# Patient Record
Sex: Female | Born: 1935 | Race: White | Hispanic: No | Marital: Married | Smoking: Never smoker
Health system: Southern US, Community
[De-identification: ages and names within clinical notes are randomized; demographics above are authoritative.]

## PROBLEM LIST (undated history)

## (undated) DIAGNOSIS — I10 Essential (primary) hypertension: Secondary | ICD-10-CM

## (undated) DIAGNOSIS — M1990 Unspecified osteoarthritis, unspecified site: Secondary | ICD-10-CM

## (undated) DIAGNOSIS — E785 Hyperlipidemia, unspecified: Secondary | ICD-10-CM

## (undated) HISTORY — PX: APPENDECTOMY: SHX54

## (undated) HISTORY — PX: TONSILLECTOMY: SUR1361

## (undated) HISTORY — DX: Hyperlipidemia, unspecified: E78.5

---

## 1999-01-03 ENCOUNTER — Encounter: Payer: Self-pay | Admitting: General Surgery

## 1999-01-03 ENCOUNTER — Ambulatory Visit (HOSPITAL_BASED_OUTPATIENT_CLINIC_OR_DEPARTMENT_OTHER): Admission: RE | Admit: 1999-01-03 | Discharge: 1999-01-03 | Payer: Self-pay | Admitting: General Surgery

## 2010-08-14 ENCOUNTER — Other Ambulatory Visit: Payer: Self-pay | Admitting: Family Medicine

## 2010-08-14 ENCOUNTER — Other Ambulatory Visit
Admission: RE | Admit: 2010-08-14 | Discharge: 2010-08-14 | Payer: Self-pay | Source: Home / Self Care | Admitting: Family Medicine

## 2011-08-08 DIAGNOSIS — G510 Bell's palsy: Secondary | ICD-10-CM | POA: Diagnosis not present

## 2011-08-22 DIAGNOSIS — G510 Bell's palsy: Secondary | ICD-10-CM | POA: Diagnosis not present

## 2011-09-11 DIAGNOSIS — Z1231 Encounter for screening mammogram for malignant neoplasm of breast: Secondary | ICD-10-CM | POA: Diagnosis not present

## 2011-09-14 DIAGNOSIS — M949 Disorder of cartilage, unspecified: Secondary | ICD-10-CM | POA: Diagnosis not present

## 2011-09-14 DIAGNOSIS — R7301 Impaired fasting glucose: Secondary | ICD-10-CM | POA: Diagnosis not present

## 2011-09-14 DIAGNOSIS — I10 Essential (primary) hypertension: Secondary | ICD-10-CM | POA: Diagnosis not present

## 2011-09-14 DIAGNOSIS — M839 Adult osteomalacia, unspecified: Secondary | ICD-10-CM | POA: Diagnosis not present

## 2011-09-14 DIAGNOSIS — M899 Disorder of bone, unspecified: Secondary | ICD-10-CM | POA: Diagnosis not present

## 2011-09-14 DIAGNOSIS — E559 Vitamin D deficiency, unspecified: Secondary | ICD-10-CM | POA: Diagnosis not present

## 2011-09-14 DIAGNOSIS — L2089 Other atopic dermatitis: Secondary | ICD-10-CM | POA: Diagnosis not present

## 2011-09-14 DIAGNOSIS — IMO0001 Reserved for inherently not codable concepts without codable children: Secondary | ICD-10-CM | POA: Diagnosis not present

## 2011-09-14 DIAGNOSIS — E782 Mixed hyperlipidemia: Secondary | ICD-10-CM | POA: Diagnosis not present

## 2011-09-18 DIAGNOSIS — I10 Essential (primary) hypertension: Secondary | ICD-10-CM | POA: Diagnosis not present

## 2011-09-18 DIAGNOSIS — Z1331 Encounter for screening for depression: Secondary | ICD-10-CM | POA: Diagnosis not present

## 2011-09-18 DIAGNOSIS — E782 Mixed hyperlipidemia: Secondary | ICD-10-CM | POA: Diagnosis not present

## 2011-09-18 DIAGNOSIS — E559 Vitamin D deficiency, unspecified: Secondary | ICD-10-CM | POA: Diagnosis not present

## 2011-09-18 DIAGNOSIS — R7301 Impaired fasting glucose: Secondary | ICD-10-CM | POA: Diagnosis not present

## 2011-09-18 DIAGNOSIS — Z0000 Encounter for general adult medical examination without abnormal findings: Secondary | ICD-10-CM | POA: Diagnosis not present

## 2011-09-18 DIAGNOSIS — M899 Disorder of bone, unspecified: Secondary | ICD-10-CM | POA: Diagnosis not present

## 2011-09-18 DIAGNOSIS — M949 Disorder of cartilage, unspecified: Secondary | ICD-10-CM | POA: Diagnosis not present

## 2011-11-13 DIAGNOSIS — Z23 Encounter for immunization: Secondary | ICD-10-CM | POA: Diagnosis not present

## 2012-04-03 DIAGNOSIS — Z23 Encounter for immunization: Secondary | ICD-10-CM | POA: Diagnosis not present

## 2012-04-10 DIAGNOSIS — M899 Disorder of bone, unspecified: Secondary | ICD-10-CM | POA: Diagnosis not present

## 2012-04-10 DIAGNOSIS — Z8262 Family history of osteoporosis: Secondary | ICD-10-CM | POA: Diagnosis not present

## 2012-09-10 DIAGNOSIS — E782 Mixed hyperlipidemia: Secondary | ICD-10-CM | POA: Diagnosis not present

## 2012-09-10 DIAGNOSIS — M899 Disorder of bone, unspecified: Secondary | ICD-10-CM | POA: Diagnosis not present

## 2012-09-10 DIAGNOSIS — I10 Essential (primary) hypertension: Secondary | ICD-10-CM | POA: Diagnosis not present

## 2012-09-10 DIAGNOSIS — Z1331 Encounter for screening for depression: Secondary | ICD-10-CM | POA: Diagnosis not present

## 2012-09-10 DIAGNOSIS — E559 Vitamin D deficiency, unspecified: Secondary | ICD-10-CM | POA: Diagnosis not present

## 2012-09-10 DIAGNOSIS — Z0000 Encounter for general adult medical examination without abnormal findings: Secondary | ICD-10-CM | POA: Diagnosis not present

## 2012-09-10 DIAGNOSIS — R7301 Impaired fasting glucose: Secondary | ICD-10-CM | POA: Diagnosis not present

## 2012-09-10 DIAGNOSIS — M949 Disorder of cartilage, unspecified: Secondary | ICD-10-CM | POA: Diagnosis not present

## 2012-09-18 DIAGNOSIS — E782 Mixed hyperlipidemia: Secondary | ICD-10-CM | POA: Diagnosis not present

## 2012-09-18 DIAGNOSIS — Z0000 Encounter for general adult medical examination without abnormal findings: Secondary | ICD-10-CM | POA: Diagnosis not present

## 2012-09-18 DIAGNOSIS — M899 Disorder of bone, unspecified: Secondary | ICD-10-CM | POA: Diagnosis not present

## 2012-09-18 DIAGNOSIS — I10 Essential (primary) hypertension: Secondary | ICD-10-CM | POA: Diagnosis not present

## 2012-09-18 DIAGNOSIS — R7301 Impaired fasting glucose: Secondary | ICD-10-CM | POA: Diagnosis not present

## 2012-09-18 DIAGNOSIS — M949 Disorder of cartilage, unspecified: Secondary | ICD-10-CM | POA: Diagnosis not present

## 2012-09-18 DIAGNOSIS — Z1211 Encounter for screening for malignant neoplasm of colon: Secondary | ICD-10-CM | POA: Diagnosis not present

## 2012-09-18 DIAGNOSIS — Z1331 Encounter for screening for depression: Secondary | ICD-10-CM | POA: Diagnosis not present

## 2012-09-18 DIAGNOSIS — E559 Vitamin D deficiency, unspecified: Secondary | ICD-10-CM | POA: Diagnosis not present

## 2012-09-18 DIAGNOSIS — Z01419 Encounter for gynecological examination (general) (routine) without abnormal findings: Secondary | ICD-10-CM | POA: Diagnosis not present

## 2012-12-29 DIAGNOSIS — Z0000 Encounter for general adult medical examination without abnormal findings: Secondary | ICD-10-CM | POA: Diagnosis not present

## 2012-12-29 DIAGNOSIS — Z1331 Encounter for screening for depression: Secondary | ICD-10-CM | POA: Diagnosis not present

## 2012-12-29 DIAGNOSIS — M899 Disorder of bone, unspecified: Secondary | ICD-10-CM | POA: Diagnosis not present

## 2012-12-29 DIAGNOSIS — R7301 Impaired fasting glucose: Secondary | ICD-10-CM | POA: Diagnosis not present

## 2012-12-29 DIAGNOSIS — M949 Disorder of cartilage, unspecified: Secondary | ICD-10-CM | POA: Diagnosis not present

## 2012-12-29 DIAGNOSIS — Z1211 Encounter for screening for malignant neoplasm of colon: Secondary | ICD-10-CM | POA: Diagnosis not present

## 2012-12-29 DIAGNOSIS — E782 Mixed hyperlipidemia: Secondary | ICD-10-CM | POA: Diagnosis not present

## 2012-12-29 DIAGNOSIS — E559 Vitamin D deficiency, unspecified: Secondary | ICD-10-CM | POA: Diagnosis not present

## 2012-12-29 DIAGNOSIS — I10 Essential (primary) hypertension: Secondary | ICD-10-CM | POA: Diagnosis not present

## 2012-12-30 DIAGNOSIS — H2510 Age-related nuclear cataract, unspecified eye: Secondary | ICD-10-CM | POA: Diagnosis not present

## 2013-04-07 DIAGNOSIS — Z23 Encounter for immunization: Secondary | ICD-10-CM | POA: Diagnosis not present

## 2013-09-16 ENCOUNTER — Inpatient Hospital Stay (HOSPITAL_COMMUNITY)
Admission: EM | Admit: 2013-09-16 | Discharge: 2013-09-20 | DRG: 482 | Disposition: A | Discharge status: Skilled Nursing Facility | Payer: Medicare Other | Attending: Internal Medicine | Admitting: Internal Medicine

## 2013-09-16 ENCOUNTER — Emergency Department (HOSPITAL_COMMUNITY): Payer: Medicare Other

## 2013-09-16 ENCOUNTER — Encounter (HOSPITAL_COMMUNITY): Payer: Self-pay | Admitting: Emergency Medicine

## 2013-09-16 DIAGNOSIS — M79609 Pain in unspecified limb: Secondary | ICD-10-CM | POA: Diagnosis not present

## 2013-09-16 DIAGNOSIS — E559 Vitamin D deficiency, unspecified: Secondary | ICD-10-CM | POA: Diagnosis not present

## 2013-09-16 DIAGNOSIS — W010XXA Fall on same level from slipping, tripping and stumbling without subsequent striking against object, initial encounter: Secondary | ICD-10-CM | POA: Diagnosis not present

## 2013-09-16 DIAGNOSIS — I10 Essential (primary) hypertension: Secondary | ICD-10-CM | POA: Diagnosis present

## 2013-09-16 DIAGNOSIS — S8990XA Unspecified injury of unspecified lower leg, initial encounter: Secondary | ICD-10-CM | POA: Diagnosis not present

## 2013-09-16 DIAGNOSIS — Z01811 Encounter for preprocedural respiratory examination: Secondary | ICD-10-CM | POA: Diagnosis not present

## 2013-09-16 DIAGNOSIS — E785 Hyperlipidemia, unspecified: Secondary | ICD-10-CM | POA: Diagnosis not present

## 2013-09-16 DIAGNOSIS — I498 Other specified cardiac arrhythmias: Secondary | ICD-10-CM | POA: Diagnosis not present

## 2013-09-16 DIAGNOSIS — W19XXXA Unspecified fall, initial encounter: Secondary | ICD-10-CM | POA: Diagnosis not present

## 2013-09-16 DIAGNOSIS — S7290XD Unspecified fracture of unspecified femur, subsequent encounter for closed fracture with routine healing: Secondary | ICD-10-CM | POA: Diagnosis not present

## 2013-09-16 DIAGNOSIS — I519 Heart disease, unspecified: Secondary | ICD-10-CM | POA: Diagnosis present

## 2013-09-16 DIAGNOSIS — M1990 Unspecified osteoarthritis, unspecified site: Secondary | ICD-10-CM | POA: Diagnosis not present

## 2013-09-16 DIAGNOSIS — Y92009 Unspecified place in unspecified non-institutional (private) residence as the place of occurrence of the external cause: Secondary | ICD-10-CM | POA: Diagnosis not present

## 2013-09-16 DIAGNOSIS — Z9181 History of falling: Secondary | ICD-10-CM | POA: Diagnosis not present

## 2013-09-16 DIAGNOSIS — I517 Cardiomegaly: Secondary | ICD-10-CM | POA: Diagnosis not present

## 2013-09-16 DIAGNOSIS — D72829 Elevated white blood cell count, unspecified: Secondary | ICD-10-CM | POA: Diagnosis not present

## 2013-09-16 DIAGNOSIS — S72309A Unspecified fracture of shaft of unspecified femur, initial encounter for closed fracture: Secondary | ICD-10-CM | POA: Diagnosis not present

## 2013-09-16 DIAGNOSIS — IMO0002 Reserved for concepts with insufficient information to code with codable children: Secondary | ICD-10-CM | POA: Diagnosis not present

## 2013-09-16 DIAGNOSIS — M818 Other osteoporosis without current pathological fracture: Secondary | ICD-10-CM | POA: Diagnosis not present

## 2013-09-16 DIAGNOSIS — S7292XA Unspecified fracture of left femur, initial encounter for closed fracture: Secondary | ICD-10-CM

## 2013-09-16 DIAGNOSIS — Z7982 Long term (current) use of aspirin: Secondary | ICD-10-CM | POA: Diagnosis not present

## 2013-09-16 DIAGNOSIS — S7291XA Unspecified fracture of right femur, initial encounter for closed fracture: Secondary | ICD-10-CM | POA: Diagnosis present

## 2013-09-16 DIAGNOSIS — S72009A Fracture of unspecified part of neck of unspecified femur, initial encounter for closed fracture: Secondary | ICD-10-CM | POA: Diagnosis not present

## 2013-09-16 DIAGNOSIS — I4510 Unspecified right bundle-branch block: Secondary | ICD-10-CM | POA: Diagnosis not present

## 2013-09-16 DIAGNOSIS — S99919A Unspecified injury of unspecified ankle, initial encounter: Secondary | ICD-10-CM | POA: Diagnosis not present

## 2013-09-16 DIAGNOSIS — S7290XA Unspecified fracture of unspecified femur, initial encounter for closed fracture: Secondary | ICD-10-CM | POA: Diagnosis not present

## 2013-09-16 DIAGNOSIS — T148XXA Other injury of unspecified body region, initial encounter: Secondary | ICD-10-CM | POA: Diagnosis not present

## 2013-09-16 DIAGNOSIS — M6281 Muscle weakness (generalized): Secondary | ICD-10-CM | POA: Diagnosis not present

## 2013-09-16 DIAGNOSIS — R269 Unspecified abnormalities of gait and mobility: Secondary | ICD-10-CM | POA: Diagnosis not present

## 2013-09-16 DIAGNOSIS — M25559 Pain in unspecified hip: Secondary | ICD-10-CM | POA: Diagnosis not present

## 2013-09-16 DIAGNOSIS — Z4789 Encounter for other orthopedic aftercare: Secondary | ICD-10-CM | POA: Diagnosis not present

## 2013-09-16 HISTORY — DX: Essential (primary) hypertension: I10

## 2013-09-16 HISTORY — DX: Unspecified osteoarthritis, unspecified site: M19.90

## 2013-09-16 LAB — I-STAT CHEM 8, ED
BUN: 22 mg/dL (ref 6–23)
Calcium, Ion: 1.06 mmol/L — ABNORMAL LOW (ref 1.13–1.30)
Chloride: 103 mEq/L (ref 96–112)
Creatinine, Ser: 1 mg/dL (ref 0.50–1.10)
Glucose, Bld: 153 mg/dL — ABNORMAL HIGH (ref 70–99)
HCT: 45 % (ref 36.0–46.0)
Hemoglobin: 15.3 g/dL — ABNORMAL HIGH (ref 12.0–15.0)
Potassium: 4 mEq/L (ref 3.7–5.3)
SODIUM: 141 meq/L (ref 137–147)
TCO2: 27 mmol/L (ref 0–100)

## 2013-09-16 MED ORDER — FENTANYL CITRATE 0.05 MG/ML IJ SOLN
100.0000 ug | Freq: Once | INTRAMUSCULAR | Status: AC
  Administered 2013-09-16: 100 ug via INTRAVENOUS
  Filled 2013-09-16: qty 2

## 2013-09-16 NOTE — ED Notes (Signed)
Bed: [id number] Expected date:  Expected time:  Means of arrival:  Comments: EMS 46F Fall w/ Fracture

## 2013-09-16 NOTE — ED Provider Notes (Signed)
CSN: [id number]     Arrival date & time 09/16/13  2300 History   First MD Initiated Contact with Patient 09/16/13 2302     Chief Complaint  Patient presents with  . Fall  . Leg Injury    Left femur deformity     (Consider location/radiation/quality/duration/timing/severity/associated sxs/prior Treatment) Patient is a 78 y.o. female presenting with hip pain. The history is provided by the patient. No language interpreter was used.  Hip Pain This is a new problem. The current episode started less than 1 hour ago. The problem occurs constantly. The problem has not changed since onset.Pertinent negatives include no chest pain, no abdominal pain, no headaches and no shortness of breath. Nothing aggravates the symptoms. Nothing relieves the symptoms. She has tried nothing for the symptoms. The treatment provided no relief.    History reviewed. No pertinent past medical history. History reviewed. No pertinent past surgical history. History reviewed. No pertinent family history. History  Substance Use Topics  . Smoking status: Not on file  . Smokeless tobacco: Not on file  . Alcohol Use: No   OB History   Grav Para Term Preterm Abortions TAB SAB Ect Mult Living                 Review of Systems  Respiratory: Negative for shortness of breath.   Cardiovascular: Negative for chest pain.  Gastrointestinal: Negative for abdominal pain.  Neurological: Negative for headaches.  All other systems reviewed and are negative.      Allergies  Review of patient's allergies indicates not on file.  Home Medications  No current outpatient prescriptions on file. BP 162/87  Pulse 115  Temp(Src) 98.6 F (37 C) (Oral)  Resp 22  SpO2 99% Physical Exam  Constitutional: She is oriented to person, place, and time. She appears well-developed and well-nourished.  HENT:  Head: Normocephalic and atraumatic.  Mouth/Throat: Oropharynx is clear and moist.  Eyes: Conjunctivae are normal. Pupils are  equal, round, and reactive to light.  Neck: Normal range of motion. Neck supple.  Cardiovascular: Normal rate, regular rhythm and intact distal pulses.   Pulmonary/Chest: Effort normal and breath sounds normal. No respiratory distress. She has no wheezes. She has no rales.  Abdominal: Soft. Bowel sounds are normal. There is no tenderness. There is no rebound and no guarding.  Musculoskeletal: Normal range of motion.  Neurological: She is alert and oriented to person, place, and time.  Skin: Skin is warm and dry. She is not diaphoretic.  Psychiatric: She has a normal mood and affect.    ED Course  Procedures (including critical care time) Labs Review Labs Reviewed - No data to display Imaging Review No results found.  EKG Interpretation   None       MDM   Final diagnoses:  None     Date: 09/16/2013  Rate: 113  Rhythm: sinus tachycardia  QRS Axis: normal  Intervals: QT prolonged  ST/T Wave abnormalities: nonspecific ST changes  Conduction Disutrbances:right bundle branch block  Narrative Interpretation:   Old EKG Reviewed: none available    1230 am case d/w Dr. Charlann Boxerlin of orthopedics via phone.  Continue [place] traction, admit to medicine, muscle relaxants and pain meds keep NPO for OR in am.    Alejos Reinhardt Smitty CordsK Spring San-Rasch, MD 09/17/13 [id number]

## 2013-09-16 NOTE — ED Notes (Signed)
MD at bedside. 

## 2013-09-16 NOTE — ED Notes (Signed)
Patient arrives via EMS r/t falling in her living room Patient with obvious deformity to left femur (mid-shaft) Traction splint placed by EMS Patient on back board to assist in moving No LOC

## 2013-09-17 ENCOUNTER — Encounter (HOSPITAL_COMMUNITY): Payer: Medicare Other | Admitting: Anesthesiology

## 2013-09-17 ENCOUNTER — Encounter (HOSPITAL_COMMUNITY): Payer: Self-pay | Admitting: *Deleted

## 2013-09-17 ENCOUNTER — Encounter (HOSPITAL_COMMUNITY)
Admission: EM | Disposition: A | Discharge status: Skilled Nursing Facility | Payer: Self-pay | Source: Home / Self Care | Attending: Internal Medicine

## 2013-09-17 ENCOUNTER — Observation Stay (HOSPITAL_COMMUNITY): Payer: Medicare Other

## 2013-09-17 ENCOUNTER — Observation Stay (HOSPITAL_COMMUNITY): Payer: Medicare Other | Admitting: Anesthesiology

## 2013-09-17 DIAGNOSIS — Y92009 Unspecified place in unspecified non-institutional (private) residence as the place of occurrence of the external cause: Secondary | ICD-10-CM

## 2013-09-17 DIAGNOSIS — I517 Cardiomegaly: Secondary | ICD-10-CM

## 2013-09-17 DIAGNOSIS — W19XXXA Unspecified fall, initial encounter: Secondary | ICD-10-CM

## 2013-09-17 DIAGNOSIS — S7291XA Unspecified fracture of right femur, initial encounter for closed fracture: Secondary | ICD-10-CM | POA: Diagnosis present

## 2013-09-17 DIAGNOSIS — S7290XA Unspecified fracture of unspecified femur, initial encounter for closed fracture: Secondary | ICD-10-CM

## 2013-09-17 DIAGNOSIS — I4510 Unspecified right bundle-branch block: Secondary | ICD-10-CM | POA: Diagnosis present

## 2013-09-17 HISTORY — PX: ORIF FEMUR FRACTURE: SHX2119

## 2013-09-17 LAB — TYPE AND SCREEN
ABO/RH(D): A NEG
Antibody Screen: NEGATIVE

## 2013-09-17 LAB — CBC WITH DIFFERENTIAL/PLATELET
BASOS ABS: 0 10*3/uL (ref 0.0–0.1)
Basophils Relative: 0 % (ref 0–1)
Eosinophils Absolute: 0.2 10*3/uL (ref 0.0–0.7)
Eosinophils Relative: 2 % (ref 0–5)
HEMATOCRIT: 42.2 % (ref 36.0–46.0)
Hemoglobin: 14.4 g/dL (ref 12.0–15.0)
LYMPHS ABS: 2 10*3/uL (ref 0.7–4.0)
LYMPHS PCT: 17 % (ref 12–46)
MCH: 30.7 pg (ref 26.0–34.0)
MCHC: 34.1 g/dL (ref 30.0–36.0)
MCV: 90 fL (ref 78.0–100.0)
MONO ABS: 0.6 10*3/uL (ref 0.1–1.0)
Monocytes Relative: 5 % (ref 3–12)
Neutro Abs: 9.2 10*3/uL — ABNORMAL HIGH (ref 1.7–7.7)
Neutrophils Relative %: 76 % (ref 43–77)
PLATELETS: 264 10*3/uL (ref 150–400)
RBC: 4.69 MIL/uL (ref 3.87–5.11)
RDW: 12.8 % (ref 11.5–15.5)
WBC: 12 10*3/uL — AB (ref 4.0–10.5)

## 2013-09-17 LAB — SURGICAL PCR SCREEN
MRSA, PCR: NEGATIVE
Staphylococcus aureus: NEGATIVE

## 2013-09-17 LAB — CREATININE, SERUM
CREATININE: 0.81 mg/dL (ref 0.50–1.10)
GFR calc Af Amer: 80 mL/min — ABNORMAL LOW (ref >90)
GFR, EST NON AFRICAN AMERICAN: 69 mL/min — AB (ref >90)

## 2013-09-17 LAB — ABO/RH: ABO/RH(D): A NEG

## 2013-09-17 LAB — PROTIME-INR
INR: 0.87 (ref 0.00–1.49)
PROTHROMBIN TIME: 11.7 s (ref 11.6–15.2)

## 2013-09-17 LAB — CBC
HEMATOCRIT: 38.2 % (ref 36.0–46.0)
HEMOGLOBIN: 12.4 g/dL (ref 12.0–15.0)
MCH: 30 pg (ref 26.0–34.0)
MCHC: 32.5 g/dL (ref 30.0–36.0)
MCV: 92.3 fL (ref 78.0–100.0)
Platelets: 233 10*3/uL (ref 150–400)
RBC: 4.14 MIL/uL (ref 3.87–5.11)
RDW: 13.1 % (ref 11.5–15.5)
WBC: 8.6 10*3/uL (ref 4.0–10.5)

## 2013-09-17 SURGERY — OPEN REDUCTION INTERNAL FIXATION (ORIF) DISTAL FEMUR FRACTURE
Anesthesia: General | Site: Leg Upper | Laterality: Left

## 2013-09-17 MED ORDER — HYDROCODONE-ACETAMINOPHEN 5-325 MG PO TABS
1.0000 | ORAL_TABLET | Freq: Four times a day (QID) | ORAL | Status: DC | PRN
  Filled 2013-09-17 (×6): qty 2

## 2013-09-17 MED ORDER — POTASSIUM CHLORIDE 2 MEQ/ML IV SOLN
INTRAVENOUS | Status: DC
  Administered 2013-09-17 – 2013-09-18 (×2): via INTRAVENOUS
  Filled 2013-09-17 (×8): qty 1000

## 2013-09-17 MED ORDER — HYDROMORPHONE HCL PF 1 MG/ML IJ SOLN
1.0000 mg | Freq: Once | INTRAMUSCULAR | Status: AC
  Administered 2013-09-17: 1 mg via INTRAVENOUS
  Filled 2013-09-17: qty 1

## 2013-09-17 MED ORDER — METOCLOPRAMIDE HCL 10 MG PO TABS: 5.0000 mg | ORAL_TABLET | Freq: Three times a day (TID) | ORAL | Status: DC | PRN

## 2013-09-17 MED ORDER — METHOCARBAMOL 500 MG PO TABS: 500.0000 mg | ORAL_TABLET | Freq: Four times a day (QID) | ORAL | Status: DC | PRN

## 2013-09-17 MED ORDER — DEXAMETHASONE SODIUM PHOSPHATE 10 MG/ML IJ SOLN
INTRAMUSCULAR | Status: DC | PRN
  Administered 2013-09-17: 10 mg via INTRAVENOUS

## 2013-09-17 MED ORDER — HYDROMORPHONE HCL PF 2 MG/ML IJ SOLN
INTRAMUSCULAR | Status: AC
  Filled 2013-09-17: qty 1

## 2013-09-17 MED ORDER — PHENYLEPHRINE 40 MCG/ML (10ML) SYRINGE FOR IV PUSH (FOR BLOOD PRESSURE SUPPORT)
PREFILLED_SYRINGE | INTRAVENOUS | Status: AC
  Filled 2013-09-17: qty 10

## 2013-09-17 MED ORDER — METHOCARBAMOL 100 MG/ML IJ SOLN
1000.0000 mg | Freq: Once | INTRAMUSCULAR | Status: AC
  Administered 2013-09-17: 1000 mg via INTRAMUSCULAR
  Filled 2013-09-17: qty 10

## 2013-09-17 MED ORDER — DOCUSATE SODIUM 100 MG PO CAPS
100.0000 mg | ORAL_CAPSULE | Freq: Two times a day (BID) | ORAL | Status: DC
  Administered 2013-09-17 – 2013-09-20 (×6): 100 mg via ORAL

## 2013-09-17 MED ORDER — HYDROCODONE-ACETAMINOPHEN 5-325 MG PO TABS: 1.0000 | ORAL_TABLET | Freq: Four times a day (QID) | ORAL | Status: DC | PRN

## 2013-09-17 MED ORDER — MEPERIDINE HCL 50 MG/ML IJ SOLN: 6.2500 mg | INTRAMUSCULAR | Status: DC | PRN

## 2013-09-17 MED ORDER — HYDROMORPHONE HCL PF 1 MG/ML IJ SOLN
INTRAMUSCULAR | Status: DC | PRN
  Administered 2013-09-17: 1 mg via INTRAVENOUS

## 2013-09-17 MED ORDER — ACETAMINOPHEN 325 MG PO TABS: 650.0000 mg | ORAL_TABLET | Freq: Four times a day (QID) | ORAL | Status: DC | PRN

## 2013-09-17 MED ORDER — ALUM & MAG HYDROXIDE-SIMETH 200-200-20 MG/5ML PO SUSP: 30.0000 mL | ORAL | Status: DC | PRN

## 2013-09-17 MED ORDER — CEFAZOLIN SODIUM-DEXTROSE 2-3 GM-% IV SOLR
INTRAVENOUS | Status: AC
  Filled 2013-09-17: qty 50

## 2013-09-17 MED ORDER — LIDOCAINE HCL (PF) 2 % IJ SOLN
INTRAMUSCULAR | Status: DC | PRN
  Administered 2013-09-17: 20 mg via INTRADERMAL

## 2013-09-17 MED ORDER — DEXAMETHASONE SODIUM PHOSPHATE 10 MG/ML IJ SOLN
INTRAMUSCULAR | Status: AC
  Filled 2013-09-17: qty 1

## 2013-09-17 MED ORDER — LACTATED RINGERS IV SOLN: INTRAVENOUS | Status: DC

## 2013-09-17 MED ORDER — LACTATED RINGERS IV SOLN
INTRAVENOUS | Status: DC | PRN
  Administered 2013-09-17: 12:00:00 via INTRAVENOUS

## 2013-09-17 MED ORDER — FENTANYL CITRATE 0.05 MG/ML IJ SOLN
INTRAMUSCULAR | Status: AC
  Filled 2013-09-17: qty 2

## 2013-09-17 MED ORDER — FENTANYL CITRATE 0.05 MG/ML IJ SOLN: 25.0000 ug | INTRAMUSCULAR | Status: DC | PRN

## 2013-09-17 MED ORDER — MAGNESIUM CITRATE PO SOLN: 0.5000 | Freq: Once | ORAL | Status: AC | PRN

## 2013-09-17 MED ORDER — SUCCINYLCHOLINE CHLORIDE 20 MG/ML IJ SOLN
INTRAMUSCULAR | Status: DC | PRN
  Administered 2013-09-17: 100 mg via INTRAVENOUS

## 2013-09-17 MED ORDER — METOCLOPRAMIDE HCL 5 MG/ML IJ SOLN: 5.0000 mg | Freq: Three times a day (TID) | INTRAMUSCULAR | Status: DC | PRN

## 2013-09-17 MED ORDER — ONDANSETRON HCL 4 MG/2ML IJ SOLN: 4.0000 mg | Freq: Four times a day (QID) | INTRAMUSCULAR | Status: DC | PRN

## 2013-09-17 MED ORDER — ENOXAPARIN SODIUM 40 MG/0.4ML ~~LOC~~ SOLN
40.0000 mg | SUBCUTANEOUS | Status: DC
  Administered 2013-09-18 – 2013-09-20 (×3): 40 mg via SUBCUTANEOUS
  Filled 2013-09-17 (×4): qty 0.4

## 2013-09-17 MED ORDER — PROPOFOL 10 MG/ML IV BOLUS
INTRAVENOUS | Status: AC
  Filled 2013-09-17: qty 20

## 2013-09-17 MED ORDER — ONDANSETRON HCL 4 MG/2ML IJ SOLN
INTRAMUSCULAR | Status: DC | PRN
Start: 2013-09-17 — End: 2013-09-17
  Administered 2013-09-17: 4 mg via INTRAVENOUS

## 2013-09-17 MED ORDER — PROMETHAZINE HCL 25 MG/ML IJ SOLN: 6.2500 mg | INTRAMUSCULAR | Status: DC | PRN

## 2013-09-17 MED ORDER — HYDROMORPHONE HCL PF 1 MG/ML IJ SOLN
0.5000 mg | INTRAMUSCULAR | Status: DC | PRN
  Administered 2013-09-18: 1 mg via INTRAVENOUS
  Filled 2013-09-17: qty 1

## 2013-09-17 MED ORDER — METHOCARBAMOL 100 MG/ML IJ SOLN: 500.0000 mg | Freq: Four times a day (QID) | INTRAVENOUS | Status: DC | PRN

## 2013-09-17 MED ORDER — DEXAMETHASONE SODIUM PHOSPHATE 10 MG/ML IJ SOLN
10.0000 mg | Freq: Once | INTRAMUSCULAR | Status: AC
  Administered 2013-09-18: 10 mg via INTRAVENOUS
  Filled 2013-09-17: qty 1

## 2013-09-17 MED ORDER — PHENOL 1.4 % MT LIQD: 1.0000 | OROMUCOSAL | Status: DC | PRN

## 2013-09-17 MED ORDER — PROPOFOL 10 MG/ML IV BOLUS
INTRAVENOUS | Status: DC | PRN
  Administered 2013-09-17: 150 mg via INTRAVENOUS

## 2013-09-17 MED ORDER — CEFAZOLIN SODIUM-DEXTROSE 2-3 GM-% IV SOLR
INTRAVENOUS | Status: DC | PRN
  Administered 2013-09-17: 2 g via INTRAVENOUS

## 2013-09-17 MED ORDER — METHOCARBAMOL 100 MG/ML IJ SOLN
500.0000 mg | Freq: Four times a day (QID) | INTRAVENOUS | Status: DC | PRN
  Filled 2013-09-17: qty 5

## 2013-09-17 MED ORDER — MIDAZOLAM HCL 5 MG/5ML IJ SOLN
INTRAMUSCULAR | Status: DC | PRN
  Administered 2013-09-17: 2 mg via INTRAVENOUS

## 2013-09-17 MED ORDER — ONDANSETRON HCL 4 MG PO TABS: 4.0000 mg | ORAL_TABLET | Freq: Four times a day (QID) | ORAL | Status: DC | PRN

## 2013-09-17 MED ORDER — PHENYLEPHRINE HCL 10 MG/ML IJ SOLN
INTRAMUSCULAR | Status: DC | PRN
  Administered 2013-09-17 (×2): 80 ug via INTRAVENOUS

## 2013-09-17 MED ORDER — HYDROCODONE-ACETAMINOPHEN 5-325 MG PO TABS
1.0000 | ORAL_TABLET | Freq: Four times a day (QID) | ORAL | Status: DC | PRN
  Administered 2013-09-17: 1 via ORAL
  Administered 2013-09-18 – 2013-09-20 (×6): 2 via ORAL
  Filled 2013-09-17: qty 2

## 2013-09-17 MED ORDER — MIDAZOLAM HCL 2 MG/2ML IJ SOLN
INTRAMUSCULAR | Status: AC
  Filled 2013-09-17: qty 2

## 2013-09-17 MED ORDER — ENOXAPARIN SODIUM 40 MG/0.4ML ~~LOC~~ SOLN: 40.0000 mg | SUBCUTANEOUS | Status: DC

## 2013-09-17 MED ORDER — POLYETHYLENE GLYCOL 3350 17 G PO PACK
17.0000 g | PACK | Freq: Every day | ORAL | Status: DC | PRN
  Administered 2013-09-17: 17 g via ORAL

## 2013-09-17 MED ORDER — CEFAZOLIN SODIUM 1-5 GM-% IV SOLN
1.0000 g | Freq: Four times a day (QID) | INTRAVENOUS | Status: AC
  Administered 2013-09-17 – 2013-09-18 (×2): 1 g via INTRAVENOUS
  Filled 2013-09-17 (×2): qty 50

## 2013-09-17 MED ORDER — 0.9 % SODIUM CHLORIDE (POUR BTL) OPTIME
TOPICAL | Status: DC | PRN
  Administered 2013-09-17: 1000 mL

## 2013-09-17 MED ORDER — SODIUM CHLORIDE 0.9 % IV SOLN
INTRAVENOUS | Status: DC
  Administered 2013-09-17: 05:00:00 via INTRAVENOUS

## 2013-09-17 MED ORDER — MENTHOL 3 MG MT LOZG: 1.0000 | LOZENGE | OROMUCOSAL | Status: DC | PRN

## 2013-09-17 MED ORDER — CEFAZOLIN SODIUM-DEXTROSE 2-3 GM-% IV SOLR: 2.0000 g | Freq: Once | INTRAVENOUS | Status: DC

## 2013-09-17 MED ORDER — ACETAMINOPHEN 650 MG RE SUPP: 650.0000 mg | Freq: Four times a day (QID) | RECTAL | Status: DC | PRN

## 2013-09-17 MED ORDER — ONDANSETRON HCL 4 MG/2ML IJ SOLN
INTRAMUSCULAR | Status: AC
  Filled 2013-09-17: qty 2

## 2013-09-17 MED ORDER — FENTANYL CITRATE 0.05 MG/ML IJ SOLN
INTRAMUSCULAR | Status: DC | PRN
  Administered 2013-09-17: 100 ug via INTRAVENOUS

## 2013-09-17 MED ORDER — MORPHINE SULFATE 2 MG/ML IJ SOLN
0.5000 mg | INTRAMUSCULAR | Status: DC | PRN
  Administered 2013-09-17 (×2): 0.5 mg via INTRAVENOUS
  Filled 2013-09-17 (×2): qty 1

## 2013-09-17 SURGICAL SUPPLY — 48 items
ADH SKN CLS APL DERMABOND .7 (GAUZE/BANDAGES/DRESSINGS) ×1
BAG SPEC THK2 15X12 ZIP CLS (MISCELLANEOUS)
BAG ZIPLOCK 12X15 (MISCELLANEOUS) ×1 IMPLANT
BIT DRILL 4.3MMS DISTAL GRDTED (BIT) IMPLANT
BIT DRILL 5.3 (BIT) ×2 IMPLANT
BNDG COHESIVE 4X5 TAN STRL (GAUZE/BANDAGES/DRESSINGS) ×1 IMPLANT
BNDG GAUZE ELAST 4 BULKY (GAUZE/BANDAGES/DRESSINGS) ×1 IMPLANT
CLOSURE WOUND 1/2 X4 (GAUZE/BANDAGES/DRESSINGS) ×1
DERMABOND ADVANCED (GAUZE/BANDAGES/DRESSINGS) ×2
DERMABOND ADVANCED .7 DNX12 (GAUZE/BANDAGES/DRESSINGS) IMPLANT
DRILL 4.3MMS DISTAL GRADUATED (BIT) ×3
DRSG EMULSION OIL 3X16 NADH (GAUZE/BANDAGES/DRESSINGS) ×1 IMPLANT
DRSG MEPILEX BORDER 4X4 (GAUZE/BANDAGES/DRESSINGS) ×2 IMPLANT
DRSG MEPILEX BORDER 4X8 (GAUZE/BANDAGES/DRESSINGS) ×2 IMPLANT
DURAPREP 26ML APPLICATOR (WOUND CARE) ×3 IMPLANT
ELECT REM PT RETURN 9FT ADLT (ELECTROSURGICAL) ×3
ELECTRODE REM PT RTRN 9FT ADLT (ELECTROSURGICAL) ×1 IMPLANT
GLOVE BIOGEL PI IND STRL 7.5 (GLOVE) ×1 IMPLANT
GLOVE BIOGEL PI IND STRL 8 (GLOVE) ×1 IMPLANT
GLOVE BIOGEL PI INDICATOR 7.5 (GLOVE)
GLOVE BIOGEL PI INDICATOR 8 (GLOVE) ×2
GLOVE ECLIPSE 8.0 STRL XLNG CF (GLOVE) IMPLANT
GLOVE ORTHO TXT STRL SZ7.5 (GLOVE) ×6 IMPLANT
GLOVE SURG ORTHO 8.0 STRL STRW (GLOVE) ×1 IMPLANT
GOWN SPEC L3 XXLG W/TWL (GOWN DISPOSABLE) ×4 IMPLANT
GOWN STRL REUS W/TWL LRG LVL3 (GOWN DISPOSABLE) ×3 IMPLANT
GUIDEPIN 3.2X17.5 THRD DISP (PIN) ×2 IMPLANT
GUIDEWIRE BALL NOSE 100CM (WIRE) ×2 IMPLANT
KIT BASIN OR (CUSTOM PROCEDURE TRAY) ×3 IMPLANT
MANIFOLD NEPTUNE II (INSTRUMENTS) ×3 IMPLANT
NAIL TROCH ENTRY 9MMX36CM (Nail) ×2 IMPLANT
NS IRRIG 1000ML POUR BTL (IV SOLUTION) ×3 IMPLANT
PACK TOTAL JOINT (CUSTOM PROCEDURE TRAY) ×3 IMPLANT
POSITIONER SURGICAL ARM (MISCELLANEOUS) ×3 IMPLANT
SCREW ACE CORTICAL (Screw) ×3 IMPLANT
SCREW BN FT 75X6.5XST DRV (Screw) IMPLANT
SCREW BONE CORTICAL 5.0X42 (Screw) ×2 IMPLANT
SCREWDRIVER HEX TIP 3.5MM (MISCELLANEOUS) ×2 IMPLANT
SPONGE GAUZE 4X4 12PLY (GAUZE/BANDAGES/DRESSINGS) ×1 IMPLANT
STAPLER VISISTAT 35W (STAPLE) ×1 IMPLANT
STRIP CLOSURE SKIN 1/2X4 (GAUZE/BANDAGES/DRESSINGS) ×2 IMPLANT
SUT MNCRL AB 4-0 PS2 18 (SUTURE) ×1 IMPLANT
SUT VIC AB 1 CT1 36 (SUTURE) ×4 IMPLANT
SUT VIC AB 2-0 CT1 27 (SUTURE) ×6
SUT VIC AB 2-0 CT1 TAPERPNT 27 (SUTURE) ×2 IMPLANT
TOWEL OR 17X26 10 PK STRL BLUE (TOWEL DISPOSABLE) ×4 IMPLANT
TOWEL OR NON WOVEN STRL DISP B (DISPOSABLE) ×2 IMPLANT
WATER STERILE IRR 1500ML POUR (IV SOLUTION) ×1 IMPLANT

## 2013-09-17 NOTE — Transfer of Care (Signed)
Immediate Anesthesia Transfer of Care Note  Patient: Lisa SpanBarbara White  Procedure(s) Performed: Procedure(s) (LRB): OPEN REDUCTION INTERNAL FIXATION (ORIF) DISTAL FEMUR FRACTURE (Left)  Patient Location: PACU  Anesthesia Type: General  Level of Consciousness: sedated, patient cooperative and responds to stimulation  Airway & Oxygen Therapy: Patient Spontanous Breathing and Patient connected to face mask oxgen  Post-op Assessment: Report given to PACU RN and Post -op Vital signs reviewed and stable  Post vital signs: Reviewed and stable  Complications: No apparent anesthesia complications

## 2013-09-17 NOTE — ED Notes (Signed)
Patient back from radiology Ortho tech present to place patient in ortho bed with traction

## 2013-09-17 NOTE — ED Notes (Signed)
EDP at bedside  Patient and pt's husband made aware that she is to be NPO for surgery later on this AM--agree and v/u

## 2013-09-17 NOTE — Preoperative (Signed)
Beta Blockers   Reason not to administer Beta Blockers:Not Applicable 

## 2013-09-17 NOTE — Brief Op Note (Signed)
09/16/2013 - 09/17/2013  2:20 PM  PATIENT:  Lisa White  78 y.o. female  PRE-OPERATIVE DIAGNOSIS:  Left femur fracture, mid shaft   POST-OPERATIVE DIAGNOSIS:  Left femur fracture, mid shaft   PROCEDURE:  Procedure(s): OPEN REDUCTION INTERNAL FIXATION (ORIF) DISTAL FEMUR FRACTURE (Left) Intramedullary nail left femur, 9X3960mm, one proximal interlock, one distal interlock  SURGEON:  Surgeon(s) and Role:    * Shelda PalMatthew D Regan Mcbryar, MD - Primary  PHYSICIAN ASSISTANT: None  ASSISTANTS: Surgical team   ANESTHESIA:   general  EBL:  Total I/O In: 167.5 [I.V.:167.5] Out: 1100 [Urine:1000; Blood:100]  BLOOD ADMINISTERED:none  DRAINS: none   LOCAL MEDICATIONS USED:  NONE  SPECIMEN:  No Specimen  DISPOSITION OF SPECIMEN:  N/A  COUNTS:  YES  TOURNIQUET:  * No tourniquets in log *  DICTATION: .Other Dictation: Dictation Number [id number]  PLAN OF CARE: Admit to inpatient   PATIENT DISPOSITION:  PACU - hemodynamically stable.   Delay start of Pharmacological VTE agent (>24hrs) due to surgical blood loss or risk of bleeding: no

## 2013-09-17 NOTE — Progress Notes (Signed)
Echo Lab  2D Echocardiogram completed.  Lisa White L Tadd Holtmeyer, RDCS 09/17/2013 9:01 AM

## 2013-09-17 NOTE — Anesthesia Preprocedure Evaluation (Addendum)
Anesthesia Evaluation  Patient identified by MRN, date of birth, ID band Patient awake    Reviewed: Allergy & Precautions, H&P , NPO status , Patient's Chart, lab work & pertinent test results  Airway Mallampati: II TM Distance: >3 FB Neck ROM: Full    Dental no notable dental hx.    Pulmonary neg pulmonary ROS,  breath sounds clear to auscultation  Pulmonary exam normal       Cardiovascular hypertension, Pt. on medications + dysrhythmias Rhythm:Regular Rate:Tachycardia     Neuro/Psych negative neurological ROS  negative psych ROS   GI/Hepatic negative GI ROS, Neg liver ROS,   Endo/Other  negative endocrine ROS  Renal/GU negative Renal ROS     Musculoskeletal negative musculoskeletal ROS (+)   Abdominal   Peds  Hematology negative hematology ROS (+)   Anesthesia Other Findings   Reproductive/Obstetrics negative OB ROS                          Anesthesia Physical Anesthesia Plan  ASA: III  Anesthesia Plan: General   Post-op Pain Management:    Induction: Intravenous  Airway Management Planned: Oral ETT  Additional Equipment:   Intra-op Plan:   Post-operative Plan: Extubation in OR  Informed Consent: I have reviewed the patients History and Physical, chart, labs and discussed the procedure including the risks, benefits and alternatives for the proposed anesthesia with the patient or authorized representative who has indicated his/her understanding and acceptance.   Dental advisory given  Plan Discussed with: CRNA and Surgeon  Anesthesia Plan Comments:        Anesthesia Quick Evaluation

## 2013-09-17 NOTE — H&P (Signed)
PCP:   No primary provider on file.   Chief Complaint:  Left hip pain  HPI: 78 yo female who was trying to tie her shoe, twisted and fell and hurt her left leg.  She has broken her left femur.  Pt denies loc.  She is overall very healthy.  No cardiac issues.  No h/o cva or chf.  No copd.  Very active.  Pain well controlled at this time.  Review of Systems:  Positive and negative as per HPI otherwise all other systems are negative  Past Medical History: History reviewed. No pertinent past medical history. History reviewed. No pertinent past surgical history.  Medications: Prior to Admission medications   Medication Sig Start Date End Date Taking? Authorizing Provider  alendronate (FOSAMAX) 70 MG tablet Take 70 mg by mouth once a week. 07/23/13  Yes Historical Provider, MD  amLODipine (NORVASC) 10 MG tablet Take 10 mg by mouth at bedtime.    Yes Historical Provider, MD  aspirin EC 81 MG tablet Take 81 mg by mouth at bedtime.    Yes Historical Provider, MD  atorvastatin (LIPITOR) 20 MG tablet Take 20 mg by mouth at bedtime.  08/13/13  Yes Historical Provider, MD    Allergies:  No Known Allergies  Social History:  reports that she does not drink alcohol or use illicit drugs. Her tobacco history is not on file.  Family History: History reviewed. No pertinent family history.  Physical Exam: Filed Vitals:   09/17/13 0215 09/17/13 0230 09/17/13 0239 09/17/13 0318  BP:  128/60  173/78  Pulse: 101 95 84 102  Temp:    98 F (36.7 C)  TempSrc:      Resp: 13 26 16 16   Height:    5\' 5"  (1.651 m)  Weight:    74.3 kg (163 lb 12.8 oz)  SpO2: 98% 94% 99% 94%   General appearance: alert, cooperative and no distress Head: Normocephalic, without obvious abnormality, atraumatic Eyes: negative Nose: Nares normal. Septum midline. Mucosa normal. No drainage or sinus tenderness. Neck: no JVD and supple, symmetrical, trachea midline Lungs: clear to auscultation bilaterally Heart: regular  rate and rhythm, S1, S2 normal, no murmur, click, rub or gallop Abdomen: soft, non-tender; bowel sounds normal; no masses,  no organomegaly Extremities: extremities normal, atraumatic, no cyanosis or edema  lle traction Pulses: 2+ and symmetric Skin: Skin color, texture, turgor normal. No rashes or lesions Neurologic: Grossly normal    Labs on Admission:   Recent Labs  09/16/13 2350  NA 141  K 4.0  CL 103  GLUCOSE 153*  BUN 22  CREATININE 1.00    Recent Labs  09/16/13 2325 09/16/13 2350  WBC 12.0*  --   NEUTROABS 9.2*  --   HGB 14.4 15.3*  HCT 42.2 45.0  MCV 90.0  --   PLT 264  --     Radiological Exams on Admission: Dg Chest 1 View  09/17/2013   CLINICAL DATA:  78 year old female with left femur fracture. Preoperative respiratory examination.  EXAM: CHEST - 1 VIEW  COMPARISON:  None.  FINDINGS: The heart size and mediastinal contours are within normal limits. Both lungs are clear. The visualized skeletal structures are unremarkable.  IMPRESSION: No active disease.   Electronically Signed   By: Laveda AbbeJeff  Hu M.D.   On: 09/17/2013 00:53   Dg Hip Complete Left  09/17/2013   CLINICAL DATA:  Left leg injury and pain.  EXAM: LEFT HIP - COMPLETE 2+ VIEW  COMPARISON:  None  FINDINGS: An oblique fracture of the proximal -mid left femoral diaphysis is noted with displacement of 3 cm medially and 4.5 cm posteriorly. There is also 5 cm shortening.  There is no evidence of subluxation or dislocation.  No definite focal bony lesions are present.  IMPRESSION: Displaced oblique fracture of the proximal -mid left femur.   Electronically Signed   By: Laveda Abbe M.D.   On: 09/17/2013 00:37   Dg Femur Left  09/17/2013   CLINICAL DATA:  Fall with left leg pain.  EXAM: LEFT FEMUR - 2 VIEW  COMPARISON:  None.  FINDINGS: An oblique fracture of the proximal -mid left femoral diaphysis is noted with displacement of 3 cm medially and 4.5 cm posteriorly. There is also 5 cm shortening.  There is no evidence  of subluxation or dislocation.  IMPRESSION: Displaced fracture of the proximal -mid left femur.   Electronically Signed   By: Laveda Abbe M.D.   On: 09/17/2013 00:37   Dg Knee 1-2 Views Left  09/17/2013   CLINICAL DATA:  Fall  EXAM: LEFT KNEE - 1-2 VIEW  COMPARISON:  None.  FINDINGS: No acute fracture or dislocation. No joint effusion. Moderate joint space narrowing with juxta-articular osteophytosis present within the medial femorotibial joint space compartment. No soft tissue abnormality.  IMPRESSION: 1. No acute fracture or dislocation. 2. 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   Electronically Signed   By: Rise Mu M.D.   On: 09/17/2013 00:42    Assessment/Plan  78 yo female with new left femur fracture from fall with RBBB on ekg  Principal Problem:   Femur fracture, left-  Place on hip/femur fracture pathway.  Keep npo for surgery later today.  Active Problems:   RBBB- will obtain preop echo.  No h/o cardiac issues.  No prev ekg to c/w.     Fall at home    Yalissa Fink A 09/17/2013, 3:38 AM

## 2013-09-17 NOTE — Discharge Instructions (Signed)
Partial weight bearing left lower extremity until further directed  May shower just keep wounds dry

## 2013-09-17 NOTE — ED Notes (Signed)
Patient medicated for pain prior to being transported to radiology

## 2013-09-17 NOTE — Consult Note (Signed)
Reason for Consult: Left femur fracture Referring Physician:  Emergency room, MD  Lisa White is an 78 y.o. female.  HPI:  78yo female had unfortunate episode resulting an unusual stress that resulted in her femur fracturing.  No antecedent pains, no history of unexplained weight loss, no active malignancies, no fever chills or night sweats Immediate onset pain, deformity, inability to bear weight.  Transferred to [facility] ER for evaluation.  Admitted with left femur fracture  Past Medical History  Diagnosis Date  . Hypertension   . Arthritis     Past Surgical History  Procedure Laterality Date  . Tonsillectomy    . Appendectomy      History reviewed. No pertinent family history.  Social History:  reports that she has never smoked. She has never used smokeless tobacco. She reports that she does not drink alcohol or use illicit drugs.  Allergies: No Known Allergies  Medications: I have reviewed the patient's current medications. Scheduled:   Results for orders placed during the hospital encounter of 09/16/13 (from the past 24 hour(s))  CBC WITH DIFFERENTIAL     Status: Abnormal   Collection Time    09/16/13 11:25 PM      Result Value Ref Range   WBC 12.0 (*) 4.0 - 10.5 K/uL   RBC 4.69  3.87 - 5.11 MIL/uL   Hemoglobin 14.4  12.0 - 15.0 g/dL   HCT 16.1  09.6 - 04.5 %   MCV 90.0  78.0 - 100.0 fL   MCH 30.7  26.0 - 34.0 pg   MCHC 34.1  30.0 - 36.0 g/dL   RDW 40.9  81.1 - 91.4 %   Platelets 264  150 - 400 K/uL   Neutrophils Relative % 76  43 - 77 %   Neutro Abs 9.2 (*) 1.7 - 7.7 K/uL   Lymphocytes Relative 17  12 - 46 %   Lymphs Abs 2.0  0.7 - 4.0 K/uL   Monocytes Relative 5  3 - 12 %   Monocytes Absolute 0.6  0.1 - 1.0 K/uL   Eosinophils Relative 2  0 - 5 %   Eosinophils Absolute 0.2  0.0 - 0.7 K/uL   Basophils Relative 0  0 - 1 %   Basophils Absolute 0.0  0.0 - 0.1 K/uL  PROTIME-INR     Status: None   Collection Time    09/16/13 11:25 PM      Result Value  Ref Range   Prothrombin Time 11.7  11.6 - 15.2 seconds   INR 0.87  0.00 - 1.49  I-STAT CHEM 8, ED     Status: Abnormal   Collection Time    09/16/13 11:50 PM      Result Value Ref Range   Sodium 141  137 - 147 mEq/L   Potassium 4.0  3.7 - 5.3 mEq/L   Chloride 103  96 - 112 mEq/L   BUN 22  6 - 23 mg/dL   Creatinine, Ser 7.82  0.50 - 1.10 mg/dL   Glucose, Bld 956 (*) 70 - 99 mg/dL   Calcium, Ion 2.13 (*) 1.13 - 1.30 mmol/L   TCO2 27  0 - 100 mmol/L   Hemoglobin 15.3 (*) 12.0 - 15.0 g/dL   HCT 08.6  57.8 - 46.9 %  SURGICAL PCR SCREEN     Status: None   Collection Time    09/17/13  4:08 AM      Result Value Ref Range   MRSA, PCR NEGATIVE  NEGATIVE  Staphylococcus aureus NEGATIVE  NEGATIVE  TYPE AND SCREEN     Status: None   Collection Time    09/17/13  5:45 AM      Result Value Ref Range   ABO/RH(D) A NEG     Antibody Screen NEG     Sample Expiration 09/20/2013    ABO/RH     Status: None   Collection Time    09/17/13  5:45 AM      Result Value Ref Range   ABO/RH(D) A NEG      X-ray: CLINICAL DATA: Fall with left leg pain.  EXAM:  LEFT FEMUR - 2 VIEW  COMPARISON: None.  FINDINGS:  An oblique fracture of the proximal -mid left femoral diaphysis is  noted with displacement of 3 cm medially and 4.5 cm posteriorly.  There is also 5 cm shortening.  There is no evidence of subluxation or dislocation.  IMPRESSION:  Displaced fracture of the proximal -mid left femur.  Electronically Signed  By: Laveda AbbeJeff Hu M.D.   Review of Systems  Constitutional: Negative.   Eyes: Negative.   Respiratory: Negative.   Cardiovascular: Negative.   Gastrointestinal: Negative.   Genitourinary: Negative.   Skin: Negative.   Neurological: Negative.   Psychiatric/Behavioral: Negative.    Otherwise normal Reports that her right leg (contra lateral leg) occasionally gives way on her and that she was hoping to have it evaluated at least by primary doctor  Blood pressure 124/70, pulse 72,  temperature 98.5 F (36.9 C), temperature source Oral, resp. rate 16, height 5\' 5"  (1.651 m), weight 74.3 kg (163 lb 12.8 oz), SpO2 97.00%.  Physical Exam  Constitutional: She is oriented to person, place, and time. She appears well-developed and well-nourished.  HENT:  Head: Normocephalic.  Cardiovascular: Normal rate.   Respiratory: Effort normal and breath sounds normal. She has no wheezes.  GI: Soft. She exhibits no distension. There is no tenderness.  Neurological: She is alert and oriented to person, place, and time.   Left lower extremity in traction, thigh soft, NVI LLE Right lower extremity normal, no knee effusion  Assessment/Plan: Displaced left femur fracture NPO  To OR today for ORIF left femur Consent ordered Explained and reviewed fracture with her and her husband Will be PWB post operatively for 4-6 weeks DVT prophylaxis post operative  Kiernan Atkerson D 09/17/2013, 8:50 AM

## 2013-09-17 NOTE — Progress Notes (Signed)
[facility] PROGRESS NOTE  Lisa SpanBarbara White UJW:[id number]:[id number] DOB: 02-09-1936 DOA: 09/16/2013 PCP: No primary provider on file.  Assessment/Plan: 1. Left hip fracture. She apparently had a mechanical fall at home, losing her balance while tying her shoe. Patient not appearing to have acute cardiopulmonary issues, transthoracic echocardiogram showed an ejection fraction of 60-65%, taken to the OR on 09/17/2013 for ORIF. Physical therapy consult and social work consult.  2. Leukocytosis. Initial lab work showing a white count of 12,000, I suspect secondary to hip fracture. She does not appear to have acute infectious process.  Code Status: Full code Family Communication: I spoke with patient's husband present at bedside Disposition Plan: Plan to do to OR today   Consultants:  Orthopedic surgery  Procedures:  Orthopedic repair of left hip fracture   HPI/Subjective: Patient is a 78 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 She was unable to bear weight, in the scope brought [facility]. Imaging studies revealed a displaced oblique fracture of the proximal-mid left femur. She was admitted to the medicine service with orthopedic consultation. Patient having a transthoracic echocardiogram performed on 09/17/2013 given presence of right bundle branch block with no previous EKG to compare with. Transthoracic echocardiogram revealed ejection fraction of 60-65% without wall motion abnormalities. She did have grade 1 diastolic dysfunction. She does not appear to have active cardiopulmonary issues, cleared for surgery. She was taken to the OR on 09/17/2013 for ORIF.  Objective: Filed Vitals:   09/17/13 1148  BP: 158/79  Pulse: 89  Temp: 98.1 F (36.7 C)  Resp:     Intake/Output Summary (Last 24 hours) at 09/17/13 1413 Last data  filed at 09/17/13 1356  Gross per 24 hour  Intake  167.5 ml  Output   1100 ml  Net -932.5 ml   Filed Weights   09/17/13 0318  Weight: 74.3 kg (163 lb 12.8 oz)    Exam:   General:  Patient is in no acute shows, awake alert, cooperative, reports pain is well controlled.  Cardiovascular: Regular rate rhythm normal S1-S2  Respiratory: Clear to auscultation  Abdomen: Soft nontender nondistended  Musculoskeletal: Left lower extremity externally rotated, traction device in place.  Data Reviewed: Basic Metabolic Panel:  Recent Labs Lab 09/16/13 2350  NA 141  K 4.0  CL 103  GLUCOSE 153*  BUN 22  CREATININE 1.00   Liver Function Tests: No results found for this basename: AST, ALT, ALKPHOS, BILITOT, PROT, ALBUMIN,  in the last 168 hours No results found for this basename: LIPASE, AMYLASE,  in the last 168 hours No results found for this basename: AMMONIA,  in the last 168 hours CBC:  Recent Labs Lab 09/16/13 2325 09/16/13 2350  WBC 12.0*  --   NEUTROABS 9.2*  --   HGB 14.4 15.3*  HCT 42.2 45.0  MCV 90.0  --   PLT 264  --    Cardiac Enzymes: No results found for this basename: CKTOTAL, CKMB, CKMBINDEX, TROPONINI,  in the last 168 hours BNP (last 3 results) No results found for this basename: PROBNP,  in the last 8760 hours CBG: No results found for this basename: GLUCAP,  in the last 168 hours  Recent Results (from the past 240 hour(s))  SURGICAL PCR SCREEN     Status: None   Collection Time    09/17/13  4:08 AM  Result Value Ref Range Status   MRSA, PCR NEGATIVE  NEGATIVE Final   Staphylococcus aureus NEGATIVE  NEGATIVE Final   Comment:            The Xpert SA Assay (FDA     approved for NASAL specimens     in patients over 68 years of age),     is one component of     a comprehensive surveillance     program.  Test performance has     been validated by [employer] for patients greater     than or equal to 23 year old.     It is not intended      to diagnose infection nor to     guide or monitor treatment.     Studies: Dg Chest 1 View  09/17/2013   CLINICAL DATA:  78 year old female with left femur fracture. Preoperative respiratory examination.  EXAM: CHEST - 1 VIEW  COMPARISON:  None.  FINDINGS: The heart size and mediastinal contours are within normal limits. Both lungs are clear. The visualized skeletal structures are unremarkable.  IMPRESSION: No active disease.   Electronically Signed   By: Laveda Abbe M.D.   On: 09/17/2013 00:53   Dg Hip Complete Left  09/17/2013   CLINICAL DATA:  Left leg injury and pain.  EXAM: LEFT HIP - COMPLETE 2+ VIEW  COMPARISON:  None  FINDINGS: An oblique fracture of the proximal -mid left femoral diaphysis is noted with displacement of 3 cm medially and 4.5 cm posteriorly. There is also 5 cm shortening.  There is no evidence of subluxation or dislocation.  No definite focal bony lesions are present.  IMPRESSION: Displaced oblique fracture of the proximal -mid left femur.   Electronically Signed   By: Laveda Abbe M.D.   On: 09/17/2013 00:37   Dg Femur Left  09/17/2013   CLINICAL DATA:  Fall with left leg pain.  EXAM: LEFT FEMUR - 2 VIEW  COMPARISON:  None.  FINDINGS: An oblique fracture of the proximal -mid left femoral diaphysis is noted with displacement of 3 cm medially and 4.5 cm posteriorly. There is also 5 cm shortening.  There is no evidence of subluxation or dislocation.  IMPRESSION: Displaced fracture of the proximal -mid left femur.   Electronically Signed   By: Laveda Abbe M.D.   On: 09/17/2013 00:37   Dg Knee 1-2 Views Left  09/17/2013   CLINICAL DATA:  Fall  EXAM: LEFT KNEE - 1-2 VIEW  COMPARISON:  None.  FINDINGS: No acute fracture or dislocation. No joint effusion. Moderate joint space narrowing with juxta-articular osteophytosis present within the medial femorotibial joint space compartment. No soft tissue abnormality.  IMPRESSION: 1. No acute fracture or dislocation. 2. Moderate degenerative  osteoarthrosis within the medial femorotibial joint space compartment.   Electronically Signed   By: Rise Mu M.D.   On: 09/17/2013 00:42    Scheduled Meds:  Continuous Infusions: . sodium chloride 75 mL/hr at 09/17/13 1610    Principal Problem:   Femur fracture, left Active Problems:   RBBB   Fall at home    Time spent:35 minutes    Lisa White  [facility] Pager [phone]. If 7PM-7AM, please contact night-coverage at [email or web address], password [facility] 09/17/2013, 2:13 PM  LOS: 1 day

## 2013-09-18 ENCOUNTER — Encounter (HOSPITAL_COMMUNITY): Payer: Self-pay | Admitting: Orthopedic Surgery

## 2013-09-18 DIAGNOSIS — Y92009 Unspecified place in unspecified non-institutional (private) residence as the place of occurrence of the external cause: Secondary | ICD-10-CM

## 2013-09-18 DIAGNOSIS — I4510 Unspecified right bundle-branch block: Secondary | ICD-10-CM

## 2013-09-18 DIAGNOSIS — D72829 Elevated white blood cell count, unspecified: Secondary | ICD-10-CM

## 2013-09-18 DIAGNOSIS — W19XXXA Unspecified fall, initial encounter: Secondary | ICD-10-CM

## 2013-09-18 LAB — CBC
HCT: 28 % — ABNORMAL LOW (ref 36.0–46.0)
Hemoglobin: 9.5 g/dL — ABNORMAL LOW (ref 12.0–15.0)
MCH: 30.5 pg (ref 26.0–34.0)
MCHC: 33.9 g/dL (ref 30.0–36.0)
MCV: 90 fL (ref 78.0–100.0)
Platelets: 213 10*3/uL (ref 150–400)
RBC: 3.11 MIL/uL — AB (ref 3.87–5.11)
RDW: 12.8 % (ref 11.5–15.5)
WBC: 9 10*3/uL (ref 4.0–10.5)

## 2013-09-18 LAB — BASIC METABOLIC PANEL
BUN: 16 mg/dL (ref 6–23)
CO2: 24 mEq/L (ref 19–32)
Calcium: 7.7 mg/dL — ABNORMAL LOW (ref 8.4–10.5)
Chloride: 105 mEq/L (ref 96–112)
Creatinine, Ser: 0.85 mg/dL (ref 0.50–1.10)
GFR calc Af Amer: 76 mL/min — ABNORMAL LOW (ref >90)
GFR calc non Af Amer: 65 mL/min — ABNORMAL LOW (ref >90)
Glucose, Bld: 161 mg/dL — ABNORMAL HIGH (ref 70–99)
POTASSIUM: 4.8 meq/L (ref 3.7–5.3)
SODIUM: 138 meq/L (ref 137–147)

## 2013-09-18 MED ORDER — DSS 100 MG PO CAPS: 100.0000 mg | ORAL_CAPSULE | Freq: Two times a day (BID) | ORAL | Status: DC

## 2013-09-18 MED ORDER — ALUM & MAG HYDROXIDE-SIMETH 200-200-20 MG/5ML PO SUSP: 30.0000 mL | ORAL | Status: AC | PRN

## 2013-09-18 NOTE — Op Note (Signed)
NAMBradly Chris:  Bera, Labria             ACCOUNT NO.:  [id number]  MEDICAL RECORD NO.:  [id number]  LOCATION:  1616                         [facility]  PHYSICIAN:  Madlyn FrankelMatthew D. Charlann Boxerlin, M.D.  DATE OF BIRTH:  03/09/1936  DATE OF PROCEDURE:  09/17/2013 DATE OF DISCHARGE:                              OPERATIVE REPORT   PREOPERATIVE DIAGNOSIS:  Mid shaft left femur fracture.  POSTOPERATIVE DIAGNOSIS:  Mid shaft left femur fracture.  PROCEDURE:  Open reduction and internal fixation of the left femur fracture utilizing an intramedullary nail technique.  A Biomet VersaNail trochanter nail was utilized measuring 9 mm x 360 mm with proximal interlocks from the greater to the lesser trochanter, and 1 distal interlock.  SURGEON:  Madlyn FrankelMatthew D. Charlann Boxerlin, M.D.  ASSISTANT:  Surgical team.  ANESTHESIA:  General.  SPECIMENS:  None.  COMPLICATION:  None.  BLOOD LOSS:  Less than 100 mL.  INDICATION FOR THE PROCEDURE:  Ms. Lisa White is a 78 year old female, who had an unfortunate fall at her house with immediate onset of pain, deformity, inability to bear weight.  [facility].  Radiographs revealed a displaced mid shaft femur fracture.  She was admitted to the medical service per fracture pathway protocol.  Orthopedics consulted for management.  She was seen and evaluated with her husband in the hospital room revealing the fracture pattern with her the necessity for fracture repair and the current techniques utilized.  Risks of nonunion, malunion, infection, as well as the benefits of fracture management and stabilization were all reviewed. Consent was obtained for benefit of fracture care.  PROCEDURE IN DETAIL:  The patient was brought to operative theater. Once adequate anesthesia, preoperative antibiotics, Ancef 2 g administered.  She was positioned supine on the fracture table.  Her right unaffected extremity was flexed and abducted away with bony prominences  padded particularly the peroneal nerve.  The padded perineal post was placed.  The left foot was placed in a traction boot.  Under fluoroscopic imaging, we applied traction to identify fracture pattern and recognized maneuvers that would help to reduce the fracture.  At this point, the left hip was prepped and draped in sterile fashion from the iliac wing to the below the knee.  A time-out was performed identifying the patient, planned procedure, and extremity.  Fluoroscopy was brought back to the field.  The tip of the trochanter was identified and lateral incision was made proximal trochanter.  Soft tissue dissection was carried down to the gluteal fascia.  The guidewire was then inserted into the tip of the trochanter and passed to the proximal femur.  The 13-mm starting drill was then passed into the proximal femur.  At this point, I used the reduction tool and passed it into the proximal shaft of the femur to the fracture site.  Then, under fluoroscopic imaging, we reduced the fracture with recognized maneuvers and passed the reduction tool across the fracture site.  I was then able to easily pass a ball-tipped guidewire across the fracture site to the distal aspect of the femur in the center portion of the physeal scar distally.  At this point, most of the traction was let  off.  Just gentle traction maintained reduction.  I then reamed beginning with a 9 mm reamer and reamed up to a 11 mm reamer and selected a 9 mm nail based on the shattered that was identified in the isthmus of the femur.  We had measured the depth and selected a 36-cm nail.  The nail was then attached to the insertion jig and passed by hand across the fracture site under fluoroscopic confirmation maintaining the near anatomic reduction.  The nail was seated appropriately proximally confirmed radiographically, I placed a single proximal interlock from the greater to lesser trochanter.  I then evaluated the  fracture site and impacted proximally to further compress the fracture site to a near anatomic alignment and making certain that the distal orientation and rotation of her lower leg were anatomic.  At this point, a distal interlock screw was placed under perfect circle technique distally to allow for a couple of millimeters dynamization as necessary.  Once this was done, all final radiographs were obtained in AP and lateral planes of the proximal, distal, and at the fracture site.  The jig was removed.  All wounds were irrigated with normal saline solution. The proximal wound was closed in layers with #1 Vicryl in the gluteal fascia, then 2-0 Vicryl in the subcu layer, and a running 4-0 Monocryl. The distal wound was closed with 2-0 Vicryl.  The wounds were then cleaned, dried, and dressed sterilely using Dermabond and Mepilex dressing.  She was then brought to the recovery room, extubated in stable condition tolerating the procedure well.  Findings will be reviewed with the family.  I will make her partial weightbearing initial postop.  We will transition her fairly quickly over the next month to weightbearing as tolerated.     Madlyn Frankel Charlann Boxer, M.D.     MDO/MEDQ  D:  09/17/2013  T:  09/18/2013  Job:  [id number]

## 2013-09-18 NOTE — Progress Notes (Signed)
Clinical Social Work Department CLINICAL SOCIAL WORK PLACEMENT NOTE 09/18/2013  Patient:  Lisa White,Lisa White  Account Number:  [id number] Admit date:  09/16/2013  Clinical Social Worker:  Cori RazorJAMIE Kailah Pennel, LCSW  Date/time:  09/18/2013 08:29 AM  Clinical Social Work is seeking post-discharge placement for this patient at the following level of care:   SKILLED NURSING   (*CSW will update this form in Epic as items are completed)     Patient/family provided with [facility] Department of Clinical Social Work's list of facilities offering this level of care within the geographic area requested by the patient (or if unable, by the patient's family).  09/18/2013  Patient/family informed of their freedom to choose among providers that offer the needed level of care, that participate in Medicare, Medicaid or managed care program needed by the patient, have an available bed and are willing to accept the patient.    Patient/family informed of [facility]' ownership interest in [facility], as well as of the fact that they are under no obligation to receive care at this facility.  PASARR submitted to EDS on 09/17/2013 PASARR number received from EDS on 09/17/2013  FL2 transmitted to all facilities in geographic area requested by pt/family on  09/18/2013 FL2 transmitted to all facilities within larger geographic area on   Patient informed that his/her managed care company has contracts with or will negotiate with  certain facilities, including the following:     Patient/family informed of bed offers received:   Patient chooses bed at  Physician recommends and patient chooses bed at    Patient to be transferred to  on   Patient to be transferred to facility by   The following physician request were entered in Epic:   Additional Comments:

## 2013-09-18 NOTE — Progress Notes (Signed)
[facility] PROGRESS NOTE  Lisa White ZOX:[id number] DOB: March 25, 1936 DOA: 09/16/2013 PCP: No primary provider on file.  Assessment/Plan: 1. Left hip fracture. She apparently had a mechanical fall at home, losing her balance while tying her shoe. Patient not appearing to have acute cardiopulmonary issues, transthoracic echocardiogram showed an ejection fraction of 60-65%, taken to the OR on 09/17/2013 undergoing ORIF using intramedullary technique. She tolerated procedure well, no complications. Physical therapy consult and social work consult.  2. Leukocytosis. Initial lab work showing a white count of 12,000, I suspect secondary to hip fracture. She does not appear to have acute infectious process.  Code Status: Full code Family Communication: I spoke with patient's husband present at bedside Disposition Plan: Plan to transition to SNF on Sunday   Consultants:  Orthopedic surgery  Procedures:  Orthopedic repair of left hip fracture   HPI/Subjective: Patient is a 78 year old female with no significant past medical history who was in her usual state health, lost her balance while tying her shoe, fell backwards, injured her left hip. She was unable to bear weight, in the scope brought to the emergency department at [facility]. Imaging studies revealed a displaced oblique fracture of the proximal-mid left femur. She was admitted to the medicine service with orthopedic consultation. Patient having a transthoracic echocardiogram performed on 09/17/2013 given presence of right bundle branch block with no previous EKG to compare with. Transthoracic echocardiogram revealed ejection fraction of 60-65% without wall motion abnormalities. She did have grade 1 diastolic dysfunction. She does not appear to have active cardiopulmonary issues, cleared for surgery. She was taken to the OR on 09/17/2013 for ORIF.  Objective: Filed Vitals:   09/18/13 1034  BP: 148/74  Pulse: 102   Temp: 96.5 F (35.8 C)  Resp: 16    Intake/Output Summary (Last 24 hours) at 09/18/13 1143 Last data filed at 09/18/13 1031  Gross per 24 hour  Intake 2218.75 ml  Output   1500 ml  Net 718.75 ml   Filed Weights   09/17/13 0318  Weight: 74.3 kg (163 lb 12.8 oz)    Exam:   General:  Patient is in no acute shows, awake alert, cooperative, reports pain is well controlled.  Cardiovascular: Regular rate rhythm normal S1-S2  Respiratory: Clear to auscultation  Abdomen: Soft nontender nondistended  Musculoskeletal: Left lower extremity s/p ORIF no evidence of infection or excessive bleed.   Data Reviewed: Basic Metabolic Panel:  Recent Labs Lab 09/16/13 2350 09/17/13 0545 09/18/13 0450  NA 141  --  138  K 4.0  --  4.8  CL 103  --  105  CO2  --   --  24  GLUCOSE 153*  --  161*  BUN 22  --  16  CREATININE 1.00 0.81 0.85  CALCIUM  --   --  7.7*   Liver Function Tests: No results found for this basename: AST, ALT, ALKPHOS, BILITOT, PROT, ALBUMIN,  in the last 168 hours No results found for this basename: LIPASE, AMYLASE,  in the last 168 hours No results found for this basename: AMMONIA,  in the last 168 hours CBC:  Recent Labs Lab 09/16/13 2325 09/16/13 2350 09/17/13 0545 09/18/13 0450  WBC 12.0*  --  8.6 9.0  NEUTROABS 9.2*  --   --   --   HGB 14.4 15.3* 12.4 9.5*  HCT 42.2 45.0 38.2 28.0*  MCV 90.0  --  92.3 90.0  PLT 264  --  233 213   Cardiac Enzymes:  No results found for this basename: CKTOTAL, CKMB, CKMBINDEX, TROPONINI,  in the last 168 hours BNP (last 3 results) No results found for this basename: PROBNP,  in the last 8760 hours CBG: No results found for this basename: GLUCAP,  in the last 168 hours  Recent Results (from the past 240 hour(s))  SURGICAL PCR SCREEN     Status: None   Collection Time    09/17/13  4:08 AM      Result Value Ref Range Status   MRSA, PCR NEGATIVE  NEGATIVE Final   Staphylococcus aureus NEGATIVE  NEGATIVE Final    Comment:            The Xpert SA Assay (FDA     approved for NASAL specimens     in patients over 43 years of age),     is one component of     a comprehensive surveillance     program.  Test performance has     been validated by [employer] for patients greater     than or equal to 58 year old.     It is not intended     to diagnose infection nor to     guide or monitor treatment.     Studies: Dg Chest 1 View  09/17/2013   CLINICAL DATA:  78 year old female with left femur fracture. Preoperative respiratory examination.  EXAM: CHEST - 1 VIEW  COMPARISON:  None.  FINDINGS: The heart size and mediastinal contours are within normal limits. Both lungs are clear. The visualized skeletal structures are unremarkable.  IMPRESSION: No active disease.   Electronically Signed   By: Laveda Abbe M.D.   On: 09/17/2013 00:53   Dg Hip Complete Left  09/17/2013   CLINICAL DATA:  Left leg injury and pain.  EXAM: LEFT HIP - COMPLETE 2+ VIEW  COMPARISON:  None  FINDINGS: An oblique fracture of the proximal -mid left femoral diaphysis is noted with displacement of 3 cm medially and 4.5 cm posteriorly. There is also 5 cm shortening.  There is no evidence of subluxation or dislocation.  No definite focal bony lesions are present.  IMPRESSION: Displaced oblique fracture of the proximal -mid left femur.   Electronically Signed   By: Laveda Abbe M.D.   On: 09/17/2013 00:37   Dg Femur Left  09/17/2013   CLINICAL DATA:  Fracture left femur with placement of intra medullary rod  EXAM: LEFT FEMUR - 2 VIEW  COMPARISON:  DG KNEE1- 2 VIEWS*L* dated 09/17/2013; DG FEMUR*L* dated 09/17/2013  FINDINGS: There is an intra medullary rod through the left femur. The fracture of the diaphysis is in near anatomic position and alignment. Intra medullary rod is positioned appropriately within the femoral shaft.  Fluoro time:  1 min 24 seconds  IMPRESSION: Status post ORIF left femur fracture   Electronically Signed   By: Esperanza Heir  M.D.   On: 09/17/2013 15:25   Dg Femur Left  09/17/2013   CLINICAL DATA:  Fall with left leg pain.  EXAM: LEFT FEMUR - 2 VIEW  COMPARISON:  None.  FINDINGS: An oblique fracture of the proximal -mid left femoral diaphysis is noted with displacement of 3 cm medially and 4.5 cm posteriorly. There is also 5 cm shortening.  There is no evidence of subluxation or dislocation.  IMPRESSION: Displaced fracture of the proximal -mid left femur.   Electronically Signed   By: Laveda Abbe M.D.   On: 09/17/2013 00:37  Dg Knee 1-2 Views Left  09/17/2013   CLINICAL DATA:  Fall  EXAM: LEFT KNEE - 1-2 VIEW  COMPARISON:  None.  FINDINGS: No acute fracture or dislocation. No joint effusion. Moderate joint space narrowing with juxta-articular osteophytosis present within the medial femorotibial joint space compartment. No soft tissue abnormality.  IMPRESSION: 1. No acute fracture or dislocation. 2. 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   Electronically Signed   By: Rise MuBenjamin  McClintock M.D.   On: 09/17/2013 00:42   Dg C-arm 1-60 Min-no Report  09/17/2013   CLINICAL DATA: femur surgery   C-ARM 1-60 MINUTES  Fluoroscopy was utilized by the requesting physician.  No radiographic  interpretation.     Scheduled Meds: . docusate sodium  100 mg Oral BID  . enoxaparin (LOVENOX) injection  40 mg Subcutaneous Q24H   Continuous Infusions: . sodium chloride 0.9 % 1,000 mL with potassium chloride 10 mEq infusion 75 mL/hr at 09/18/13 1100    Principal Problem:   Femur fracture, left Active Problems:   RBBB   Fall at home    Time spent:35 minutes    [name], Cystal Shannahan  [facility] Pager [phone]. If 7PM-7AM, please contact night-coverage at [email or web address], password [email or web address] 09/18/2013, 11:43 AM  LOS: 2 days

## 2013-09-18 NOTE — Progress Notes (Signed)
Clinical Social Work Department BRIEF PSYCHOSOCIAL ASSESSMENT 09/18/2013  Patient:  Lisa White, Lisa White     Account Number:  [id number]     Admit date:  09/16/2013  Clinical Social Worker:  Lacie Scotts  Date/Time:  09/18/2013 08:09 AM  Referred by:  Physician  Date Referred:  09/18/2013 Referred for  SNF Placement   Other Referral:   Interview type:  Patient Other interview type:    PSYCHOSOCIAL DATA Living Status:  HUSBAND Admitted from facility:   Level of care:   Primary support name:  Lisa White Primary support relationship to patient:  SPOUSE Degree of support available:   supportive    CURRENT CONCERNS  Other Concerns:    SOCIAL WORK ASSESSMENT / PLAN Pt is a 78 yr old female living at home prior to hospitalization. CSW met with pt / spouse 2/26 to assist with d/c planning. Pt fell and fx her hip. ORIF was done on 2/26. PT will evaluate today. If ST Rehab is recommended pt is requesting [facility]. SNF has been contacted and a decision is pending. CSW will continue to follow to assist with d/c planning.   Assessment/plan status:  Psychosocial Support/Ongoing Assessment of Needs Other assessment/ plan:   Information/referral to community resources:   Insurance coverage for SNF and ambulance transport reviewed.    PATIENT'S/FAMILY'S RESPONSE TO PLAN OF CARE: Pt's affect is bright. She is starting to feel better. " If I need rehab I want to go to [facility]".    Werner Lean LCSW [phone]

## 2013-09-18 NOTE — Progress Notes (Signed)
[facility] if pt is stable for d/c. [facility] Weekend CSW will assist with d/c planning to SNF.  Cori RazorJamie Lucindia Lemley LCSW [phone]

## 2013-09-18 NOTE — Progress Notes (Signed)
   Subjective: 1 Day Post-Op Procedure(s) (LRB): OPEN REDUCTION INTERNAL FIXATION (ORIF) DISTAL FEMUR FRACTURE (Left)   Patient reports pain as mild, while sitting in the bed.  She hasn't been up on the leg yet. She is a little nervous about getting up on the leg, but ready to do so.  No events throughout the night.  Objective:   VITALS:   Filed Vitals:   09/18/13 0555  BP: 100/64  Pulse: 73  Temp: 98 F (36.7 C)  Resp: 20    Neurovascular intact Dorsiflexion/Plantar flexion intact Incision: dressing C/D/I No cellulitis present Compartment soft  LABS  Recent Labs  09/16/13 2325 09/16/13 2350 09/17/13 0545 09/18/13 0450  HGB 14.4 15.3* 12.4 9.5*  HCT 42.2 45.0 38.2 28.0*  WBC 12.0*  --  8.6 9.0  PLT 264  --  233 213     Recent Labs  09/16/13 2350 09/17/13 0545 09/18/13 0450  NA 141  --  138  K 4.0  --  4.8  BUN 22  --  16  CREATININE 1.00 0.81 0.85  GLUCOSE 153*  --  161*     Assessment/Plan: 1 Day Post-Op Procedure(s) (LRB): OPEN REDUCTION INTERNAL FIXATION (ORIF) DISTAL FEMUR FRACTURE (Left) Up with therapy Discharge to SNF eventually, when ready PWB 50% LLE Norco for pain, Rx written Lovenox for anticoagulation, Rx written [facility]. Follow up with OLIN,Kayin Kettering D in 2 weeks.  Contact information:  [facility] [address], [address] [place] [id number] [phone]        Anastasio AuerbachMatthew S. Estefanny Moler   PAC  09/18/2013, 8:11 AM

## 2013-09-18 NOTE — Evaluation (Signed)
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  s/p L femur IM nail  Clinical Impression  Patient is s/p L femur IM nail surgery resulting in functional limitations due to the deficits listed below (see PT Problem List).  Patient will benefit from skilled PT to increase their independence and safety with mobility to allow discharge to the venue listed below.  Pt mobilizing well POD #1 and plans to d/c to SNF.     PT Assessment  Patient needs continued PT services    Follow Up Recommendations  SNF    Does the patient have the potential to tolerate intense rehabilitation      Barriers to Discharge        Equipment Recommendations  None recommended by PT    Recommendations for Other Services     Frequency Min 4X/week    Precautions / Restrictions Precautions Precautions: Fall Restrictions Weight Bearing Restrictions: Yes LLE Weight Bearing: Partial weight bearing   Pertinent Vitals/Pain Min L leg pain with movement, activity to tolerance      Mobility  Bed Mobility Overal bed mobility: Needs Assistance Bed Mobility: Supine to Sit Supine to sit: Supervision General bed mobility comments: verbal cues for technique Transfers Overall transfer level: Needs assistance Equipment used: Rolling walker (2 wheeled) Transfers: Sit to/from Stand Sit to Stand: Min guard General transfer comment: verbal cues for safe technique Ambulation/Gait Ambulation/Gait assistance: Min guard Ambulation Distance (Feet): 100 Feet Assistive device: Rolling walker (2 wheeled) Gait Pattern/deviations: Step-to pattern;Decreased step length -  left General Gait Details: verbal cues for PWB, sequence, RW distance, step length    Exercises Total Joint Exercises Ankle Circles/Pumps: AROM;Both;15 reps Quad Sets: AROM;Both;15 reps Heel Slides: AROM;Left;15 reps Hip ABduction/ADduction: AROM;Left;15 reps Straight Leg Raises: AROM;Left;15 reps   PT Diagnosis: Difficulty walking  PT Problem List: Decreased strength;Decreased mobility;Pain;Decreased knowledge of use of DME;Decreased knowledge of precautions PT Treatment Interventions: DME instruction;Gait training;Functional mobility training;Therapeutic activities;Therapeutic exercise;Patient/family education     PT Goals(Current goals can be found in the care plan section) Acute Rehab PT Goals PT Goal Formulation: With patient Time For Goal Achievement: 09/25/13 Potential to Achieve Goals: Good  Visit Information  Last PT Received On: 09/18/13 Assistance Needed: +1 History of Present Illness: 78 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  s/p L femur IM nail       Prior Functioning  Home Living Family/patient expects to be discharged to:: Skilled nursing facility Living Arrangements: Spouse/significant other Type of Home: House Home Access: Stairs to enter Secretary/administrator of Steps: 3 Home Layout: One level Home Equipment: None Prior Function Level of Independence: Independent Communication Communication: No difficulties    Cognition  Cognition Arousal/Alertness: Awake/alert Behavior During Therapy: WFL for tasks assessed/performed Overall Cognitive Status: Within Functional Limits for tasks assessed    Extremity/Trunk Assessment Lower Extremity Assessment Lower Extremity Assessment: LLE deficits/detail LLE Deficits / Details: able to move against gravity however not full range at hip , good quad contraction   Balance    End of Session  PT - End of Session Activity Tolerance: Patient  tolerated treatment well Patient left: in chair;with call bell/phone within reach;with nursing/sitter in room  GP     [facility] Children[facility]YRE,KATHrine E 09/18/2013, 12:58 PM Zenovia JarredKati Aspin Palomarez, PT, DPT 09/18/2013 Pager: [phone]

## 2013-09-18 NOTE — Progress Notes (Signed)
OT Cancellation Note  Patient Details Name: Lisa White MRN: [id number] DOB: 01-26-1936   Cancelled Treatment:    Reason Eval/Treat Not Completed: Other (comment).  Pt is planning snf for rehab.  OT eval is not needed for admission, so will defer OT to that venue.    Antonia Culbertson 09/18/2013, 2:27 PM Marica OtterMaryellen Shaliyah Taite, OTR/L [phone] 09/18/2013

## 2013-09-18 NOTE — Discharge Summary (Addendum)
Physician Discharge Summary  Lisa White WUJ:[id number] DOB: 1936/06/11 DOA: 09/16/2013  PCP: No primary provider on file.  Admit date: 09/16/2013 Discharge date: 09/18/2013  Time spent: 35 minutes  Recommendations for Outpatient Follow-up:  1. Repeat CBC in 1 week  Discharge Diagnoses:  Principal Problem:   Femur fracture, left Active Problems:   RBBB   Fall at home   Discharge Condition: Stable  Diet recommendation: Heart Healthy  Filed Weights   09/17/13 0318  Weight: 74.3 kg (163 lb 12.8 oz)    History of present illness:  78 yo female who was trying to tie her shoe, twisted and fell and hurt her left leg. She has broken her left femur. Pt denies loc. She is overall very healthy. No cardiac issues. No h/o cva or chf. No copd. Very active. Pain well controlled at this time.  Hospital Course:  Patient is a 78 year old female with no significant past medical history who was in her usual state health, lost her balance while tying her shoe, fell backwards, injured her left hip. She was unable to bear weight, and brought to the emergency department at [facility]. Imaging studies revealed a displaced oblique fracture of the proximal-mid left femur. She was admitted to the medicine service with orthopedic consultation. Patient having a transthoracic echocardiogram performed on 09/17/2013 given presence of right bundle branch block with no previous EKG to compare with. Transthoracic echocardiogram revealed ejection fraction of 60-65% without wall motion abnormalities. She did have grade 1 diastolic dysfunction. She does not appear to have active cardiopulmonary issues, cleared for surgery. She was taken to the OR on 09/17/2013 for ORIF. She had open reduction and internal fixation of the left femur fracture utilizing an intramedullary nail technique.Tolerated procedure well, no immediate complications.   Addendum I evaluated patient on 09/20/13. She appears comfortable,  tolerating PO intake, pain well controlled, participating in physical therapy. Vitals have remained stable. Surgical incision site appears clean, no evidence of infection or bleed. Given clinical stability with discharge her to SNF today.   Procedures: Open reduction and internal fixation of the left femur fracture utilizing an intramedullary nail technique.   Consultations:  Orthopedic Surgery  Discharge Exam: Filed Vitals:   09/18/13 1034  BP: 148/74  Pulse: 102  Temp: 96.5 F (35.8 C)  Resp: 16   General: Patient is in no acute shows, awake alert, cooperative, reports pain is well controlled.  Cardiovascular: Regular rate rhythm normal S1-S2  Respiratory: Clear to auscultation  Abdomen: Soft nontender nondistended  Musculoskeletal: Left lower extremity s/p ORIF no evidence of infection or excessive bleed.     Discharge Instructions  Discharge Orders   Future Orders Complete By Expires   Call MD for:  difficulty breathing, headache or visual disturbances  As directed    Call MD for:  persistant dizziness or light-headedness  As directed    Call MD for:  persistant nausea and vomiting  As directed    Call MD for:  redness, tenderness, or signs of infection (pain, swelling, redness, odor or green/yellow discharge around incision site)  As directed    Call MD for:  severe uncontrolled pain  As directed    Call MD for:  temperature >100.4  As directed    Diet - low sodium heart healthy  As directed    Increase activity slowly  As directed    Partial weight bearing  As directed    Scheduling Instructions:     50%   Questions:     %  Body Weight:     Laterality:     Extremity:         Medication List         alendronate 70 MG tablet  Commonly known as:  FOSAMAX  Take 70 mg by mouth once a week.     alum & mag hydroxide-simeth 200-200-20 MG/5ML suspension  Commonly known as:  MAALOX/MYLANTA  Take 30 mLs by mouth every 4 (four) hours as needed for indigestion.      amLODipine 10 MG tablet  Commonly known as:  NORVASC  Take 10 mg by mouth at bedtime.     aspirin EC 81 MG tablet  Take 81 mg by mouth at bedtime.     atorvastatin 20 MG tablet  Commonly known as:  LIPITOR  Take 20 mg by mouth at bedtime.     DSS 100 MG Caps  Take 100 mg by mouth 2 (two) times daily.     enoxaparin 40 MG/0.4ML injection  Commonly known as:  LOVENOX  Inject 0.4 mLs (40 mg total) into the skin daily.     HYDROcodone-acetaminophen 5-325 MG per tablet  Commonly known as:  NORCO  Take 1-2 tablets by mouth every 6 (six) hours as needed.       No Known Allergies     Follow-up Information   Follow up with Shelda Pal, MD. Schedule an appointment as soon as possible for a visit in 2 weeks.   Specialty:  Orthopedic Surgery   Contact information:   [address] [place] [address] [phone]        The results of significant diagnostics from this hospitalization (including imaging, microbiology, ancillary and laboratory) are listed below for reference.    Significant Diagnostic Studies: Dg Chest 1 View  09/17/2013   CLINICAL DATA:  78 year old female with left femur fracture. Preoperative respiratory examination.  EXAM: CHEST - 1 VIEW  COMPARISON:  None.  FINDINGS: The heart size and mediastinal contours are within normal limits. Both lungs are clear. The visualized skeletal structures are unremarkable.  IMPRESSION: No active disease.   Electronically Signed   By: Laveda Abbe M.D.   On: 09/17/2013 00:53   Dg Hip Complete Left  09/17/2013   CLINICAL DATA:  Left leg injury and pain.  EXAM: LEFT HIP - COMPLETE 2+ VIEW  COMPARISON:  None  FINDINGS: An oblique fracture of the proximal -mid left femoral diaphysis is noted with displacement of 3 cm medially and 4.5 cm posteriorly. There is also 5 cm shortening.  There is no evidence of subluxation or dislocation.  No definite focal bony lesions are present.  IMPRESSION: Displaced oblique fracture of  the proximal -mid left femur.   Electronically Signed   By: Laveda Abbe M.D.   On: 09/17/2013 00:37   Dg Femur Left  09/17/2013   CLINICAL DATA:  Fracture left femur with placement of intra medullary rod  EXAM: LEFT FEMUR - 2 VIEW  COMPARISON:  DG KNEE1- 2 VIEWS*L* dated 09/17/2013; DG FEMUR*L* dated 09/17/2013  FINDINGS: There is an intra medullary rod through the left femur. The fracture of the diaphysis is in near anatomic position and alignment. Intra medullary rod is positioned appropriately within the femoral shaft.  Fluoro time:  1 min 24 seconds  IMPRESSION: Status post ORIF left femur fracture   Electronically Signed   By: Esperanza Heir M.D.   On: 09/17/2013 15:25   Dg Femur Left  09/17/2013   CLINICAL DATA:  Fall with  left leg pain.  EXAM: LEFT FEMUR - 2 VIEW  COMPARISON:  None.  FINDINGS: An oblique fracture of the proximal -mid left femoral diaphysis is noted with displacement of 3 cm medially and 4.5 cm posteriorly. There is also 5 cm shortening.  There is no evidence of subluxation or dislocation.  IMPRESSION: Displaced fracture of the proximal -mid left femur.   Electronically Signed   By: Laveda Abbe M.D.   On: 09/17/2013 00:37   Dg Knee 1-2 Views Left  09/17/2013   CLINICAL DATA:  Fall  EXAM: LEFT KNEE - 1-2 VIEW  COMPARISON:  None.  FINDINGS: No acute fracture or dislocation. No joint effusion. Moderate joint space narrowing with juxta-articular osteophytosis present within the medial femorotibial joint space compartment. No soft tissue abnormality.  IMPRESSION: 1. No acute fracture or dislocation. 2. 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   Electronically Signed   By: Rise Mu M.D.   On: 09/17/2013 00:42   Dg C-arm 1-60 Min-no Report  09/17/2013   CLINICAL DATA: femur surgery   C-ARM 1-60 MINUTES  Fluoroscopy was utilized by the requesting physician.  No radiographic  interpretation.     Microbiology: Recent Results (from  the past 240 hour(s))  SURGICAL PCR SCREEN     Status: None   Collection Time    09/17/13  4:08 AM      Result Value Ref Range Status   MRSA, PCR NEGATIVE  NEGATIVE Final   Staphylococcus aureus NEGATIVE  NEGATIVE Final   Comment:            The Xpert SA Assay (FDA     approved for NASAL specimens     in patients over 31 years of age),     is one component of     a comprehensive surveillance     program.  Test performance has     been validated by [employer] for patients greater     than or equal to 31 year old.     It is not intended     to diagnose infection nor to     guide or monitor treatment.     Labs: Basic Metabolic Panel:  Recent Labs Lab 09/16/13 2350 09/17/13 0545 09/18/13 0450  NA 141  --  138  K 4.0  --  4.8  CL 103  --  105  CO2  --   --  24  GLUCOSE 153*  --  161*  BUN 22  --  16  CREATININE 1.00 0.81 0.85  CALCIUM  --   --  7.7*   Liver Function Tests: No results found for this basename: AST, ALT, ALKPHOS, BILITOT, PROT, ALBUMIN,  in the last 168 hours No results found for this basename: LIPASE, AMYLASE,  in the last 168 hours No results found for this basename: AMMONIA,  in the last 168 hours CBC:  Recent Labs Lab 09/16/13 2325 09/16/13 2350 09/17/13 0545 09/18/13 0450  WBC 12.0*  --  8.6 9.0  NEUTROABS 9.2*  --   --   --   HGB 14.4 15.3* 12.4 9.5*  HCT 42.2 45.0 38.2 28.0*  MCV 90.0  --  92.3 90.0  PLT 264  --  233 213   Cardiac Enzymes: No results found for this basename: CKTOTAL, CKMB, CKMBINDEX, TROPONINI,  in the last 168 hours BNP: BNP (last 3 results) No results found for this basename: PROBNP,  in the last 8760  hours CBG: No results found for this basename: GLUCAP,  in the last 168 hours     Signed:  [name], Lisa White  [facility] 09/18/2013, 11:53 AM

## 2013-09-19 LAB — CBC
HCT: 25.8 % — ABNORMAL LOW (ref 36.0–46.0)
HEMOGLOBIN: 8.7 g/dL — AB (ref 12.0–15.0)
MCH: 30.5 pg (ref 26.0–34.0)
MCHC: 33.7 g/dL (ref 30.0–36.0)
MCV: 90.5 fL (ref 78.0–100.0)
Platelets: 195 10*3/uL (ref 150–400)
RBC: 2.85 MIL/uL — ABNORMAL LOW (ref 3.87–5.11)
RDW: 13 % (ref 11.5–15.5)
WBC: 9.7 10*3/uL (ref 4.0–10.5)

## 2013-09-19 LAB — BASIC METABOLIC PANEL
BUN: 23 mg/dL (ref 6–23)
CALCIUM: 8 mg/dL — AB (ref 8.4–10.5)
CO2: 24 meq/L (ref 19–32)
Chloride: 108 mEq/L (ref 96–112)
Creatinine, Ser: 0.82 mg/dL (ref 0.50–1.10)
GFR calc Af Amer: 79 mL/min — ABNORMAL LOW (ref >90)
GFR calc non Af Amer: 68 mL/min — ABNORMAL LOW (ref >90)
GLUCOSE: 140 mg/dL — AB (ref 70–99)
Potassium: 4.6 mEq/L (ref 3.7–5.3)
SODIUM: 142 meq/L (ref 137–147)

## 2013-09-19 NOTE — Progress Notes (Signed)
[facility] PROGRESS NOTE  Lisa White ZOX:[id number] DOB: 04/06/1936 DOA: 09/16/2013 PCP: No primary provider on file.  Assessment/Plan: 1. Left hip fracture. She apparently had a mechanical fall at home, losing her balance while tying her shoe. Patient not appearing to have acute cardiopulmonary issues, transthoracic echocardiogram showed an ejection fraction of 60-65%, taken to the OR on 09/17/2013 undergoing ORIF using intramedullary technique. She tolerated procedure well, no complications. Physical therapy consult and social work consult. Patient doing well with with PT. Will likely discharge her to SNF on Sunday.  2. Leukocytosis. Initial lab work showing a white count of 12,000, I suspect secondary to hip fracture. She does not appear to have acute infectious process.  Code Status: Full code Family Communication: Family not present at bedside Disposition Plan: Plan to transition to SNF on Sunday   Consultants:  Orthopedic surgery  Procedures:  Orthopedic repair of left hip fracture   HPI/Subjective: Patient is a 78 year old female with no significant past medical history who was in her usual state health, lost her balance while tying her shoe, fell backwards, injured her left hip. She was unable to bear weight, in the scope brought to the emergency department at [facility]. Imaging studies revealed a displaced oblique fracture of the proximal-mid left femur. She was admitted to the medicine service with orthopedic consultation. Patient having a transthoracic echocardiogram performed on 09/17/2013 given presence of right bundle branch block with no previous EKG to compare with. Transthoracic echocardiogram revealed ejection fraction of 60-65% without wall motion abnormalities. She did have grade 1 diastolic dysfunction. She does not appear to have active cardiopulmonary issues, cleared for surgery. She was taken to the OR on 09/17/2013 for ORIF.  Objective: Filed  Vitals:   09/19/13 1135  BP:   Pulse:   Temp:   Resp: 20    Intake/Output Summary (Last 24 hours) at 09/19/13 1259 Last data filed at 09/19/13 0646  Gross per 24 hour  Intake    360 ml  Output   2150 ml  Net  -1790 ml   Filed Weights   09/17/13 0318  Weight: 74.3 kg (163 lb 12.8 oz)    Exam:   General:  Patient is in no acute shows, awake alert, cooperative, reports pain is well controlled.  Cardiovascular: Regular rate rhythm normal S1-S2  Respiratory: Clear to auscultation  Abdomen: Soft nontender nondistended  Musculoskeletal: Left lower extremity s/p ORIF no evidence of infection or excessive bleed.   Data Reviewed: Basic Metabolic Panel:  Recent Labs Lab 09/16/13 2350 09/17/13 0545 09/18/13 0450 09/19/13 0515  NA 141  --  138 142  K 4.0  --  4.8 4.6  CL 103  --  105 108  CO2  --   --  24 24  GLUCOSE 153*  --  161* 140*  BUN 22  --  16 23  CREATININE 1.00 0.81 0.85 0.82  CALCIUM  --   --  7.7* 8.0*   Liver Function Tests: No results found for this basename: AST, ALT, ALKPHOS, BILITOT, PROT, ALBUMIN,  in the last 168 hours No results found for this basename: LIPASE, AMYLASE,  in the last 168 hours No results found for this basename: AMMONIA,  in the last 168 hours CBC:  Recent Labs Lab 09/16/13 2325 09/16/13 2350 09/17/13 0545 09/18/13 0450 09/19/13 0515  WBC 12.0*  --  8.6 9.0 9.7  NEUTROABS 9.2*  --   --   --   --   HGB 14.4 15.3*  12.4 9.5* 8.7*  HCT 42.2 45.0 38.2 28.0* 25.8*  MCV 90.0  --  92.3 90.0 90.5  PLT 264  --  233 213 195   Cardiac Enzymes: No results found for this basename: CKTOTAL, CKMB, CKMBINDEX, TROPONINI,  in the last 168 hours BNP (last 3 results) No results found for this basename: PROBNP,  in the last 8760 hours CBG: No results found for this basename: GLUCAP,  in the last 168 hours  Recent Results (from the past 240 hour(s))  SURGICAL PCR SCREEN     Status: None   Collection Time    09/17/13  4:08 AM       Result Value Ref Range Status   MRSA, PCR NEGATIVE  NEGATIVE Final   Staphylococcus aureus NEGATIVE  NEGATIVE Final   Comment:            The Xpert SA Assay (FDA     approved for NASAL specimens     in patients over 76 years of age),     is one component of     a comprehensive surveillance     program.  Test performance has     been validated by [facility] [employer]     [employer] for patients greater     than or equal to 76 year old.     It is not intended     to diagnose infection nor to     guide or monitor treatment.     Studies: Dg Femur Left  09/17/2013   CLINICAL DATA:  Fracture left femur with placement of intra medullary rod  EXAM: LEFT FEMUR - 2 VIEW  COMPARISON:  DG KNEE1- 2 VIEWS*L* dated 09/17/2013; DG FEMUR*L* dated 09/17/2013  FINDINGS: There is an intra medullary rod through the left femur. The fracture of the diaphysis is in near anatomic position and alignment. Intra medullary rod is positioned appropriately within the femoral shaft.  Fluoro time:  1 min 24 seconds  IMPRESSION: Status post ORIF left femur fracture   Electronically Signed   By: Esperanza Heiraymond  Rubner M.D.   On: 09/17/2013 15:25   Dg C-arm 1-60 Min-no Report  09/17/2013   CLINICAL DATA: femur surgery   C-ARM 1-60 MINUTES  Fluoroscopy was utilized by the requesting physician.  No radiographic  interpretation.     Scheduled Meds: . docusate sodium  100 mg Oral BID  . enoxaparin (LOVENOX) injection  40 mg Subcutaneous Q24H   Continuous Infusions: . sodium chloride 0.9 % 1,000 mL with potassium chloride 10 mEq infusion Stopped (09/18/13 2031)    Principal Problem:   Femur fracture, left Active Problems:   RBBB   Fall at home    Time spent:35 minutes    [name], Lisa White  [facility] Pager [phone]. If 7PM-7AM, please contact night-coverage at [email or web address], password [email or web address] 09/19/2013, 12:59 PM  LOS: 3 days

## 2013-09-19 NOTE — Progress Notes (Signed)
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  s/p L femur IM nail   PT Comments   Pt making good progress. [facility].  Follow Up Recommendations  SNF     Does the patient have the potential to tolerate intense rehabilitation     Barriers to Discharge        Equipment Recommendations  None recommended by PT    Recommendations for Other Services    Frequency Min 4X/week   Progress towards PT Goals Progress towards PT goals: Progressing toward goals  Plan Current plan remains appropriate    Precautions / Restrictions Precautions Precautions: Fall Restrictions Weight Bearing Restrictions: No LLE Weight Bearing: Partial weight bearing   Pertinent Vitals/Pain L thigh 4-5/10 iced after gait.    Mobility  Bed Mobility Bed Mobility: Supine to Sit Supine to sit: Supervision General bed mobility comments:  (use of rails) Transfers Equipment used: Rolling walker (2 wheeled) Transfers: Sit to/from Stand Sit to Stand: Min guard General transfer comment:  (cues for hand placement) Ambulation/Gait Ambulation/Gait assistance: Min guard Ambulation Distance (Feet): 140 Feet Assistive device: Rolling walker (2 wheeled) Gait Pattern/deviations: Decreased step length - left;Step-to pattern General Gait Details:  (cueing for proper RW placement)    Exercises Total Joint Exercises Ankle Circles/Pumps: AROM;Both;15 reps Quad Sets: AROM;Both;15 reps Gluteal Sets: 10 reps;Supine;Strengthening Heel Slides: AROM;Left;15 reps Hip ABduction/ADduction: AROM;Left;15 reps   PT  Diagnosis:    PT Problem List:   PT Treatment Interventions:     PT Goals (current goals can now be found in the care plan section) Acute Rehab PT Goals PT Goal Formulation: With patient  Visit Information  Last PT Received On: 09/19/13 Assistance Needed: +1 History of Present Illness: 78 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  s/p L femur IM nail    Subjective Data      Cognition  Cognition Arousal/Alertness: Awake/alert Behavior During Therapy: WFL for tasks assessed/performed Overall Cognitive Status: Within Functional Limits for tasks assessed    Balance     End of Session PT - End of Session Equipment Utilized During Treatment: Gait belt Activity Tolerance: Patient tolerated treatment well Patient left: in chair;with call bell/phone within reach;with nursing/sitter in room Nurse Communication: Mobility status   GP     Delma Villalva LUBECK 09/19/2013, 10:54 AM

## 2013-09-20 DIAGNOSIS — W19XXXA Unspecified fall, initial encounter: Secondary | ICD-10-CM | POA: Diagnosis not present

## 2013-09-20 DIAGNOSIS — D62 Acute posthemorrhagic anemia: Secondary | ICD-10-CM | POA: Diagnosis not present

## 2013-09-20 DIAGNOSIS — M1990 Unspecified osteoarthritis, unspecified site: Secondary | ICD-10-CM | POA: Diagnosis not present

## 2013-09-20 DIAGNOSIS — R269 Unspecified abnormalities of gait and mobility: Secondary | ICD-10-CM | POA: Diagnosis not present

## 2013-09-20 DIAGNOSIS — M79609 Pain in unspecified limb: Secondary | ICD-10-CM | POA: Diagnosis not present

## 2013-09-20 DIAGNOSIS — M6281 Muscle weakness (generalized): Secondary | ICD-10-CM | POA: Diagnosis not present

## 2013-09-20 DIAGNOSIS — M818 Other osteoporosis without current pathological fracture: Secondary | ICD-10-CM | POA: Diagnosis not present

## 2013-09-20 DIAGNOSIS — S7290XD Unspecified fracture of unspecified femur, subsequent encounter for closed fracture with routine healing: Secondary | ICD-10-CM | POA: Diagnosis not present

## 2013-09-20 DIAGNOSIS — I10 Essential (primary) hypertension: Secondary | ICD-10-CM | POA: Diagnosis not present

## 2013-09-20 DIAGNOSIS — Z4789 Encounter for other orthopedic aftercare: Secondary | ICD-10-CM | POA: Diagnosis not present

## 2013-09-20 DIAGNOSIS — Z9181 History of falling: Secondary | ICD-10-CM | POA: Diagnosis not present

## 2013-09-20 DIAGNOSIS — I4510 Unspecified right bundle-branch block: Secondary | ICD-10-CM | POA: Diagnosis not present

## 2013-09-20 DIAGNOSIS — D72829 Elevated white blood cell count, unspecified: Secondary | ICD-10-CM | POA: Diagnosis not present

## 2013-09-20 DIAGNOSIS — E785 Hyperlipidemia, unspecified: Secondary | ICD-10-CM | POA: Diagnosis not present

## 2013-09-20 DIAGNOSIS — S7290XA Unspecified fracture of unspecified femur, initial encounter for closed fracture: Secondary | ICD-10-CM | POA: Diagnosis not present

## 2013-09-20 MED ORDER — HYDROCODONE-ACETAMINOPHEN 5-325 MG PO TABS: 1.0000 | ORAL_TABLET | Freq: Four times a day (QID) | ORAL | Status: DC | PRN

## 2013-09-20 NOTE — Progress Notes (Signed)
Per MD, Pt ready for d/c.  Notified RN, Pt and facility.  Pt notified spouse.  Confirmed receipt of d/c summary, as admission arranged by Weekday CSW.  Facility ready to receive Pt.  Arranged for transportation.  Providence CrosbyAmanda Jaymes Hang, LCSWA Clinical Social Work [phone]

## 2013-09-20 NOTE — Progress Notes (Signed)
[facility] called [facility] Armed forces technical officerTammy [facility].

## 2013-09-20 NOTE — Progress Notes (Signed)
Clinical Social Work Department CLINICAL SOCIAL WORK PLACEMENT NOTE 09/20/2013  Patient:  Lisa SpanDAVISON,[name]  Account Number:  [id number] Admit date:  09/16/2013  Clinical Social Worker:  Cori RazorJAMIE Analleli Gierke, LCSW  Date/time:  09/18/2013 08:29 AM  Clinical Social Work is seeking post-discharge placement for this patient at the following level of care:   SKILLED NURSING   (*CSW will update this form in Epic as items are completed)     Patient/family provided with [facility] of Clinical Social Work's list of facilities offering this level of care within the geographic area requested by the patient (or if unable, by the patient's family).  09/18/2013  Patient/family informed of their freedom to choose among providers that offer the needed level of care, that participate in Medicare, Medicaid or managed care program needed by the patient, have an available bed and are willing to accept the patient.    Patient/family informed of [facility]' ownership interest in [facility], as well as of the fact that they are under no obligation to receive care at this facility.  PASARR submitted to EDS on 09/17/2013 PASARR number received from EDS on 09/17/2013  FL2 transmitted to all facilities in geographic area requested by pt/family on  09/18/2013 FL2 transmitted to all facilities within larger geographic area on   Patient informed that his/her managed care company has contracts with or will negotiate with  certain facilities, including the following:     Patient/family informed of bed offers received:  09/18/2013 [facility] Physician recommends and patient chooses bed at    [facility]  09/20/2013 Patient to be transferred to facility by EMS  The following physician request were entered in Epic:   Additional Comments:  Cori RazorJamie Kerigan Narvaez LCSW [phone]

## 2013-09-20 NOTE — Progress Notes (Addendum)
Subjective: 3 Days Post-Op Procedure(s) (LRB): OPEN REDUCTION INTERNAL FIXATION (ORIF) DISTAL FEMUR FRACTURE (Left) Patient reports pain as 4 on 0-10 scale.    Objective: Vital signs in last 24 hours: Temp:  [98 F (36.7 C)-98.5 F (36.9 C)] 98.3 F (36.8 C) (03/01 0500) Pulse Rate:  [91-94] 91 (03/01 0500) Resp:  [17-20] 18 (03/01 0734) BP: (113-165)/(62-71) 165/71 mmHg (03/01 0500) SpO2:  [91 %-95 %] 94 % (03/01 0734)  Intake/Output from previous day: 02/28 0701 - 03/01 0700 In: 480 [P.O.:480] Out: 1101 [Urine:1100; Stool:1] Intake/Output this shift:     Recent Labs  09/18/13 0450 09/19/13 0515  HGB 9.5* 8.7*    Recent Labs  09/18/13 0450 09/19/13 0515  WBC 9.0 9.7  RBC 3.11* 2.85*  HCT 28.0* 25.8*  PLT 213 195    Recent Labs  09/18/13 0450 09/19/13 0515  NA 138 142  K 4.8 4.6  CL 105 108  CO2 24 24  BUN 16 23  CREATININE 0.85 0.82  GLUCOSE 161* 140*  CALCIUM 7.7* 8.0*   No results found for this basename: LABPT, INR,  in the last 72 hours  Neurologically intact Neurovascular intact Sensation intact distally Intact pulses distally Compartment soft  Assessment/Plan: 3 Days Post-Op Procedure(s) (LRB): OPEN REDUCTION INTERNAL FIXATION (ORIF) DISTAL FEMUR FRACTURE (Left) Doing well.   Rupert Azzara C 09/20/2013, 7:45 AM

## 2013-09-20 NOTE — Progress Notes (Signed)
[facility] PROGRESS NOTE  Darrold SpanBarbara White UJW:[id number]:[id number] DOB: Apr 12, 1936 DOA: 09/16/2013 PCP: No primary provider on file.  Assessment/Plan: 1. Left hip fracture. She apparently had a mechanical fall at home, losing her balance while tying her shoe. Patient not appearing to have acute cardiopulmonary issues, transthoracic echocardiogram showed an ejection fraction of 60-65%, taken to the OR on 09/17/2013 undergoing ORIF using intramedullary technique. She tolerated procedure well, no complications. Physical therapy consult and social work consult. Patient doing well with with PT. .  2. Leukocytosis. Initial lab work showing a White count of 12,000, I suspect secondary to hip fracture. She does not appear to have acute infectious process.  Code Status: Full code Family Communication: Family not present at bedside Disposition Plan: Plan to transition to SNF today   Consultants:  Orthopedic surgery  Procedures:  Orthopedic repair of left hip fracture   HPI/Subjective: Patient is a 78 year old female with no significant past medical history who was in her usual state health, lost her balance while tying her shoe, fell backwards, injured her left hip. She was unable to bear weight, in the scope brought to the emergency department at [facility]. Imaging studies revealed a displaced oblique fracture of the proximal-mid left femur. She was admitted to the medicine service with orthopedic consultation. Patient having a transthoracic echocardiogram performed on 09/17/2013 given presence of right bundle branch block with no previous EKG to compare with. Transthoracic echocardiogram revealed ejection fraction of 60-65% without wall motion abnormalities. She did have grade 1 diastolic dysfunction. She does not appear to have active cardiopulmonary issues, cleared for surgery. She was taken to the OR on 09/17/2013 for ORIF.  Objective: Filed Vitals:   09/20/13 0734  BP:   Pulse:    Temp:   Resp: 18    Intake/Output Summary (Last 24 hours) at 09/20/13 1003 Last data filed at 09/20/13 0500  Gross per 24 hour  Intake    480 ml  Output   1101 ml  Net   -621 ml   Filed Weights   09/17/13 0318  Weight: 74.3 kg (163 lb 12.8 oz)    Exam:   General:  Patient is in no acute shows, awake alert, cooperative, reports pain is well controlled.  Cardiovascular: Regular rate rhythm normal S1-S2  Respiratory: Clear to auscultation  Abdomen: Soft nontender nondistended  Musculoskeletal: Left lower extremity s/p ORIF no evidence of infection or excessive bleed.   Data Reviewed: Basic Metabolic Panel:  Recent Labs Lab 09/16/13 2350 09/17/13 0545 09/18/13 0450 09/19/13 0515  NA 141  --  138 142  K 4.0  --  4.8 4.6  CL 103  --  105 108  CO2  --   --  24 24  GLUCOSE 153*  --  161* 140*  BUN 22  --  16 23  CREATININE 1.00 0.81 0.85 0.82  CALCIUM  --   --  7.7* 8.0*   Liver Function Tests: No results found for this basename: AST, ALT, ALKPHOS, BILITOT, PROT, ALBUMIN,  in the last 168 hours No results found for this basename: LIPASE, AMYLASE,  in the last 168 hours No results found for this basename: AMMONIA,  in the last 168 hours CBC:  Recent Labs Lab 09/16/13 2325 09/16/13 2350 09/17/13 0545 09/18/13 0450 09/19/13 0515  WBC 12.0*  --  8.6 9.0 9.7  NEUTROABS 9.2*  --   --   --   --   HGB 14.4 15.3* 12.4 9.5* 8.7*  HCT 42.2 45.0  38.2 28.0* 25.8*  MCV 90.0  --  92.3 90.0 90.5  PLT 264  --  233 213 195   Cardiac Enzymes: No results found for this basename: CKTOTAL, CKMB, CKMBINDEX, TROPONINI,  in the last 168 hours BNP (last 3 results) No results found for this basename: PROBNP,  in the last 8760 hours CBG: No results found for this basename: GLUCAP,  in the last 168 hours  Recent Results (from the past 240 hour(s))  SURGICAL PCR SCREEN     Status: None   Collection Time    09/17/13  4:08 AM      Result Value Ref Range Status   MRSA, PCR  NEGATIVE  NEGATIVE Final   Staphylococcus aureus NEGATIVE  NEGATIVE Final   Comment:            The Xpert SA Assay (FDA     approved for NASAL specimens     in patients over 3 years of age),     is one component of     a comprehensive surveillance     program.  Test performance has     been validated by [employer] for patients greater     than or equal to 31 year old.     It is not intended     to diagnose infection nor to     guide or monitor treatment.     Studies: No results found.  Scheduled Meds: . docusate sodium  100 mg Oral BID  . enoxaparin (LOVENOX) injection  40 mg Subcutaneous Q24H   Continuous Infusions: . sodium chloride 0.9 % 1,000 mL with potassium chloride 10 mEq infusion Stopped (09/18/13 2031)    Principal Problem:   Femur fracture, left Active Problems:   RBBB   Fall at home    Time spent:35 minutes    [name]  [facility] Pager [phone]. If 7PM-7AM, please contact night-coverage at [email or web address], password [facility] 09/20/2013, 10:03 AM  LOS: 4 days

## 2013-09-21 ENCOUNTER — Other Ambulatory Visit: Payer: Self-pay | Admitting: *Deleted

## 2013-09-21 ENCOUNTER — Encounter: Payer: Self-pay | Admitting: Adult Health

## 2013-09-21 MED ORDER — HYDROCODONE-ACETAMINOPHEN 5-325 MG PO TABS: ORAL_TABLET | ORAL | Status: DC

## 2013-09-21 MED ORDER — HYDROCODONE-ACETAMINOPHEN 5-325 MG PO TABS: 1.0000 | ORAL_TABLET | Freq: Four times a day (QID) | ORAL | Status: DC | PRN

## 2013-09-21 NOTE — Anesthesia Postprocedure Evaluation (Signed)
  Anesthesia Post-op Note  Patient: Lisa SpanBarbara White  Procedure(s) Performed: Procedure(s) (LRB): OPEN REDUCTION INTERNAL FIXATION (ORIF) DISTAL FEMUR FRACTURE (Left)  Patient Location: PACU  Anesthesia Type: General  Level of Consciousness: awake and alert   Airway and Oxygen Therapy: Patient Spontanous Breathing  Post-op Pain: mild  Post-op Assessment: Post-op Vital signs reviewed, Patient's Cardiovascular Status Stable, Respiratory Function Stable, Patent Airway and No signs of Nausea or vomiting  Last Vitals:  Filed Vitals:   09/20/13 1136  BP:   Pulse:   Temp:   Resp: 18    Post-op Vital Signs: stable   Complications: No apparent anesthesia complications

## 2013-09-21 NOTE — Telephone Encounter (Signed)
[facility] 

## 2013-09-25 ENCOUNTER — Encounter: Payer: Self-pay | Admitting: *Deleted

## 2013-09-26 ENCOUNTER — Non-Acute Institutional Stay (SKILLED_NURSING_FACILITY): Payer: Medicare Other | Admitting: Internal Medicine

## 2013-09-26 ENCOUNTER — Encounter: Payer: Self-pay | Admitting: Internal Medicine

## 2013-09-26 DIAGNOSIS — E785 Hyperlipidemia, unspecified: Secondary | ICD-10-CM | POA: Diagnosis not present

## 2013-09-26 DIAGNOSIS — S7290XA Unspecified fracture of unspecified femur, initial encounter for closed fracture: Secondary | ICD-10-CM

## 2013-09-26 DIAGNOSIS — D62 Acute posthemorrhagic anemia: Secondary | ICD-10-CM | POA: Diagnosis not present

## 2013-09-26 DIAGNOSIS — I10 Essential (primary) hypertension: Secondary | ICD-10-CM

## 2013-09-26 DIAGNOSIS — I4510 Unspecified right bundle-branch block: Secondary | ICD-10-CM

## 2013-09-26 DIAGNOSIS — S7292XA Unspecified fracture of left femur, initial encounter for closed fracture: Secondary | ICD-10-CM

## 2013-09-26 NOTE — Assessment & Plan Note (Signed)
Continue lipitor 20 mg 

## 2013-09-26 NOTE — Assessment & Plan Note (Signed)
S/p ORIF 2/26;admitted for OT/PT; pain not much of a problem; on lovenox as prophylaxis

## 2013-09-26 NOTE — Assessment & Plan Note (Signed)
Discovered on pre-op w/u; ECHO 60-65% EF, nl wall motion with grade 1 diastolic dysfunction

## 2013-09-26 NOTE — Progress Notes (Signed)
MRN: [id number] Name: Lisa White  Sex: female Age: 78 y.o. DOB: 12/02/1935  PSC #: [facility] Facility/Room: 1102-2 Level Of Care: SNF Provider: Merrilee Seashore D Emergency Contacts: Extended Emergency Contact Information Primary Emergency Contact: Grindstaff,Raymond Address: [address] [place] Home Phone: [phone] Relation: Spouse Secondary Emergency Contact: [facility] Address: [address]          [place], [place] [address] [place] Home Phone: [phone] Relation: Friend     Allergies: Review of patient's allergies indicates no known allergies.  Chief Complaint  Patient presents with  . nursing home admission    HPI: Patient is 78 y.o. female who is admitted for OT/PT s/p a fumur fx from a fall at home.  Past Medical History  Diagnosis Date  . Arthritis   . Hyperlipidemia   . Hypertension     Past Surgical History  Procedure Laterality Date  . Tonsillectomy    . Appendectomy    . Orif femur fracture Left 09/17/2013    Procedure: OPEN REDUCTION INTERNAL FIXATION (ORIF) DISTAL FEMUR FRACTURE;  Surgeon: Shelda Pal, MD;  Location: [facility];  Service: Orthopedics;  Laterality: Left;      Medication List       This list is accurate as of: 09/26/13  9:32 PM.  Always use your most recent med list.               alendronate 70 MG tablet  Commonly known as:  FOSAMAX  Take 70 mg by mouth once a week. For Osteoporosis     alum & mag hydroxide-simeth 200-200-20 MG/5ML suspension  Commonly known as:  MAALOX/MYLANTA  Take 30 mLs by mouth every 4 (four) hours as needed for indigestion.     amLODipine 10 MG tablet  Commonly known as:  NORVASC  Take 10 mg by mouth at bedtime. For HTN     aspirin EC 81 MG tablet  Take 81 mg by mouth at bedtime. For prophylaxis     atorvastatin 20 MG tablet  Commonly known as:  LIPITOR  Take 20 mg by mouth at bedtime. For hyperlipidemia     DSS 100 MG  Caps  Take 100 mg by mouth 2 (two) times daily. For constipation     enoxaparin 40 MG/0.4ML injection  Commonly known as:  LOVENOX  Inject 40 mg into the skin daily. For DVT/prophylaxis     HYDROcodone-acetaminophen 5-325 MG per tablet  Commonly known as:  NORCO  Take one tablet by mouth every 6 hours as needed for mild to moderate pain; Take two tablets by mouth every 6 hours as needed for moderate to severe pain        No orders of the defined types were placed in this encounter.     There is no immunization history on file for this patient.  History  Substance Use Topics  . Smoking status: Never Smoker   . Smokeless tobacco: Never Used  . Alcohol Use: No    Family history is noncontributory    Review of Systems  DATA OBTAINED: from patient;pt has no c/o GENERAL: Feels well no fevers, fatigue, appetite changes SKIN: No itching, rash or wounds EYES: No eye pain, redness, discharge EARS: No earache, tinnitus, change in hearing NOSE: No congestion, drainage or bleeding  MOUTH/THROAT: No mouth or tooth pain, No sore throat, No difficulty chewing or swallowing  RESPIRATORY: No cough, wheezing, SOB CARDIAC:  No chest pain, palpitations, lower extremity edema  GI: No abdominal pain, No N/V/D or constipation, No heartburn or reflux  GU: No dysuria, frequency or urgency, or incontinence  MUSCULOSKELETAL: No unrelieved bone/joint pain NEUROLOGIC: No headache, dizziness or focal weakness PSYCHIATRIC: No overt anxiety or sadness. Sleeps well. No behavior issue.   Filed Vitals:   09/26/13 2115  BP: 105/53  Pulse: 90  Temp: 98.4 F (36.9 C)  Resp: 20    Physical Exam  GENERAL APPEARANCE: Alert, conversant. Appropriately groomed. No acute distress, cheerful, pleasant SKIN: No diaphoresis rash, or wounds HEAD: Normocephalic, atraumatic  EYES: Conjunctiva/lids clear. Pupils round, reactive. EOMs intact.  EARS: External exam WNL, canals clear. Hearing grossly normal.   NOSE: No deformity or discharge.  MOUTH/THROAT: Lips w/o lesions. RESPIRATORY: Breathing is even, unlabored. Lung sounds are clear   CARDIOVASCULAR: Heart RRR no murmurs, rubs or gallops. No peripheral edema.  ARTERIAL: radial pulse 2+, DP pulse 1+  GASTROINTESTINAL: Abdomen is soft, non-tender, not distended w/ normal bowel sounds GENITOURINARY: Bladder non tender, not distended  MUSCULOSKELETAL: mild swelling and resolving dependent bruising L leg with 2 op sits proximal and distal femur with op dressings in place NEUROLOGIC: Oriented X3. Cranial nerves 2-12 grossly intact. Moves all extremities no tremor. PSYCHIATRIC: Mood and affect appropriate to situation, no behavioral issues  Patient Active Problem List   Diagnosis Date Noted  . Acute blood loss anemia 09/26/2013  . Hyperlipidemia   . Hypertension   . Femur fracture, left 09/17/2013  . RBBB 09/17/2013  . Fall at home 09/17/2013    CBC    Component Value Date/Time   WBC 9.7 09/19/2013 0515   RBC 2.85* 09/19/2013 0515   HGB 8.7* 09/19/2013 0515   HCT 25.8* 09/19/2013 0515   PLT 195 09/19/2013 0515   MCV 90.5 09/19/2013 0515   LYMPHSABS 2.0 09/16/2013 2325   MONOABS 0.6 09/16/2013 2325   EOSABS 0.2 09/16/2013 2325   BASOSABS 0.0 09/16/2013 2325    CMP     Component Value Date/Time   NA 142 09/19/2013 0515   K 4.6 09/19/2013 0515   CL 108 09/19/2013 0515   CO2 24 09/19/2013 0515   GLUCOSE 140* 09/19/2013 0515   BUN 23 09/19/2013 0515   CREATININE 0.82 09/19/2013 0515   CALCIUM 8.0* 09/19/2013 0515   GFRNONAA 68* 09/19/2013 0515   GFRAA 79* 09/19/2013 0515    Assessment and Plan  Femur fracture, left S/p ORIF 2/26;admitted for OT/PT; pain not much of a problem; on lovenox as prophylaxis  Acute blood loss anemia Pre-op Hb 12.4, post -op 8.7; recheck 3/9  Hyperlipidemia Continue lipitor 20 mg  Hypertension Continue Norvasc 5 mg daily  RBBB Discovered on pre-op w/u; ECHO 60-65% EF, nl wall motion with grade 1 diastolic  dysfunction    Margit HanksALEXANDER, ANNE D, MD

## 2013-09-26 NOTE — Assessment & Plan Note (Signed)
Pre-op Hb 12.4, post -op 8.7; recheck 3/9

## 2013-09-26 NOTE — Assessment & Plan Note (Signed)
Continue Norvasc 5 mg daily

## 2013-10-05 DIAGNOSIS — S72309A Unspecified fracture of shaft of unspecified femur, initial encounter for closed fracture: Secondary | ICD-10-CM | POA: Diagnosis not present

## 2013-10-09 DIAGNOSIS — S72309A Unspecified fracture of shaft of unspecified femur, initial encounter for closed fracture: Secondary | ICD-10-CM | POA: Diagnosis not present

## 2013-10-13 DIAGNOSIS — S72309A Unspecified fracture of shaft of unspecified femur, initial encounter for closed fracture: Secondary | ICD-10-CM | POA: Diagnosis not present

## 2013-10-15 DIAGNOSIS — S72309A Unspecified fracture of shaft of unspecified femur, initial encounter for closed fracture: Secondary | ICD-10-CM | POA: Diagnosis not present

## 2013-10-20 DIAGNOSIS — S72309A Unspecified fracture of shaft of unspecified femur, initial encounter for closed fracture: Secondary | ICD-10-CM | POA: Diagnosis not present

## 2013-10-22 DIAGNOSIS — S72309A Unspecified fracture of shaft of unspecified femur, initial encounter for closed fracture: Secondary | ICD-10-CM | POA: Diagnosis not present

## 2013-10-27 DIAGNOSIS — S72309A Unspecified fracture of shaft of unspecified femur, initial encounter for closed fracture: Secondary | ICD-10-CM | POA: Diagnosis not present

## 2013-10-29 DIAGNOSIS — S72309A Unspecified fracture of shaft of unspecified femur, initial encounter for closed fracture: Secondary | ICD-10-CM | POA: Diagnosis not present

## 2013-11-03 DIAGNOSIS — S72309A Unspecified fracture of shaft of unspecified femur, initial encounter for closed fracture: Secondary | ICD-10-CM | POA: Diagnosis not present

## 2013-11-05 DIAGNOSIS — M899 Disorder of bone, unspecified: Secondary | ICD-10-CM | POA: Diagnosis not present

## 2013-11-05 DIAGNOSIS — S72309A Unspecified fracture of shaft of unspecified femur, initial encounter for closed fracture: Secondary | ICD-10-CM | POA: Diagnosis not present

## 2013-11-05 DIAGNOSIS — R7301 Impaired fasting glucose: Secondary | ICD-10-CM | POA: Diagnosis not present

## 2013-11-05 DIAGNOSIS — I10 Essential (primary) hypertension: Secondary | ICD-10-CM | POA: Diagnosis not present

## 2013-11-05 DIAGNOSIS — S7290XA Unspecified fracture of unspecified femur, initial encounter for closed fracture: Secondary | ICD-10-CM | POA: Diagnosis not present

## 2013-11-05 DIAGNOSIS — E559 Vitamin D deficiency, unspecified: Secondary | ICD-10-CM | POA: Diagnosis not present

## 2013-11-05 DIAGNOSIS — E782 Mixed hyperlipidemia: Secondary | ICD-10-CM | POA: Diagnosis not present

## 2013-11-06 DIAGNOSIS — S7290XD Unspecified fracture of unspecified femur, subsequent encounter for closed fracture with routine healing: Secondary | ICD-10-CM | POA: Diagnosis not present

## 2013-11-10 DIAGNOSIS — S7290XD Unspecified fracture of unspecified femur, subsequent encounter for closed fracture with routine healing: Secondary | ICD-10-CM | POA: Diagnosis not present

## 2013-11-12 DIAGNOSIS — S7290XD Unspecified fracture of unspecified femur, subsequent encounter for closed fracture with routine healing: Secondary | ICD-10-CM | POA: Diagnosis not present

## 2013-11-17 DIAGNOSIS — S72309A Unspecified fracture of shaft of unspecified femur, initial encounter for closed fracture: Secondary | ICD-10-CM | POA: Diagnosis not present

## 2013-11-17 DIAGNOSIS — S7290XD Unspecified fracture of unspecified femur, subsequent encounter for closed fracture with routine healing: Secondary | ICD-10-CM | POA: Diagnosis not present

## 2013-11-19 DIAGNOSIS — S72309A Unspecified fracture of shaft of unspecified femur, initial encounter for closed fracture: Secondary | ICD-10-CM | POA: Diagnosis not present

## 2013-11-24 DIAGNOSIS — S7290XD Unspecified fracture of unspecified femur, subsequent encounter for closed fracture with routine healing: Secondary | ICD-10-CM | POA: Diagnosis not present

## 2013-11-26 DIAGNOSIS — S7290XD Unspecified fracture of unspecified femur, subsequent encounter for closed fracture with routine healing: Secondary | ICD-10-CM | POA: Diagnosis not present

## 2013-12-01 DIAGNOSIS — S7290XD Unspecified fracture of unspecified femur, subsequent encounter for closed fracture with routine healing: Secondary | ICD-10-CM | POA: Diagnosis not present

## 2013-12-03 DIAGNOSIS — S7290XD Unspecified fracture of unspecified femur, subsequent encounter for closed fracture with routine healing: Secondary | ICD-10-CM | POA: Diagnosis not present

## 2013-12-17 DIAGNOSIS — M25559 Pain in unspecified hip: Secondary | ICD-10-CM | POA: Diagnosis not present

## 2013-12-31 DIAGNOSIS — S7290XD Unspecified fracture of unspecified femur, subsequent encounter for closed fracture with routine healing: Secondary | ICD-10-CM | POA: Diagnosis not present

## 2013-12-31 DIAGNOSIS — M25559 Pain in unspecified hip: Secondary | ICD-10-CM | POA: Diagnosis not present

## 2014-03-08 DIAGNOSIS — I10 Essential (primary) hypertension: Secondary | ICD-10-CM | POA: Diagnosis not present

## 2014-03-08 DIAGNOSIS — M949 Disorder of cartilage, unspecified: Secondary | ICD-10-CM | POA: Diagnosis not present

## 2014-03-08 DIAGNOSIS — Z1331 Encounter for screening for depression: Secondary | ICD-10-CM | POA: Diagnosis not present

## 2014-03-08 DIAGNOSIS — E782 Mixed hyperlipidemia: Secondary | ICD-10-CM | POA: Diagnosis not present

## 2014-03-08 DIAGNOSIS — Z0000 Encounter for general adult medical examination without abnormal findings: Secondary | ICD-10-CM | POA: Diagnosis not present

## 2014-03-08 DIAGNOSIS — R7301 Impaired fasting glucose: Secondary | ICD-10-CM | POA: Diagnosis not present

## 2014-03-08 DIAGNOSIS — E559 Vitamin D deficiency, unspecified: Secondary | ICD-10-CM | POA: Diagnosis not present

## 2014-03-08 DIAGNOSIS — Z23 Encounter for immunization: Secondary | ICD-10-CM | POA: Diagnosis not present

## 2014-03-08 DIAGNOSIS — M899 Disorder of bone, unspecified: Secondary | ICD-10-CM | POA: Diagnosis not present

## 2014-03-08 DIAGNOSIS — L2089 Other atopic dermatitis: Secondary | ICD-10-CM | POA: Diagnosis not present

## 2014-04-09 NOTE — Progress Notes (Signed)
This encounter was created in error - please disregard.

## 2014-04-13 DIAGNOSIS — M899 Disorder of bone, unspecified: Secondary | ICD-10-CM | POA: Diagnosis not present

## 2014-04-13 DIAGNOSIS — Z803 Family history of malignant neoplasm of breast: Secondary | ICD-10-CM | POA: Diagnosis not present

## 2014-04-13 DIAGNOSIS — M949 Disorder of cartilage, unspecified: Secondary | ICD-10-CM | POA: Diagnosis not present

## 2014-04-13 DIAGNOSIS — Z1231 Encounter for screening mammogram for malignant neoplasm of breast: Secondary | ICD-10-CM | POA: Diagnosis not present

## 2014-04-21 DIAGNOSIS — Z23 Encounter for immunization: Secondary | ICD-10-CM | POA: Diagnosis not present

## 2014-04-29 DIAGNOSIS — M25561 Pain in right knee: Secondary | ICD-10-CM | POA: Diagnosis not present

## 2014-06-01 DIAGNOSIS — H2513 Age-related nuclear cataract, bilateral: Secondary | ICD-10-CM | POA: Diagnosis not present

## 2015-02-07 IMAGING — CR DG KNEE 1-2V*L*
2 series · 2 of 2 positions shown · non-contrast
Comparison: None.

CLINICAL DATA: Fall

EXAM:
LEFT KNEE - 1-2 VIEW

[x knee ap left]
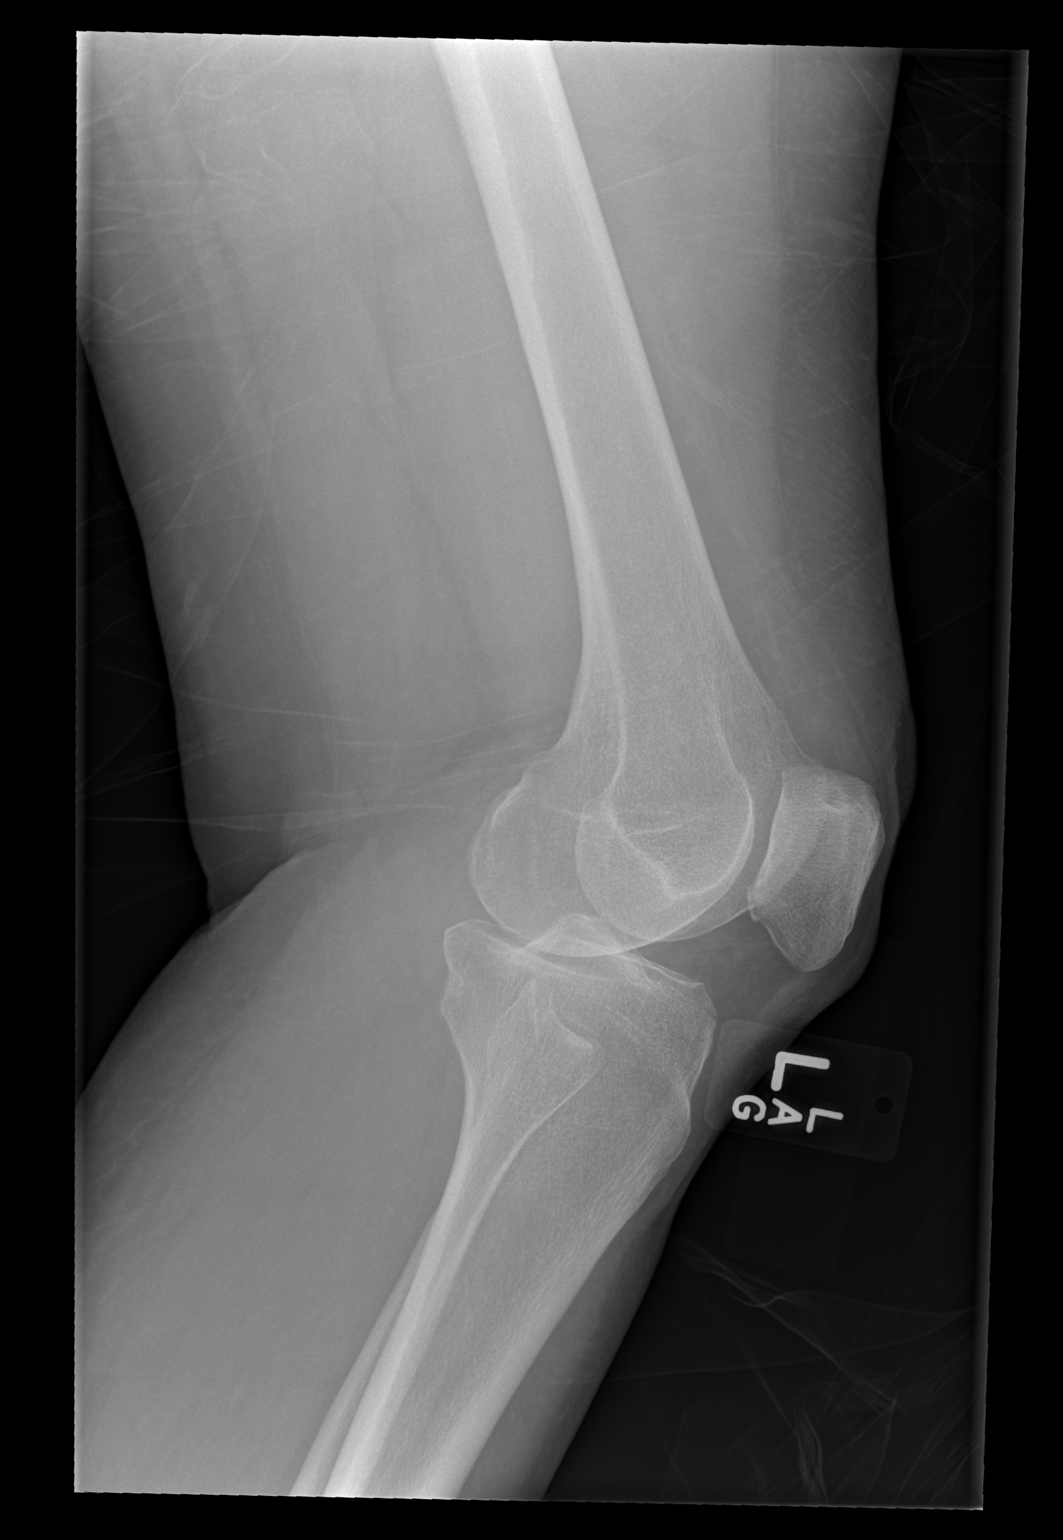

[x knee lat left]
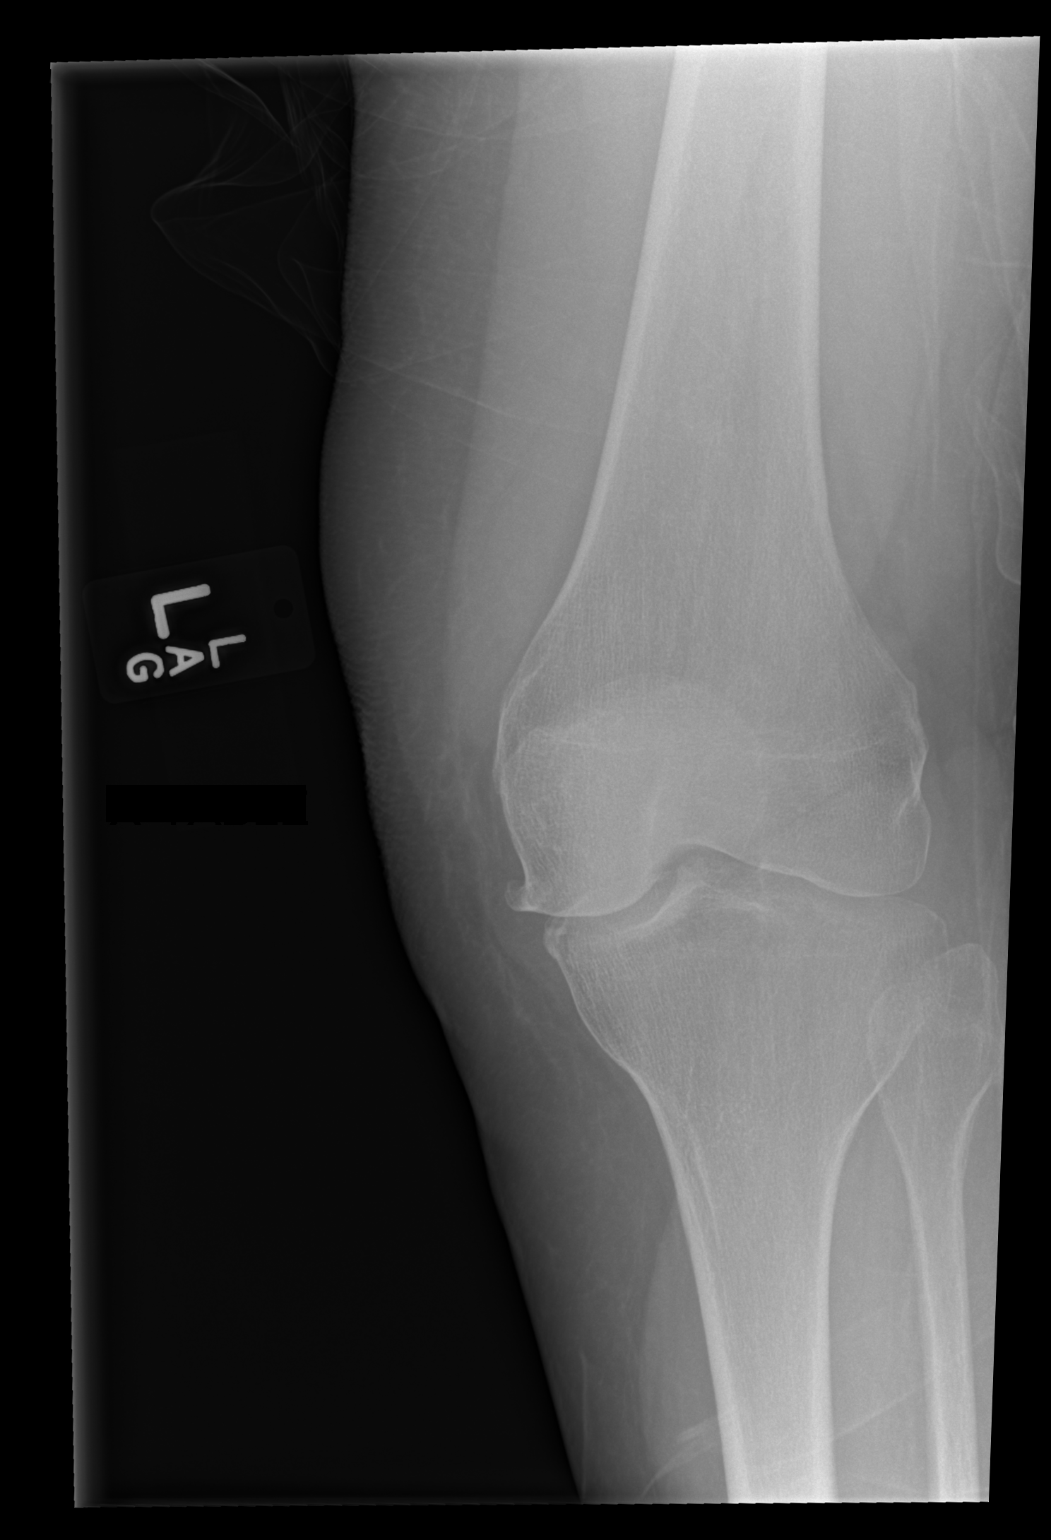

[2 of 2 positions shown; findings below may reference images not displayed]

FINDINGS: No acute fracture or dislocation. No joint effusion. Moderate joint
space narrowing with juxta-articular osteophytosis present within
the medial femorotibial joint space compartment. No soft tissue
abnormality.
IMPRESSION: 1. No acute fracture or dislocation.
2. Moderate degenerative osteoarthrosis within the medial
femorotibial joint space compartment.

## 2015-02-07 IMAGING — RF DG FEMUR 2V*L*
1 series · 6 of 6 positions shown · non-contrast
Comparison: DG [REDACTED]VIEWS*L* dated 09/17/2013; DG FEMUR*L* dated
09/17/2013

CLINICAL DATA: Fracture left femur with placement of intra
medullary rod

EXAM:
LEFT FEMUR - 2 VIEW

[Series 1: run · 6 of 6 slices shown]
[im 1/6]
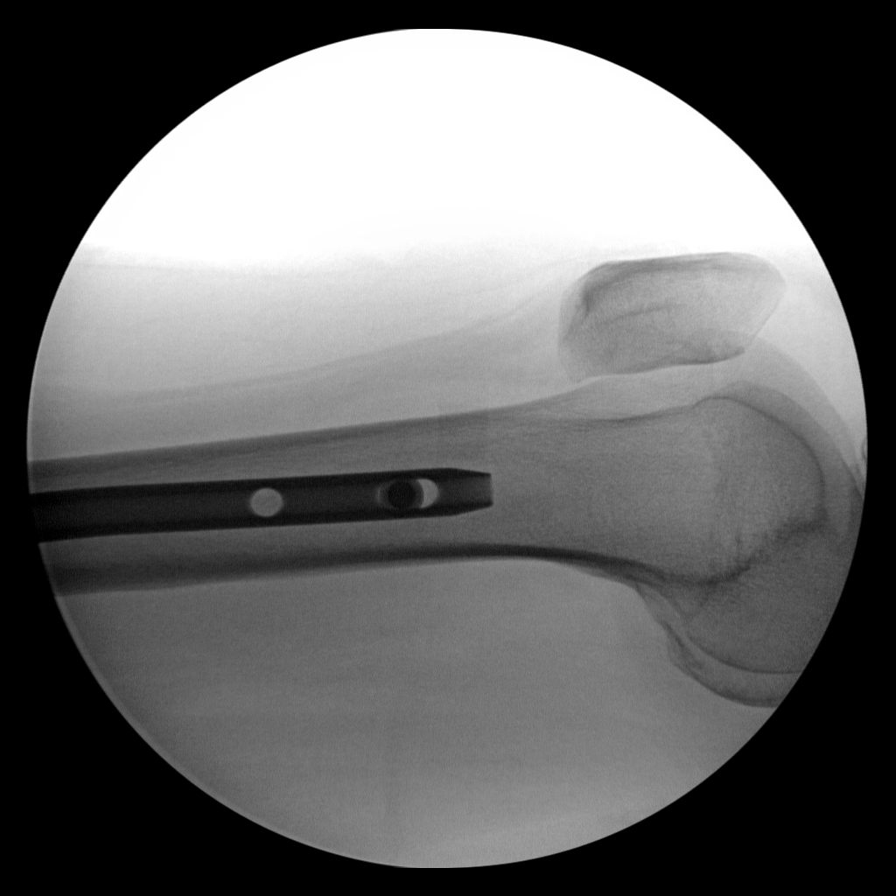
[im 2/6]
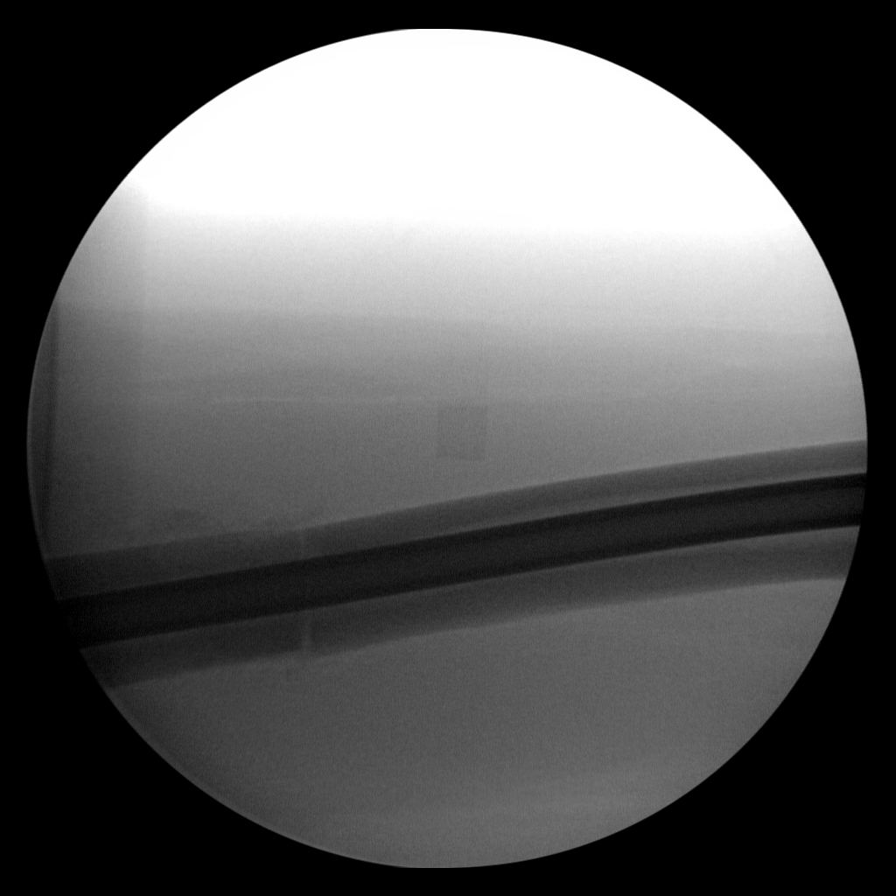
[im 3/6]
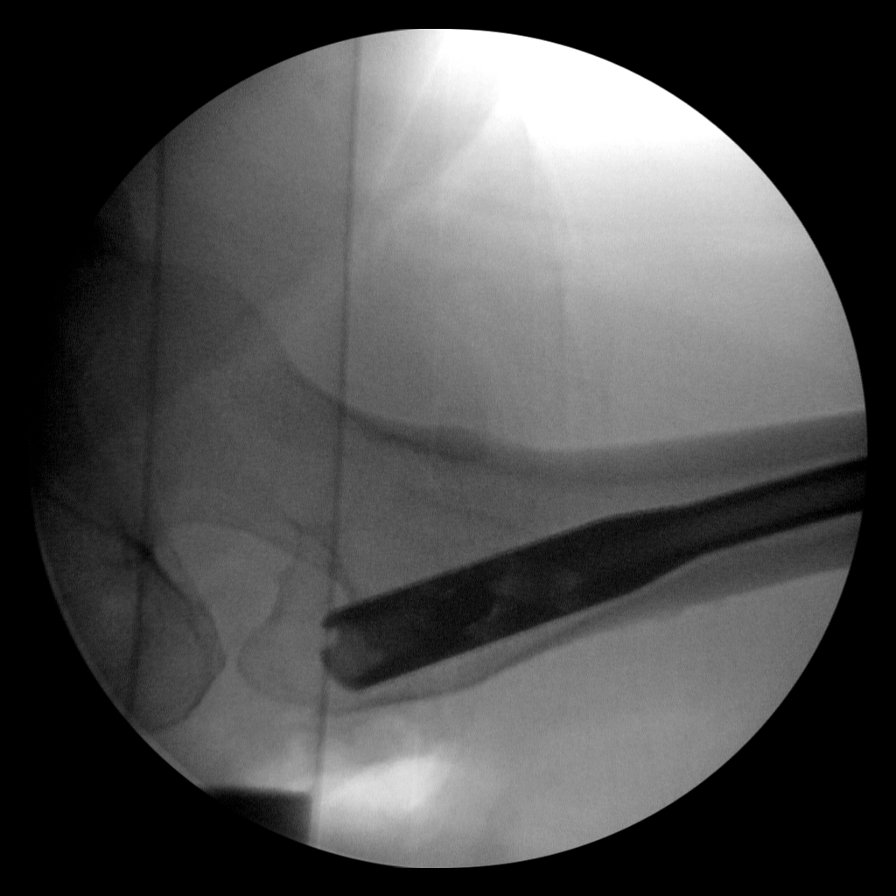
[im 4/6]
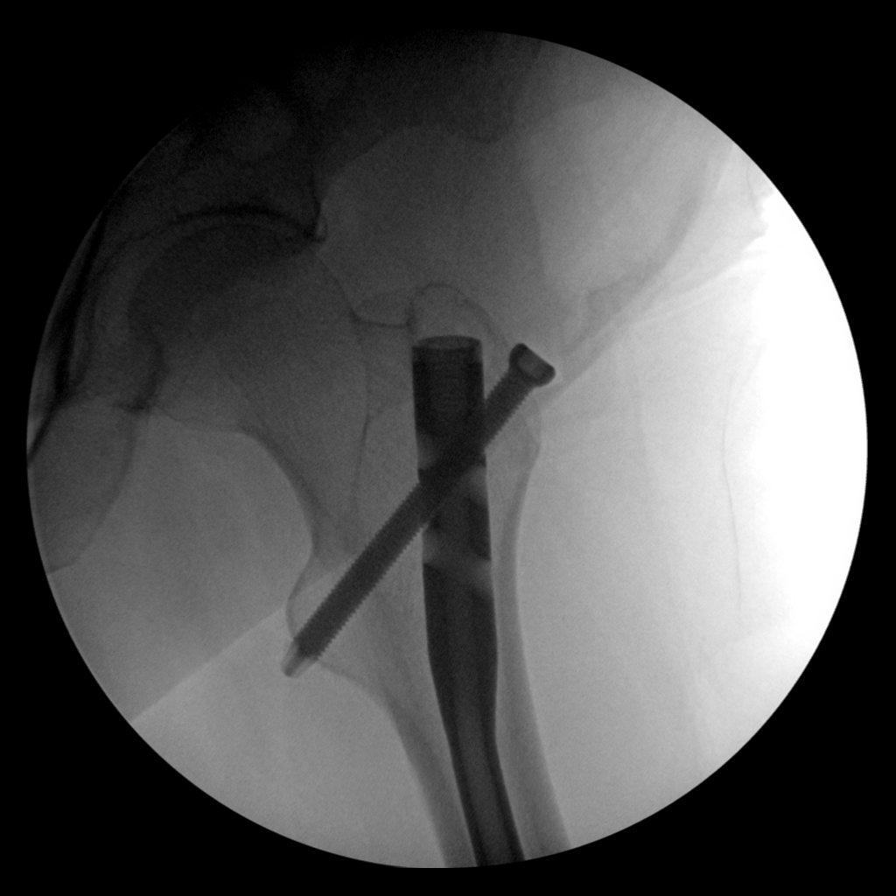
[im 5/6]
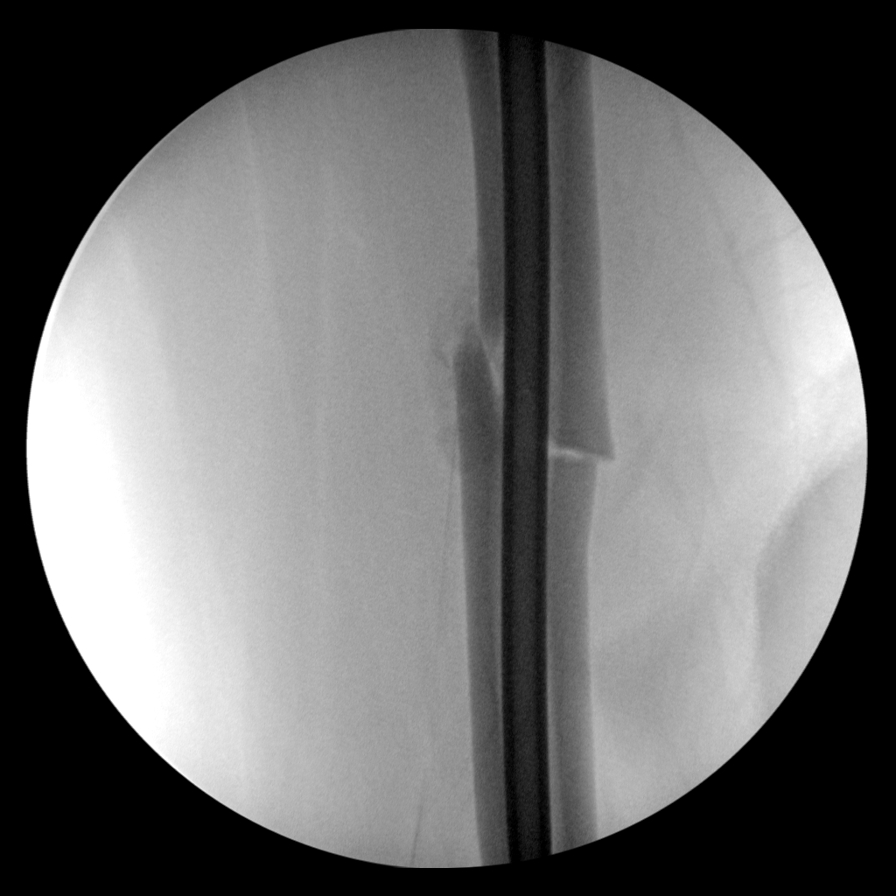
[im 6/6]
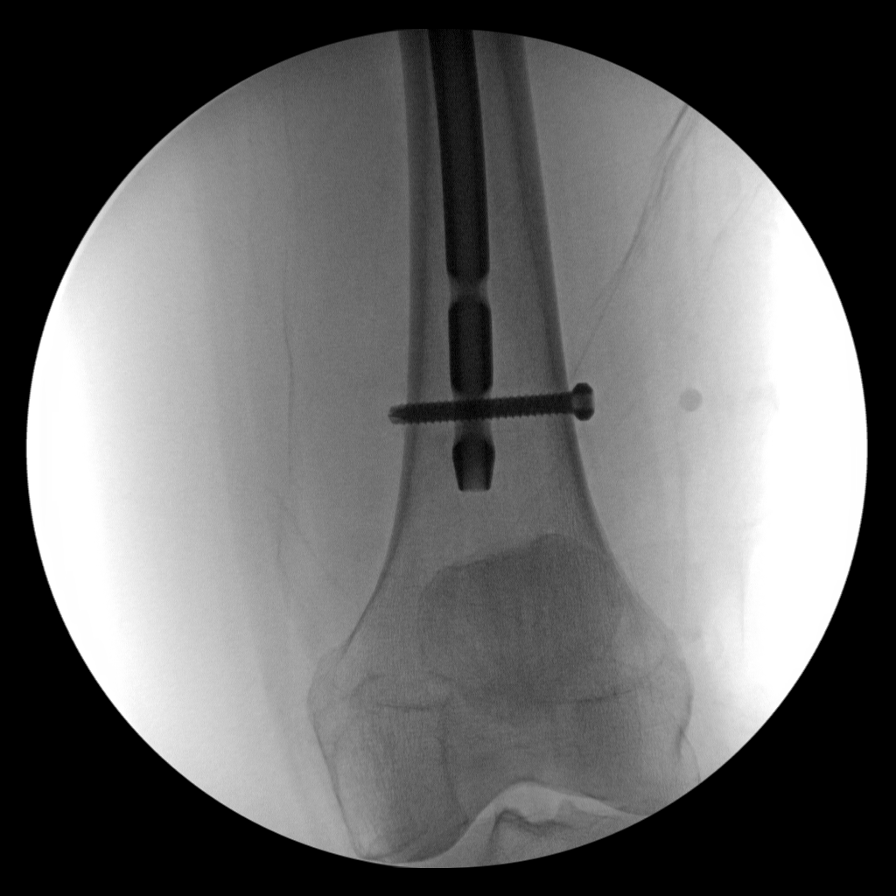

[6 of 6 positions shown; findings below may reference images not displayed]

FINDINGS: There is an intra medullary rod through the left femur. The fracture
of the diaphysis is in near anatomic position and alignment. Intra
medullary rod is positioned appropriately within the femoral shaft.

Fluoro time:  1 min 24 seconds
IMPRESSION: Status post ORIF left femur fracture

## 2015-02-17 ENCOUNTER — Emergency Department (HOSPITAL_COMMUNITY): Payer: Medicare Other

## 2015-02-17 ENCOUNTER — Inpatient Hospital Stay (HOSPITAL_COMMUNITY)
Admission: EM | Admit: 2015-02-17 | Discharge: 2015-02-21 | DRG: 482 | Disposition: A | Discharge status: Skilled Nursing Facility | Payer: Medicare Other | Attending: Internal Medicine | Admitting: Internal Medicine

## 2015-02-17 ENCOUNTER — Encounter (HOSPITAL_COMMUNITY): Payer: Self-pay | Admitting: *Deleted

## 2015-02-17 DIAGNOSIS — E785 Hyperlipidemia, unspecified: Secondary | ICD-10-CM | POA: Diagnosis present

## 2015-02-17 DIAGNOSIS — Z803 Family history of malignant neoplasm of breast: Secondary | ICD-10-CM

## 2015-02-17 DIAGNOSIS — Z8249 Family history of ischemic heart disease and other diseases of the circulatory system: Secondary | ICD-10-CM

## 2015-02-17 DIAGNOSIS — I10 Essential (primary) hypertension: Secondary | ICD-10-CM | POA: Diagnosis not present

## 2015-02-17 DIAGNOSIS — Z79899 Other long term (current) drug therapy: Secondary | ICD-10-CM

## 2015-02-17 DIAGNOSIS — W010XXA Fall on same level from slipping, tripping and stumbling without subsequent striking against object, initial encounter: Secondary | ICD-10-CM | POA: Diagnosis present

## 2015-02-17 DIAGNOSIS — R000 Tachycardia, unspecified: Secondary | ICD-10-CM | POA: Diagnosis not present

## 2015-02-17 DIAGNOSIS — S72391A Other fracture of shaft of right femur, initial encounter for closed fracture: Principal | ICD-10-CM | POA: Diagnosis present

## 2015-02-17 DIAGNOSIS — Z823 Family history of stroke: Secondary | ICD-10-CM

## 2015-02-17 DIAGNOSIS — Y92000 Kitchen of unspecified non-institutional (private) residence as  the place of occurrence of the external cause: Secondary | ICD-10-CM

## 2015-02-17 DIAGNOSIS — T148 Other injury of unspecified body region: Secondary | ICD-10-CM | POA: Diagnosis not present

## 2015-02-17 DIAGNOSIS — Z01818 Encounter for other preprocedural examination: Secondary | ICD-10-CM | POA: Diagnosis not present

## 2015-02-17 DIAGNOSIS — Z7982 Long term (current) use of aspirin: Secondary | ICD-10-CM

## 2015-02-17 DIAGNOSIS — M79604 Pain in right leg: Secondary | ICD-10-CM | POA: Diagnosis not present

## 2015-02-17 DIAGNOSIS — M25551 Pain in right hip: Secondary | ICD-10-CM | POA: Diagnosis not present

## 2015-02-17 DIAGNOSIS — E875 Hyperkalemia: Secondary | ICD-10-CM | POA: Diagnosis not present

## 2015-02-17 DIAGNOSIS — I4510 Unspecified right bundle-branch block: Secondary | ICD-10-CM | POA: Diagnosis not present

## 2015-02-17 DIAGNOSIS — S79091A Other physeal fracture of upper end of right femur, initial encounter for closed fracture: Secondary | ICD-10-CM | POA: Diagnosis not present

## 2015-02-17 DIAGNOSIS — W19XXXA Unspecified fall, initial encounter: Secondary | ICD-10-CM

## 2015-02-17 DIAGNOSIS — S7291XA Unspecified fracture of right femur, initial encounter for closed fracture: Secondary | ICD-10-CM | POA: Diagnosis present

## 2015-02-17 DIAGNOSIS — Y92009 Unspecified place in unspecified non-institutional (private) residence as the place of occurrence of the external cause: Secondary | ICD-10-CM

## 2015-02-17 DIAGNOSIS — S72301A Unspecified fracture of shaft of right femur, initial encounter for closed fracture: Secondary | ICD-10-CM | POA: Diagnosis not present

## 2015-02-17 NOTE — ED Notes (Signed)
Per EMS, pt was walking from tile floor to hardwood floor at home where she slipped and fell. She has inner rotation to rt leg.  Access was gained and she received of Fentanyl in route to ED. VS: P:78 BP: 176/87, SpO2: 97% 2L. Prior to placing on oxygen, pt was saturation was 90%. She does not wear home oxygen.

## 2015-02-17 NOTE — ED Notes (Signed)
Bed: AO13 Expected date:  Expected time:  Means of arrival:  Comments: EMS 79yo F, fall; rt leg rotation

## 2015-02-17 NOTE — ED Provider Notes (Signed)
CSN: [id number]     Arrival date & time 02/17/15  2320 History   First MD Initiated Contact with Patient 02/17/15 2329     Chief Complaint  Patient presents with  . Fall   HPI   79 year old female presents today status post fall. Patient reports she was walking a hardwood floor when she twisted tripping and falling landing onto her right side. She reports immediate pain to the mid shaft femur, swelling. Patient contacted EMS for transport. Patient has a history of left-sided femur fracture repaired by Dr. Charlann Boxer. Requests his surgical evaluation. Pt was given fentanyl in route via EMS. Denies any other injuries, reports this was a mechanical fall, has otherwise been feeling healthy recently.   Past Medical History  Diagnosis Date  . Arthritis   . Hyperlipidemia   . Hypertension    Past Surgical History  Procedure Laterality Date  . Tonsillectomy    . Appendectomy    . Orif femur fracture Left 09/17/2013    Procedure: OPEN REDUCTION INTERNAL FIXATION (ORIF) DISTAL FEMUR FRACTURE;  Surgeon: Shelda Pal, MD;  Location: [facility];  Service: Orthopedics;  Laterality: Left;   No family history on file. History  Substance Use Topics  . Smoking status: Never Smoker   . Smokeless tobacco: Never Used  . Alcohol Use: No   OB History    No data available     Review of Systems  All other systems reviewed and are negative.  Allergies  Review of patient's allergies indicates no known allergies.  Home Medications   Prior to Admission medications   Medication Sig Start Date End Date Taking? Authorizing Provider  amLODipine (NORVASC) 10 MG tablet Take 10 mg by mouth at bedtime. For HTN   Yes Historical Provider, MD  aspirin EC 81 MG tablet Take 81 mg by mouth at bedtime. For prophylaxis   Yes Historical Provider, MD  atorvastatin (LIPITOR) 20 MG tablet Take 20 mg by mouth at bedtime. For hyperlipidemia 08/13/13  Yes Historical Provider, MD  calcium-vitamin D (OSCAL WITH D) 500-200 MG-UNIT  per tablet Take 1 tablet by mouth daily with breakfast.   Yes Historical Provider, MD  cholecalciferol (VITAMIN D) 1000 UNITS tablet Take 1,000 Units by mouth daily.   Yes Historical Provider, MD  methocarbamol (ROBAXIN) 500 MG tablet Take 1 tablet by mouth 3 (three) times daily as needed. spasms 12/29/14  Yes Historical Provider, MD  PRESCRIPTION MEDICATION Apply 1 application topically as needed (eczema on the ear).   Yes Historical Provider, MD  alum & mag hydroxide-simeth (MAALOX/MYLANTA) 200-200-20 MG/5ML suspension Take 30 mLs by mouth every 4 (four) hours as needed for indigestion. Patient not taking: Reported on 02/18/2015 09/18/13   [name], MD  HYDROcodone-acetaminophen [facility]) 5-325 MG per tablet Take one tablet by mouth every 6 hours as needed for mild to moderate pain; Take two tablets by mouth every 6 hours as needed for moderate to severe pain Patient not taking: Reported on 02/18/2015 09/21/13   Tiffany L Reed, DO   BP 180/66 mmHg  Pulse 103  Resp 20  SpO2 96%   Physical Exam  Constitutional: She is oriented to person, place, and time. She appears well-developed and well-nourished.  HENT:  Head: Normocephalic and atraumatic.  Eyes: Conjunctivae are normal. Pupils are equal, round, and reactive to light. Right eye exhibits no discharge. Left eye exhibits no discharge. No scleral icterus.  Neck: Normal range of motion. No JVD present. No tracheal deviation present.  Pulmonary/Chest: Effort normal.  No stridor.  Musculoskeletal:  Obvious soft tissue swelling and deformity of the proximal mid right femur, no skin tenting or open wounds. Shortened and internally rotated. Pedal pulses intact, cap refill less than 3 seconds and sensation grossly intact  Neurological: She is alert and oriented to person, place, and time. Coordination normal.  Psychiatric: She has a normal mood and affect. Her behavior is normal. Judgment and thought content normal.  Nursing note and vitals  reviewed.     ED Course  Procedures (including critical care time) Labs Review Labs Reviewed  CBC WITH DIFFERENTIAL/PLATELET - Abnormal; Notable for the following:    Neutrophils Relative % 78 (*)    Neutro Abs 8.1 (*)    All other components within normal limits  COMPREHENSIVE METABOLIC PANEL - Abnormal; Notable for the following:    Glucose, Bld 127 (*)    Calcium 8.2 (*)    GFR calc non Af Amer 56 (*)    All other components within normal limits  PROTIME-INR  APTT  URINALYSIS, ROUTINE W REFLEX MICROSCOPIC (NOT AT [facility])    Imaging Review Dg Chest Portable 1 View  02/18/2015   CLINICAL DATA:  Preop.  Femoral for  EXAM: PORTABLE CHEST - 1 VIEW  COMPARISON:  09/17/2013  FINDINGS: There is a shallow inspiration. The lungs are grossly clear. There is no large effusion. There is no pneumothorax.  IMPRESSION: No acute cardiopulmonary findings.   Electronically Signed   By: Ellery Plunk M.D.   On: 02/18/2015 01:31   Dg Femur, Min 2 Views Right  02/18/2015   CLINICAL DATA:  Right leg pain after fall at home this evening, deformity.  EXAM: RIGHT FEMUR 2 VIEWS  COMPARISON:  No prior right leg imaging. Left femur radiographs reviewed.  FINDINGS: Displaced angulated transverse fracture of the proximal femoral metaphysis with apex lateral angulation and mild osseous overriding. Proximal and distal femur are intact.  IMPRESSION: Proximal femoral shaft fracture with angulation and mild overriding.   Electronically Signed   By: Rubye Oaks M.D.   On: 02/18/2015 00:57     EKG Interpretation   Date/Time:  Friday February 18 2015 01:26:31 EDT Ventricular Rate:  106 PR Interval:  166 QRS Duration: 130 QT Interval:  372 QTC Calculation: 494 R Axis:   48 Text Interpretation:  Sinus tachycardia Right bundle branch block No  significant change since last tracing 16 Sep 2013 Confirmed by KNAPP   MD-I, IVA ([id number]) on 02/18/2015 1:41:01 AM      MDM   Final diagnoses:  Femur fracture,  right, closed, initial encounter    Labs: CBC, urinalysis, CMP, PT/INR, APTT  Imaging: DG chest portable DG femur  Consults:  Therapeutics: Morphine  Discharge Meds:   Assessment/Plan: Patient presents with a right femur fracture. She was treated here in the ED with morphine, orthopedic consulted for traction. Patient sees Dr. Constance Goltz and requests his surgical evaluation. Consult to [facility] orthopedics; spoke with Dr. Charlann Boxer who instructed to have patient placed in 5-10 pounds of traction and he would evaluate patient tomorrow inpatient. No definitive surgical plan expressed.   Hospital service consulted for admission. Pt remained stable while here in the ED.          Eyvonne Mechanic, PA-C 02/18/15 9147  Rolan Bucco, MD 02/18/15 1504

## 2015-02-17 NOTE — ED Notes (Signed)
Patient transported to X-ray 

## 2015-02-18 ENCOUNTER — Inpatient Hospital Stay (HOSPITAL_COMMUNITY): Payer: Medicare Other | Admitting: Anesthesiology

## 2015-02-18 ENCOUNTER — Encounter (HOSPITAL_COMMUNITY)
Admission: EM | Disposition: A | Discharge status: Skilled Nursing Facility | Payer: Self-pay | Source: Home / Self Care | Attending: Internal Medicine

## 2015-02-18 ENCOUNTER — Emergency Department (HOSPITAL_COMMUNITY): Payer: Medicare Other

## 2015-02-18 ENCOUNTER — Inpatient Hospital Stay (HOSPITAL_COMMUNITY): Payer: Medicare Other

## 2015-02-18 ENCOUNTER — Encounter (HOSPITAL_COMMUNITY): Payer: Self-pay | Admitting: Internal Medicine

## 2015-02-18 DIAGNOSIS — I10 Essential (primary) hypertension: Secondary | ICD-10-CM

## 2015-02-18 DIAGNOSIS — Y92099 Unspecified place in other non-institutional residence as the place of occurrence of the external cause: Secondary | ICD-10-CM

## 2015-02-18 DIAGNOSIS — S72391A Other fracture of shaft of right femur, initial encounter for closed fracture: Secondary | ICD-10-CM | POA: Diagnosis not present

## 2015-02-18 DIAGNOSIS — S72001D Fracture of unspecified part of neck of right femur, subsequent encounter for closed fracture with routine healing: Secondary | ICD-10-CM | POA: Diagnosis not present

## 2015-02-18 DIAGNOSIS — S72301A Unspecified fracture of shaft of right femur, initial encounter for closed fracture: Secondary | ICD-10-CM | POA: Diagnosis not present

## 2015-02-18 DIAGNOSIS — I4510 Unspecified right bundle-branch block: Secondary | ICD-10-CM

## 2015-02-18 DIAGNOSIS — R278 Other lack of coordination: Secondary | ICD-10-CM | POA: Diagnosis not present

## 2015-02-18 DIAGNOSIS — W010XXA Fall on same level from slipping, tripping and stumbling without subsequent striking against object, initial encounter: Secondary | ICD-10-CM | POA: Diagnosis not present

## 2015-02-18 DIAGNOSIS — S7291XA Unspecified fracture of right femur, initial encounter for closed fracture: Secondary | ICD-10-CM | POA: Diagnosis not present

## 2015-02-18 DIAGNOSIS — E785 Hyperlipidemia, unspecified: Secondary | ICD-10-CM | POA: Diagnosis not present

## 2015-02-18 DIAGNOSIS — S7291XS Unspecified fracture of right femur, sequela: Secondary | ICD-10-CM | POA: Diagnosis not present

## 2015-02-18 DIAGNOSIS — Z7982 Long term (current) use of aspirin: Secondary | ICD-10-CM | POA: Diagnosis not present

## 2015-02-18 DIAGNOSIS — Z79899 Other long term (current) drug therapy: Secondary | ICD-10-CM | POA: Diagnosis not present

## 2015-02-18 DIAGNOSIS — M6281 Muscle weakness (generalized): Secondary | ICD-10-CM | POA: Diagnosis not present

## 2015-02-18 DIAGNOSIS — M21251 Flexion deformity, right hip: Secondary | ICD-10-CM | POA: Diagnosis not present

## 2015-02-18 DIAGNOSIS — Z8249 Family history of ischemic heart disease and other diseases of the circulatory system: Secondary | ICD-10-CM | POA: Diagnosis not present

## 2015-02-18 DIAGNOSIS — W19XXXA Unspecified fall, initial encounter: Secondary | ICD-10-CM

## 2015-02-18 DIAGNOSIS — Y92000 Kitchen of unspecified non-institutional (private) residence as  the place of occurrence of the external cause: Secondary | ICD-10-CM | POA: Diagnosis not present

## 2015-02-18 DIAGNOSIS — Z9181 History of falling: Secondary | ICD-10-CM | POA: Diagnosis not present

## 2015-02-18 DIAGNOSIS — S72301D Unspecified fracture of shaft of right femur, subsequent encounter for closed fracture with routine healing: Secondary | ICD-10-CM | POA: Diagnosis not present

## 2015-02-18 DIAGNOSIS — Z803 Family history of malignant neoplasm of breast: Secondary | ICD-10-CM | POA: Diagnosis not present

## 2015-02-18 DIAGNOSIS — Z4789 Encounter for other orthopedic aftercare: Secondary | ICD-10-CM | POA: Diagnosis not present

## 2015-02-18 DIAGNOSIS — M25551 Pain in right hip: Secondary | ICD-10-CM | POA: Diagnosis not present

## 2015-02-18 DIAGNOSIS — Z823 Family history of stroke: Secondary | ICD-10-CM | POA: Diagnosis not present

## 2015-02-18 DIAGNOSIS — Z01818 Encounter for other preprocedural examination: Secondary | ICD-10-CM | POA: Diagnosis not present

## 2015-02-18 DIAGNOSIS — R2681 Unsteadiness on feet: Secondary | ICD-10-CM | POA: Diagnosis not present

## 2015-02-18 DIAGNOSIS — W19XXXD Unspecified fall, subsequent encounter: Secondary | ICD-10-CM | POA: Diagnosis not present

## 2015-02-18 DIAGNOSIS — E875 Hyperkalemia: Secondary | ICD-10-CM | POA: Diagnosis not present

## 2015-02-18 HISTORY — PX: FEMUR IM NAIL: SHX1597

## 2015-02-18 LAB — CBC WITH DIFFERENTIAL/PLATELET
Basophils Absolute: 0 10*3/uL (ref 0.0–0.1)
Basophils Relative: 0 % (ref 0–1)
Eosinophils Absolute: 0.1 10*3/uL (ref 0.0–0.7)
Eosinophils Relative: 1 % (ref 0–5)
HCT: 43.9 % (ref 36.0–46.0)
Hemoglobin: 14.4 g/dL (ref 12.0–15.0)
Lymphocytes Relative: 15 % (ref 12–46)
Lymphs Abs: 1.5 10*3/uL (ref 0.7–4.0)
MCH: 29.5 pg (ref 26.0–34.0)
MCHC: 32.8 g/dL (ref 30.0–36.0)
MCV: 90 fL (ref 78.0–100.0)
Monocytes Absolute: 0.6 10*3/uL (ref 0.1–1.0)
Monocytes Relative: 6 % (ref 3–12)
Neutro Abs: 8.1 10*3/uL — ABNORMAL HIGH (ref 1.7–7.7)
Neutrophils Relative %: 78 % — ABNORMAL HIGH (ref 43–77)
Platelets: 199 10*3/uL (ref 150–400)
RBC: 4.88 MIL/uL (ref 3.87–5.11)
RDW: 13 % (ref 11.5–15.5)
WBC: 10.2 10*3/uL (ref 4.0–10.5)

## 2015-02-18 LAB — BASIC METABOLIC PANEL
Anion gap: 6 (ref 5–15)
BUN: 14 mg/dL (ref 6–20)
CALCIUM: 8 mg/dL — AB (ref 8.9–10.3)
CO2: 26 mmol/L (ref 22–32)
CREATININE: 0.93 mg/dL (ref 0.44–1.00)
Chloride: 109 mmol/L (ref 101–111)
GFR calc Af Amer: >60 mL/min (ref >60)
GFR, EST NON AFRICAN AMERICAN: 58 mL/min — AB (ref >60)
GLUCOSE: 135 mg/dL — AB (ref 65–99)
POTASSIUM: 4.3 mmol/L (ref 3.5–5.1)
Sodium: 141 mmol/L (ref 135–145)

## 2015-02-18 LAB — COMPREHENSIVE METABOLIC PANEL
ALT: 20 U/L (ref 14–54)
AST: 28 U/L (ref 15–41)
Albumin: 4 g/dL (ref 3.5–5.0)
Alkaline Phosphatase: 57 U/L (ref 38–126)
Anion gap: 8 (ref 5–15)
BILIRUBIN TOTAL: 0.7 mg/dL (ref 0.3–1.2)
BUN: 19 mg/dL (ref 6–20)
CO2: 24 mmol/L (ref 22–32)
Calcium: 8.2 mg/dL — ABNORMAL LOW (ref 8.9–10.3)
Chloride: 109 mmol/L (ref 101–111)
Creatinine, Ser: 0.96 mg/dL (ref 0.44–1.00)
GFR calc Af Amer: >60 mL/min (ref >60)
GFR, EST NON AFRICAN AMERICAN: 56 mL/min — AB (ref >60)
GLUCOSE: 127 mg/dL — AB (ref 65–99)
POTASSIUM: 4.1 mmol/L (ref 3.5–5.1)
Sodium: 141 mmol/L (ref 135–145)
Total Protein: 7.1 g/dL (ref 6.5–8.1)

## 2015-02-18 LAB — URINALYSIS, ROUTINE W REFLEX MICROSCOPIC
BILIRUBIN URINE: NEGATIVE
GLUCOSE, UA: NEGATIVE mg/dL
KETONES UR: NEGATIVE mg/dL
Nitrite: NEGATIVE
PH: 6.5 (ref 5.0–8.0)
Protein, ur: NEGATIVE mg/dL
Specific Gravity, Urine: 1.007 (ref 1.005–1.030)
Urobilinogen, UA: 0.2 mg/dL (ref 0.0–1.0)

## 2015-02-18 LAB — CBC
HCT: 43.6 % (ref 36.0–46.0)
HEMOGLOBIN: 14.1 g/dL (ref 12.0–15.0)
MCH: 29.7 pg (ref 26.0–34.0)
MCHC: 32.3 g/dL (ref 30.0–36.0)
MCV: 92 fL (ref 78.0–100.0)
Platelets: 241 10*3/uL (ref 150–400)
RBC: 4.74 MIL/uL (ref 3.87–5.11)
RDW: 13.1 % (ref 11.5–15.5)
WBC: 9.7 10*3/uL (ref 4.0–10.5)

## 2015-02-18 LAB — URINE MICROSCOPIC-ADD ON

## 2015-02-18 LAB — LIPID PANEL
CHOL/HDL RATIO: 4.8 ratio
Cholesterol: 186 mg/dL (ref 0–200)
HDL: 39 mg/dL — AB (ref >40)
LDL CALC: 123 mg/dL — AB (ref 0–99)
Triglycerides: 122 mg/dL (ref <150)
VLDL: 24 mg/dL (ref 0–40)

## 2015-02-18 LAB — SURGICAL PCR SCREEN
MRSA, PCR: NEGATIVE
STAPHYLOCOCCUS AUREUS: NEGATIVE

## 2015-02-18 LAB — APTT: aPTT: 27 seconds (ref 24–37)

## 2015-02-18 LAB — TYPE AND SCREEN
ABO/RH(D): A NEG
Antibody Screen: NEGATIVE

## 2015-02-18 LAB — PROTIME-INR
INR: 0.99 (ref 0.00–1.49)
Prothrombin Time: 13.3 seconds (ref 11.6–15.2)

## 2015-02-18 SURGERY — INSERTION, INTRAMEDULLARY ROD, FEMUR
Anesthesia: General | Site: Hip | Laterality: Right

## 2015-02-18 MED ORDER — VITAMIN D3 25 MCG (1000 UNIT) PO TABS
1000.0000 [IU] | ORAL_TABLET | Freq: Every day | ORAL | Status: DC
  Administered 2015-02-18 – 2015-02-21 (×4): 1000 [IU] via ORAL
  Filled 2015-02-18 (×4): qty 1

## 2015-02-18 MED ORDER — METOCLOPRAMIDE HCL 5 MG/ML IJ SOLN: 5.0000 mg | Freq: Three times a day (TID) | INTRAMUSCULAR | Status: DC | PRN

## 2015-02-18 MED ORDER — ACETAMINOPHEN 650 MG RE SUPP: 650.0000 mg | Freq: Four times a day (QID) | RECTAL | Status: DC | PRN

## 2015-02-18 MED ORDER — LIDOCAINE HCL (CARDIAC) 20 MG/ML IV SOLN
INTRAVENOUS | Status: AC
  Filled 2015-02-18: qty 5

## 2015-02-18 MED ORDER — NEOSTIGMINE METHYLSULFATE 10 MG/10ML IV SOLN
INTRAVENOUS | Status: AC
  Filled 2015-02-18: qty 1

## 2015-02-18 MED ORDER — ROCURONIUM BROMIDE 100 MG/10ML IV SOLN
INTRAVENOUS | Status: AC
  Filled 2015-02-18: qty 1

## 2015-02-18 MED ORDER — 0.9 % SODIUM CHLORIDE (POUR BTL) OPTIME
TOPICAL | Status: DC | PRN
  Administered 2015-02-18: 1000 mL

## 2015-02-18 MED ORDER — LIDOCAINE HCL (CARDIAC) 20 MG/ML IV SOLN
INTRAVENOUS | Status: DC | PRN
  Administered 2015-02-18: 75 mg via INTRAVENOUS
  Administered 2015-02-18: 25 mg via INTRATRACHEAL

## 2015-02-18 MED ORDER — LABETALOL HCL 5 MG/ML IV SOLN
INTRAVENOUS | Status: DC | PRN
  Administered 2015-02-18: 5 mg via INTRAVENOUS

## 2015-02-18 MED ORDER — ACETAMINOPHEN 325 MG PO TABS: 650.0000 mg | ORAL_TABLET | Freq: Four times a day (QID) | ORAL | Status: DC | PRN

## 2015-02-18 MED ORDER — OXYCODONE-ACETAMINOPHEN 5-325 MG PO TABS
1.0000 | ORAL_TABLET | ORAL | Status: DC | PRN
  Administered 2015-02-18 – 2015-02-19 (×2): 1 via ORAL
  Filled 2015-02-18 (×2): qty 1

## 2015-02-18 MED ORDER — LACTATED RINGERS IV SOLN
INTRAVENOUS | Status: DC | PRN
  Administered 2015-02-18 (×2): via INTRAVENOUS

## 2015-02-18 MED ORDER — NEOSTIGMINE METHYLSULFATE 10 MG/10ML IV SOLN
INTRAVENOUS | Status: DC | PRN
  Administered 2015-02-18: 3 mg via INTRAVENOUS

## 2015-02-18 MED ORDER — PROPOFOL 10 MG/ML IV BOLUS
INTRAVENOUS | Status: DC | PRN
  Administered 2015-02-18: 200 mg via INTRAVENOUS

## 2015-02-18 MED ORDER — ONDANSETRON HCL 4 MG/2ML IJ SOLN: 4.0000 mg | Freq: Once | INTRAMUSCULAR | Status: DC | PRN

## 2015-02-18 MED ORDER — ATORVASTATIN CALCIUM 20 MG PO TABS: 20.0000 mg | ORAL_TABLET | Freq: Every day | ORAL | Status: DC

## 2015-02-18 MED ORDER — ONDANSETRON HCL 4 MG PO TABS: 4.0000 mg | ORAL_TABLET | Freq: Four times a day (QID) | ORAL | Status: DC | PRN

## 2015-02-18 MED ORDER — AMLODIPINE BESYLATE 10 MG PO TABS
10.0000 mg | ORAL_TABLET | Freq: Every day | ORAL | Status: DC
  Administered 2015-02-18 – 2015-02-21 (×4): 10 mg via ORAL
  Filled 2015-02-18 (×4): qty 1

## 2015-02-18 MED ORDER — PHENOL 1.4 % MT LIQD: 1.0000 | OROMUCOSAL | Status: DC | PRN

## 2015-02-18 MED ORDER — MIDAZOLAM HCL 2 MG/2ML IJ SOLN
INTRAMUSCULAR | Status: AC
Start: 2015-02-18 — End: 2015-02-18
  Filled 2015-02-18: qty 2

## 2015-02-18 MED ORDER — FENTANYL CITRATE (PF) 100 MCG/2ML IJ SOLN
25.0000 ug | INTRAMUSCULAR | Status: DC | PRN
  Administered 2015-02-18 (×2): 50 ug via INTRAVENOUS

## 2015-02-18 MED ORDER — FENTANYL CITRATE (PF) 100 MCG/2ML IJ SOLN
INTRAMUSCULAR | Status: AC
  Filled 2015-02-18: qty 2

## 2015-02-18 MED ORDER — ALUM & MAG HYDROXIDE-SIMETH 200-200-20 MG/5ML PO SUSP: 30.0000 mL | ORAL | Status: DC | PRN

## 2015-02-18 MED ORDER — DOCUSATE SODIUM 100 MG PO CAPS
100.0000 mg | ORAL_CAPSULE | Freq: Two times a day (BID) | ORAL | Status: DC
  Administered 2015-02-18 – 2015-02-21 (×6): 100 mg via ORAL
  Filled 2015-02-18 (×7): qty 1

## 2015-02-18 MED ORDER — SODIUM CHLORIDE 0.9 % IV SOLN
INTRAVENOUS | Status: DC
  Administered 2015-02-18: 21:00:00 via INTRAVENOUS
  Filled 2015-02-18: qty 1000

## 2015-02-18 MED ORDER — FENTANYL CITRATE (PF) 100 MCG/2ML IJ SOLN
INTRAMUSCULAR | Status: DC | PRN
  Administered 2015-02-18: 100 ug via INTRAVENOUS

## 2015-02-18 MED ORDER — ASPIRIN EC 81 MG PO TBEC
81.0000 mg | DELAYED_RELEASE_TABLET | Freq: Every day | ORAL | Status: DC
  Filled 2015-02-18: qty 1

## 2015-02-18 MED ORDER — METHOCARBAMOL 500 MG PO TABS: 500.0000 mg | ORAL_TABLET | Freq: Four times a day (QID) | ORAL | Status: DC | PRN

## 2015-02-18 MED ORDER — HYDROMORPHONE HCL 2 MG/ML IJ SOLN
INTRAMUSCULAR | Status: AC
  Filled 2015-02-18: qty 1

## 2015-02-18 MED ORDER — GLYCOPYRROLATE 0.2 MG/ML IJ SOLN
INTRAMUSCULAR | Status: DC | PRN
  Administered 2015-02-18: 0.6 mg via INTRAVENOUS

## 2015-02-18 MED ORDER — ONDANSETRON HCL 4 MG/2ML IJ SOLN
INTRAMUSCULAR | Status: DC | PRN
  Administered 2015-02-18: 4 mg via INTRAVENOUS

## 2015-02-18 MED ORDER — CEFAZOLIN SODIUM-DEXTROSE 2-3 GM-% IV SOLR
INTRAVENOUS | Status: AC
  Filled 2015-02-18: qty 50

## 2015-02-18 MED ORDER — ALUM & MAG HYDROXIDE-SIMETH 200-200-20 MG/5ML PO SUSP: 30.0000 mL | Freq: Four times a day (QID) | ORAL | Status: DC | PRN

## 2015-02-18 MED ORDER — SODIUM CHLORIDE 0.9 % IJ SOLN
3.0000 mL | Freq: Two times a day (BID) | INTRAMUSCULAR | Status: DC
  Administered 2015-02-20 – 2015-02-21 (×2): 3 mL via INTRAVENOUS

## 2015-02-18 MED ORDER — HYDRALAZINE HCL 20 MG/ML IJ SOLN: 5.0000 mg | INTRAMUSCULAR | Status: DC | PRN

## 2015-02-18 MED ORDER — DEXAMETHASONE SODIUM PHOSPHATE 10 MG/ML IJ SOLN
INTRAMUSCULAR | Status: AC
  Filled 2015-02-18: qty 1

## 2015-02-18 MED ORDER — ACETAMINOPHEN 10 MG/ML IV SOLN
1000.0000 mg | Freq: Once | INTRAVENOUS | Status: AC
  Administered 2015-02-18: 1000 mg via INTRAVENOUS
  Filled 2015-02-18: qty 100

## 2015-02-18 MED ORDER — SUCCINYLCHOLINE CHLORIDE 20 MG/ML IJ SOLN
INTRAMUSCULAR | Status: DC | PRN
  Administered 2015-02-18: 100 mg via INTRAVENOUS

## 2015-02-18 MED ORDER — ONDANSETRON HCL 4 MG/2ML IJ SOLN
INTRAMUSCULAR | Status: AC
  Filled 2015-02-18: qty 2

## 2015-02-18 MED ORDER — ASPIRIN EC 325 MG PO TBEC
325.0000 mg | DELAYED_RELEASE_TABLET | Freq: Every day | ORAL | Status: DC
  Administered 2015-02-19 – 2015-02-21 (×3): 325 mg via ORAL
  Filled 2015-02-18 (×3): qty 1

## 2015-02-18 MED ORDER — GLYCOPYRROLATE 0.2 MG/ML IJ SOLN
INTRAMUSCULAR | Status: AC
Start: 2015-02-18 — End: 2015-02-18
  Filled 2015-02-18: qty 3

## 2015-02-18 MED ORDER — MIDAZOLAM HCL 5 MG/5ML IJ SOLN
INTRAMUSCULAR | Status: DC | PRN
  Administered 2015-02-18: 2 mg via INTRAVENOUS

## 2015-02-18 MED ORDER — METOCLOPRAMIDE HCL 10 MG PO TABS: 5.0000 mg | ORAL_TABLET | Freq: Three times a day (TID) | ORAL | Status: DC | PRN

## 2015-02-18 MED ORDER — PROPOFOL 10 MG/ML IV BOLUS
INTRAVENOUS | Status: AC
  Filled 2015-02-18: qty 20

## 2015-02-18 MED ORDER — PHENYLEPHRINE HCL 10 MG/ML IJ SOLN
INTRAMUSCULAR | Status: DC | PRN
  Administered 2015-02-18 (×2): 120 ug via INTRAVENOUS

## 2015-02-18 MED ORDER — DEXAMETHASONE SODIUM PHOSPHATE 10 MG/ML IJ SOLN
INTRAMUSCULAR | Status: DC | PRN
  Administered 2015-02-18: 10 mg via INTRAVENOUS

## 2015-02-18 MED ORDER — MENTHOL 3 MG MT LOZG
1.0000 | LOZENGE | OROMUCOSAL | Status: DC | PRN
  Administered 2015-02-18: 3 mg via ORAL
  Filled 2015-02-18: qty 9

## 2015-02-18 MED ORDER — MORPHINE SULFATE 2 MG/ML IJ SOLN
2.0000 mg | Freq: Once | INTRAMUSCULAR | Status: AC
Start: 2015-02-18 — End: 2015-02-18
  Administered 2015-02-18: 2 mg via INTRAVENOUS
  Filled 2015-02-18: qty 1

## 2015-02-18 MED ORDER — FENTANYL CITRATE (PF) 250 MCG/5ML IJ SOLN
INTRAMUSCULAR | Status: AC
  Filled 2015-02-18: qty 5

## 2015-02-18 MED ORDER — HYDROMORPHONE HCL 1 MG/ML IJ SOLN: 0.5000 mg | INTRAMUSCULAR | Status: DC | PRN

## 2015-02-18 MED ORDER — EPHEDRINE SULFATE 50 MG/ML IJ SOLN
INTRAMUSCULAR | Status: DC | PRN
  Administered 2015-02-18: 5 mg via INTRAVENOUS

## 2015-02-18 MED ORDER — SODIUM CHLORIDE 0.9 % IV SOLN
INTRAVENOUS | Status: AC
  Administered 2015-02-18: 03:00:00 via INTRAVENOUS

## 2015-02-18 MED ORDER — ATORVASTATIN CALCIUM 20 MG PO TABS
20.0000 mg | ORAL_TABLET | Freq: Every day | ORAL | Status: DC
  Administered 2015-02-18 – 2015-02-21 (×4): 20 mg via ORAL
  Filled 2015-02-18 (×4): qty 1

## 2015-02-18 MED ORDER — ROCURONIUM BROMIDE 100 MG/10ML IV SOLN
INTRAVENOUS | Status: DC | PRN
  Administered 2015-02-18: 30 mg via INTRAVENOUS

## 2015-02-18 MED ORDER — MORPHINE SULFATE 2 MG/ML IJ SOLN
2.0000 mg | Freq: Once | INTRAMUSCULAR | Status: AC
  Administered 2015-02-18: 2 mg via INTRAVENOUS
  Filled 2015-02-18: qty 1

## 2015-02-18 MED ORDER — CALCIUM CARBONATE-VITAMIN D 500-200 MG-UNIT PO TABS
1.0000 | ORAL_TABLET | Freq: Every day | ORAL | Status: DC
  Administered 2015-02-18 – 2015-02-21 (×4): 1 via ORAL
  Filled 2015-02-18 (×4): qty 1

## 2015-02-18 MED ORDER — METHOCARBAMOL 500 MG PO TABS
500.0000 mg | ORAL_TABLET | Freq: Three times a day (TID) | ORAL | Status: DC
  Administered 2015-02-18 – 2015-02-21 (×9): 500 mg via ORAL
  Filled 2015-02-18 (×11): qty 1

## 2015-02-18 MED ORDER — MAGNESIUM CITRATE PO SOLN: 0.5000 | Freq: Once | ORAL | Status: AC | PRN

## 2015-02-18 MED ORDER — ACETAMINOPHEN 10 MG/ML IV SOLN
INTRAVENOUS | Status: AC
  Filled 2015-02-18: qty 100

## 2015-02-18 MED ORDER — DEXTROSE 5 % IV SOLN
500.0000 mg | Freq: Four times a day (QID) | INTRAVENOUS | Status: DC | PRN
  Administered 2015-02-18: 500 mg via INTRAVENOUS
  Filled 2015-02-18 (×2): qty 5

## 2015-02-18 MED ORDER — ONDANSETRON HCL 4 MG/2ML IJ SOLN: 4.0000 mg | Freq: Four times a day (QID) | INTRAMUSCULAR | Status: DC | PRN

## 2015-02-18 MED ORDER — MORPHINE SULFATE 2 MG/ML IJ SOLN
2.0000 mg | INTRAMUSCULAR | Status: DC | PRN
  Administered 2015-02-18 (×2): 2 mg via INTRAVENOUS
  Filled 2015-02-18 (×2): qty 1

## 2015-02-18 MED ORDER — AMLODIPINE BESYLATE 10 MG PO TABS: 10.0000 mg | ORAL_TABLET | Freq: Every day | ORAL | Status: DC

## 2015-02-18 SURGICAL SUPPLY — 44 items
BAG SPEC THK2 15X12 ZIP CLS (MISCELLANEOUS) ×1
BAG ZIPLOCK 12X15 (MISCELLANEOUS) ×3 IMPLANT
BIT DRILL 3.8X6 NS (BIT) ×2 IMPLANT
BIT DRILL 5.3 (BIT) ×2 IMPLANT
BNDG GAUZE ELAST 4 BULKY (GAUZE/BANDAGES/DRESSINGS) ×3 IMPLANT
COVER BACK TABLE 60X90IN (DRAPES) ×2 IMPLANT
COVER PERINEAL POST (MISCELLANEOUS) ×3 IMPLANT
DRAPE INCISE IOBAN 66X45 STRL (DRAPES) ×3 IMPLANT
DRAPE LG THREE QUARTER DISP (DRAPES) ×2 IMPLANT
DRAPE STERI IOBAN 125X83 (DRAPES) ×3 IMPLANT
DRSG AQUACEL AG ADV 3.5X 4 (GAUZE/BANDAGES/DRESSINGS) ×2 IMPLANT
DRSG AQUACEL AG ADV 3.5X 6 (GAUZE/BANDAGES/DRESSINGS) ×5 IMPLANT
DRSG TEGADERM 4X4.75 (GAUZE/BANDAGES/DRESSINGS) ×2 IMPLANT
DURAPREP 26ML APPLICATOR (WOUND CARE) ×3 IMPLANT
ELECT REM PT RETURN 9FT ADLT (ELECTROSURGICAL) ×3
ELECTRODE REM PT RTRN 9FT ADLT (ELECTROSURGICAL) ×1 IMPLANT
GAUZE SPONGE 2X2 8PLY STRL LF (GAUZE/BANDAGES/DRESSINGS) IMPLANT
GAUZE SPONGE 4X4 12PLY STRL (GAUZE/BANDAGES/DRESSINGS) ×1 IMPLANT
GLOVE BIOGEL PI IND STRL 7.5 (GLOVE) ×1 IMPLANT
GLOVE BIOGEL PI IND STRL 8.5 (GLOVE) ×1 IMPLANT
GLOVE BIOGEL PI INDICATOR 7.5 (GLOVE) ×2
GLOVE BIOGEL PI INDICATOR 8.5 (GLOVE) ×2
GLOVE ECLIPSE 8.0 STRL XLNG CF (GLOVE) ×2 IMPLANT
GLOVE ORTHO TXT STRL SZ7.5 (GLOVE) ×6 IMPLANT
GLOVE SURG ORTHO 8.0 STRL STRW (GLOVE) ×3 IMPLANT
GOWN SPEC L3 XXLG W/TWL (GOWN DISPOSABLE) ×4 IMPLANT
GOWN STRL REUS W/TWL LRG LVL3 (GOWN DISPOSABLE) ×3 IMPLANT
GUIDEPIN 3.2X17.5 THRD DISP (PIN) ×2 IMPLANT
GUIDEWIRE BALL NOSE 80CM (WIRE) ×2 IMPLANT
KIT BASIN OR (CUSTOM PROCEDURE TRAY) ×3 IMPLANT
LIQUID BAND (GAUZE/BANDAGES/DRESSINGS) ×4 IMPLANT
MANIFOLD NEPTUNE II (INSTRUMENTS) ×3 IMPLANT
NAIL TROCH RH 9X36 (Nail) ×2 IMPLANT
PACK GENERAL/GYN (CUSTOM PROCEDURE TRAY) ×3 IMPLANT
POSITIONER SURGICAL ARM (MISCELLANEOUS) ×5 IMPLANT
SCREW ACE CORTICAL 6.5X70MML (Screw) ×2 IMPLANT
SCREW ACECAP 46MM (Screw) ×2 IMPLANT
SCREWDRIVER HEX TIP 3.5MM (MISCELLANEOUS) ×2 IMPLANT
SPONGE GAUZE 2X2 STER 10/PKG (GAUZE/BANDAGES/DRESSINGS) ×2
SUT MNCRL AB 4-0 PS2 18 (SUTURE) ×4 IMPLANT
SUT VIC AB 1 CT1 27 (SUTURE) ×3
SUT VIC AB 1 CT1 27XBRD ANTBC (SUTURE) ×1 IMPLANT
SUT VIC AB 2-0 CT1 27 (SUTURE) ×6
SUT VIC AB 2-0 CT1 27XBRD (SUTURE) ×2 IMPLANT

## 2015-02-18 NOTE — Anesthesia Procedure Notes (Signed)
Procedure Name: Intubation Performed by: Karie Schwalbe Pre-anesthesia Checklist: Patient identified, Emergency Drugs available, Suction available and Patient being monitored Patient Re-evaluated:Patient Re-evaluated prior to inductionOxygen Delivery Method: Circle System Utilized Preoxygenation: Pre-oxygenation with 100% oxygen Intubation Type: IV induction Ventilation: Oral airway inserted - appropriate to patient size Laryngoscope Size: Glidescope and 4 Grade View: Grade II Tube type: Oral Tube size: 7.0 mm Number of attempts: 2 Airway Equipment and Method: Stylet and Oral airway Placement Confirmation: ETT inserted through vocal cords under direct vision,  positive ETCO2 and breath sounds checked- equal and bilateral Secured at: 21 cm Tube secured with: Tape Dental Injury: Teeth and Oropharynx as per pre-operative assessment  Difficulty Due To: Difficulty was anticipated and Difficult Airway- due to anterior larynx Future Recommendations: Recommend- induction with short-acting agent, and alternative techniques readily available Comments: Able to mask ventilate the patient with an oral airway easily. Used a glidescope on first look due to notation of difficult intubation on prior anesthetic record and concerning airway exam with short TMD and obesity. CRNA obtained a grade II view with the glidescope, however, unable to pass ETT. Difficulty due to anterior larynx. Unable to get bougie to pass either. Grade IIa view obtained by Dr. Gentry Roch and with anterior pressure, ETT advanced through the cords on first pass. Recommend short acting agents and glidescope for future intubations.

## 2015-02-18 NOTE — Clinical Social Work Note (Signed)
Clinical Social Work Assessment  Patient Details  Name: Lisa White MRN: [id number] Date of Birth: 01-28-1936  Date of referral:  02/18/15               Reason for consult:  Facility Placement                Permission sought to share information with:  Oceanographer granted to share information::  Yes, Verbal Permission Granted  Name::        Agency::     Relationship::     Contact Information:     Housing/Transportation Living arrangements for the past 2 months:  Single Family Home Source of Information:  Patient, Spouse Patient Interpreter Needed:  None Criminal Activity/Legal Involvement Pertinent to Current Situation/Hospitalization:  No - Comment as needed Significant Relationships:  Spouse, Adult Children Lives with:  Spouse Do you feel safe going back to the place where you live?  No Need for family participation in patient care:  No (Coment)  Care giving concerns:  CSW received consult for SNF at discharge.    Social Worker assessment / plan:  CSW spoke with patient & husband, [name] at bedside to confirm that they are agreeable with plan for SNF at discharge - requesting [facility] SNF as she was just there in December/January 2015.   Employment status:  Retired Health and safety inspector:  Medicare PT Recommendations:  Not assessed at this time Information / Referral to community resources:  Skilled Nursing Facility  Patient/Family's Response to care:  Patient was pleased to hear that [facility] was able to offer a bed, CSW confirmed with Jasmine December at [facility] that they would be able to take her - anticipating possible discharge Monday per Dr. Butler Denmark.   Patient/Family's Understanding of and Emotional Response to Diagnosis, Current Treatment, and Prognosis:  Patient states that she slipped or missed a step after taking footie socks off and nylons on a slick ceramic/hardwood floor - which is the same way that she fractured her left femur in  December.   Emotional Assessment Appearance:  Appears stated age Attitude/Demeanor/Rapport:    Affect (typically observed):  Pleasant, Happy, Accepting Orientation:  Oriented to Self, Oriented to Place, Oriented to  Time, Oriented to Situation Alcohol / Substance use:  Never Used Psych involvement (Current and /or in the community):  No (Comment)  Discharge Needs  Concerns to be addressed:    Readmission within the last 30 days:    Current discharge risk:    Barriers to Discharge:      Arlyss Repress, LCSW 02/18/2015, 2:48 PM

## 2015-02-18 NOTE — Anesthesia Postprocedure Evaluation (Signed)
  Anesthesia Post-op Note  Patient: Lisa White  Procedure(s) Performed: Procedure(s) (LRB): INTRAMEDULLARY (IM) NAIL FEMORAL (Right)  Patient Location: PACU  Anesthesia Type: General  Level of Consciousness: awake and alert   Airway and Oxygen Therapy: Patient Spontanous Breathing  Post-op Pain: mild  Post-op Assessment: Post-op Vital signs reviewed, Patient's Cardiovascular Status Stable, Respiratory Function Stable, Patent Airway and No signs of Nausea or vomiting  Last Vitals:  Filed Vitals:   02/18/15 2023  BP:   Pulse: 60  Temp: 36.6 C  Resp: 13    Post-op Vital Signs: stable   Complications: No apparent anesthesia complications

## 2015-02-18 NOTE — Care Management Note (Signed)
Case Management Note  Patient Details  Name: Lisa White MRN: [id number] Date of Birth: 10/11/1935  Subjective/Objective:  79 y/o f admitted w/fall, r femur fx.Ortho following.From home.Likely SNF.Await PT/OT recommendations.                  Action/Plan:d/c plan SNF.   Expected Discharge Date:                 Expected Discharge Plan:  Skilled Nursing Facility  In-House Referral:  Clinical Social Work  Discharge planning Services  CM Consult  Post Acute Care Choice:    Choice offered to:     DME Arranged:    DME Agency:     HH Arranged:    HH Agency:     Status of Service:  In process, will continue to follow  Medicare Important Message Given:    Date Medicare IM Given:    Medicare IM give by:    Date Additional Medicare IM Given:    Additional Medicare Important Message give by:     If discussed at Long Length of Stay Meetings, dates discussed:    Additional Comments:  Lanier Clam, RN 02/18/2015, 3:09 PM

## 2015-02-18 NOTE — Transfer of Care (Signed)
Immediate Anesthesia Transfer of Care Note  Patient: Lisa White  Procedure(s) Performed: Procedure(s): INTRAMEDULLARY (IM) NAIL FEMORAL (Right)  Patient Location: PACU  Anesthesia Type:General  Level of Consciousness: awake, alert , oriented and patient cooperative  Airway & Oxygen Therapy: Patient Spontanous Breathing and Patient connected to face mask oxygen  Post-op Assessment: Report given to RN, Post -op Vital signs reviewed and stable and Patient moving all extremities X 4  Post vital signs: stable  Last Vitals:  Filed Vitals:   02/18/15 1945  BP:   Pulse:   Temp: 36.7 C  Resp:     Complications: No apparent anesthesia complications

## 2015-02-18 NOTE — Brief Op Note (Signed)
02/17/2015 - 02/18/2015  5:29 PM  PATIENT:  [name]  79 y.o. female  PRE-OPERATIVE DIAGNOSIS:  Right proximal femoral shaft fracture  POST-OPERATIVE DIAGNOSIS:   Right proximal femoral shaft fracture  PROCEDURE:  Procedure(s): INTRAMEDULLARY (IM) NAIL FEMORAL (Right), ORIF Right femur fracture  SURGEON:  Surgeon(s) and Role:    * Durene Romans, MD - Primary  PHYSICIAN ASSISTANT: None   ANESTHESIA:   general  EBL:  Total I/O In: -  Out: 1100 [Urine:1100]  BLOOD ADMINISTERED:none  DRAINS: none   LOCAL MEDICATIONS USED:  NONE  SPECIMEN:  No Specimen  DISPOSITION OF SPECIMEN:  N/A  COUNTS:  YES  TOURNIQUET:  * No tourniquets in log *  DICTATION: .Other Dictation: Dictation Number [id number]  PLAN OF CARE: Admit to inpatient   PATIENT DISPOSITION:  PACU - hemodynamically stable.   Delay start of Pharmacological VTE agent (>24hrs) due to surgical blood loss or risk of bleeding: no

## 2015-02-18 NOTE — Anesthesia Preprocedure Evaluation (Signed)
Anesthesia Evaluation  Patient identified by MRN, date of birth, ID band Patient awake    Reviewed: Allergy & Precautions, NPO status , Patient's Chart, lab work & pertinent test results  History of Anesthesia Complications Negative for: history of anesthetic complications  Airway Mallampati: II  TM Distance: >3 FB Neck ROM: Full    Dental no notable dental hx. (+) Dental Advisory Given   Pulmonary neg pulmonary ROS,  breath sounds clear to auscultation  Pulmonary exam normal       Cardiovascular hypertension, Pt. on medications Normal cardiovascular exam+ dysrhythmias (RBBB) Rhythm:Regular Rate:Normal     Neuro/Psych negative neurological ROS  negative psych ROS   GI/Hepatic negative GI ROS, Neg liver ROS,   Endo/Other  negative endocrine ROS  Renal/GU negative Renal ROS  negative genitourinary   Musculoskeletal  (+) Arthritis -,   Abdominal   Peds negative pediatric ROS (+)  Hematology  (+) anemia ,   Anesthesia Other Findings   Reproductive/Obstetrics negative OB ROS                             Anesthesia Physical Anesthesia Plan  ASA: II  Anesthesia Plan: General   Post-op Pain Management:    Induction: Intravenous  Airway Management Planned: Oral ETT  Additional Equipment:   Intra-op Plan:   Post-operative Plan: Extubation in OR  Informed Consent: I have reviewed the patients History and Physical, chart, labs and discussed the procedure including the risks, benefits and alternatives for the proposed anesthesia with the patient or authorized representative who has indicated his/her understanding and acceptance.   Dental advisory given  Plan Discussed with: CRNA  Anesthesia Plan Comments:         Anesthesia Quick Evaluation

## 2015-02-18 NOTE — Clinical Social Work Placement (Signed)
   CLINICAL SOCIAL WORK PLACEMENT  NOTE  Date:  02/18/2015  Patient Details  Name: Lisa White MRN: [id number] Date of Birth: May 03, 1936  Clinical Social Work is seeking post-discharge placement for this patient at the Skilled  Nursing Facility level of care (*CSW will initial, date and re-position this form in  chart as items are completed):  Yes   Patient/family provided with [place] Clinical Social Work Department's list of facilities offering this level of care within the geographic area requested by the patient (or if unable, by the patient's family).  Yes   Patient/family informed of their freedom to choose among providers that offer the needed level of care, that participate in Medicare, Medicaid or managed care program needed by the patient, have an available bed and are willing to accept the patient.  Yes   Patient/family informed of [facility]'s ownership interest in [facility] and [facility], as well as of the fact that they are under no obligation to receive care at these facilities.  PASRR submitted to EDS on 02/18/15     PASRR number received on 02/18/15     Existing PASRR number confirmed on       FL2 transmitted to all facilities in geographic area requested by pt/family on 02/18/15     FL2 transmitted to all facilities within larger geographic area on       Patient informed that his/her managed care company has contracts with or will negotiate with certain facilities, including the following:        Yes   Patient/family informed of bed offers received.  Patient chooses bed at [facility]     Physician recommends and patient chooses bed at      Patient to be transferred to [facility] on  .  Patient to be transferred to facility by       Patient family notified on   of transfer.  Name of family member notified:        PHYSICIAN       Additional Comment:    _______________________________________________ Arlyss Repress,  LCSW 02/18/2015, 2:51 PM

## 2015-02-18 NOTE — Interval H&P Note (Signed)
History and Physical Interval Note:  02/18/2015 5:18 PM  Lisa White  has presented today for surgery, with the diagnosis of PROXIMAL FEMUR FX  The various methods of treatment have been discussed with the patient and family. After consideration of risks, benefits and other options for treatment, the patient has consented to  Procedure(s): INTRAMEDULLARY (IM) NAIL FEMORAL (Right) as a surgical intervention .  The patient's history has been reviewed, patient examined, no change in status, stable for surgery.  I have reviewed the patient's chart and labs.  Questions were answered to the patient's satisfaction.     Shelda Pal

## 2015-02-18 NOTE — H&P (View-Only) (Signed)
Reason for Consult:  Right upper leg pain after fall Referring Physician: ED Physician  Lisa White is an 79 y.o. female.  HPI: Patient reports she was walking a hardwood floor when she twisted tripping, fell and landed onto her right side. She developed pain and swelling over right mid shaft femur and swelling. She does not have numbness or tingling sensation in R leg. She states that this was an accident. No dizziness, chest pain, palpitation, unilateral weakness before the event. Currently patient does not have fever, chills, chest pain, shortness of breath, cough, abdominal pain, diarrhea, symptoms of UTI. Patient has a history of left-sided femur fracture repaired by Dr. Olin.   In ED, patient was found to haveWBC 10.2, INR 0.99, PTT 27, echocardiogram, electrolytes okay. Negative chest x-ray. X-ray of R femur showed proximal femoral shaft fracture with angulation and mild overriding. Patient is admitted to inpatient for further evaluation and treatment. Orthopedic surgery was consulted to the ED.    Dr. Olin has discussed the hx, finding and procedure with the patient.  Patient has had a previous ORIF of the left femur in February of 2015  Risks, benefits and expectations were discussed with the patient.  Risks including but not limited to the risk of anesthesia, blood clots, nerve damage, blood vessel damage, failure of the prosthesis, infection and up to and including death.  Patient understand the risks, benefits and expectations and wishes to proceed with surgery.    Past Medical History  Diagnosis Date  . Arthritis   . Hyperlipidemia   . Hypertension     Past Surgical History  Procedure Laterality Date  . Tonsillectomy    . Appendectomy    . Orif femur fracture Left 09/17/2013    Procedure: OPEN REDUCTION INTERNAL FIXATION (ORIF) DISTAL FEMUR FRACTURE;  Surgeon: Aamari West D Olin, MD;  Location: [facility]S;  Service: Orthopedics;  Laterality: Left;    Family History  Problem  Relation Age of Onset  . Stroke Mother   . Stroke Father   . Breast cancer Sister   . Heart disease Mother   . Heart disease Father     Social History:  reports that she has never smoked. She has never used smokeless tobacco. She reports that she does not drink alcohol or use illicit drugs.  Allergies: No Known Allergies    Results for orders placed or performed during the hospital encounter of 02/17/15 (from the past 48 hour(s))  CBC with Differential     Status: Abnormal   Collection Time: 02/18/15 12:37 AM  Result Value Ref Range   WBC 10.2 4.0 - 10.5 K/uL   RBC 4.88 3.87 - 5.11 MIL/uL   Hemoglobin 14.4 12.0 - 15.0 g/dL   HCT 43.9 36.0 - 46.0 %   MCV 90.0 78.0 - 100.0 fL   MCH 29.5 26.0 - 34.0 pg   MCHC 32.8 30.0 - 36.0 g/dL   RDW 13.0 11.5 - 15.5 %   Platelets 199 150 - 400 K/uL   Neutrophils Relative % 78 (H) 43 - 77 %   Neutro Abs 8.1 (H) 1.7 - 7.7 K/uL   Lymphocytes Relative 15 12 - 46 %   Lymphs Abs 1.5 0.7 - 4.0 K/uL   Monocytes Relative 6 3 - 12 %   Monocytes Absolute 0.6 0.1 - 1.0 K/uL   Eosinophils Relative 1 0 - 5 %   Eosinophils Absolute 0.1 0.0 - 0.7 K/uL   Basophils Relative 0 0 - 1 %   Basophils   Absolute 0.0 0.0 - 0.1 K/uL  Comprehensive metabolic panel     Status: Abnormal   Collection Time: 02/18/15 12:37 AM  Result Value Ref Range   Sodium 141 135 - 145 mmol/L   Potassium 4.1 3.5 - 5.1 mmol/L   Chloride 109 101 - 111 mmol/L   CO2 24 22 - 32 mmol/L   Glucose, Bld 127 (H) 65 - 99 mg/dL   BUN 19 6 - 20 mg/dL   Creatinine, Ser 0.96 0.44 - 1.00 mg/dL   Calcium 8.2 (L) 8.9 - 10.3 mg/dL   Total Protein 7.1 6.5 - 8.1 g/dL   Albumin 4.0 3.5 - 5.0 g/dL   AST 28 15 - 41 U/L   ALT 20 14 - 54 U/L   Alkaline Phosphatase 57 38 - 126 U/L   Total Bilirubin 0.7 0.3 - 1.2 mg/dL   GFR calc non Af Amer 56 (L) >60 mL/min   GFR calc Af Amer >60 >60 mL/min    Comment: (NOTE) The eGFR has been calculated using the CKD EPI equation. This calculation has not been  validated in all clinical situations. eGFR's persistently <60 mL/min signify possible Chronic Kidney Disease.    Anion gap 8 5 - 15  Protime-INR     Status: None   Collection Time: 02/18/15 12:37 AM  Result Value Ref Range   Prothrombin Time 13.3 11.6 - 15.2 seconds   INR 0.99 0.00 - 1.49  APTT     Status: None   Collection Time: 02/18/15 12:37 AM  Result Value Ref Range   aPTT 27 24 - 37 seconds  Urinalysis, Routine w reflex microscopic (not at [facility])     Status: Abnormal   Collection Time: 02/18/15  2:01 AM  Result Value Ref Range   Color, Urine YELLOW YELLOW   APPearance CLOUDY (A) CLEAR   Specific Gravity, Urine 1.007 1.005 - 1.030   pH 6.5 5.0 - 8.0   Glucose, UA NEGATIVE NEGATIVE mg/dL   Hgb urine dipstick SMALL (A) NEGATIVE   Bilirubin Urine NEGATIVE NEGATIVE   Ketones, ur NEGATIVE NEGATIVE mg/dL   Protein, ur NEGATIVE NEGATIVE mg/dL   Urobilinogen, UA 0.2 0.0 - 1.0 mg/dL   Nitrite NEGATIVE NEGATIVE   Leukocytes, UA LARGE (A) NEGATIVE  Urine microscopic-add on     Status: None   Collection Time: 02/18/15  2:01 AM  Result Value Ref Range   WBC, UA 21-50 <3 WBC/hpf   RBC / HPF 0-2 <3 RBC/hpf   Bacteria, UA RARE RARE  Type and screen     Status: None   Collection Time: 02/18/15  3:31 AM  Result Value Ref Range   ABO/RH(D) A NEG    Antibody Screen NEG    Sample Expiration 02/21/2015   CBC     Status: None   Collection Time: 02/18/15  3:31 AM  Result Value Ref Range   WBC 9.7 4.0 - 10.5 K/uL   RBC 4.74 3.87 - 5.11 MIL/uL   Hemoglobin 14.1 12.0 - 15.0 g/dL   HCT 43.6 36.0 - 46.0 %   MCV 92.0 78.0 - 100.0 fL   MCH 29.7 26.0 - 34.0 pg   MCHC 32.3 30.0 - 36.0 g/dL   RDW 13.1 11.5 - 15.5 %   Platelets 241 150 - 400 K/uL  Basic metabolic panel     Status: Abnormal   Collection Time: 02/18/15  7:05 AM  Result Value Ref Range   Sodium 141 135 - 145 mmol/L   Potassium 4.3  3.5 - 5.1 mmol/L   Chloride 109 101 - 111 mmol/L   CO2 26 22 - 32 mmol/L   Glucose, Bld 135  (H) 65 - 99 mg/dL   BUN 14 6 - 20 mg/dL   Creatinine, Ser 0.93 0.44 - 1.00 mg/dL   Calcium 8.0 (L) 8.9 - 10.3 mg/dL   GFR calc non Af Amer 58 (L) >60 mL/min   GFR calc Af Amer >60 >60 mL/min    Comment: (NOTE) The eGFR has been calculated using the CKD EPI equation. This calculation has not been validated in all clinical situations. eGFR's persistently <60 mL/min signify possible Chronic Kidney Disease.    Anion gap 6 5 - 15    Dg Chest Portable 1 View  02/18/2015   CLINICAL DATA:  Preop.  Femoral for  EXAM: PORTABLE CHEST - 1 VIEW  COMPARISON:  09/17/2013  FINDINGS: There is a shallow inspiration. The lungs are grossly clear. There is no large effusion. There is no pneumothorax.  IMPRESSION: No acute cardiopulmonary findings.   Electronically Signed   By: Andreas Newport M.D.   On: 02/18/2015 01:31   Dg Femur, Min 2 Views Right  02/18/2015   CLINICAL DATA:  Right leg pain after fall at home this evening, deformity.  EXAM: RIGHT FEMUR 2 VIEWS  COMPARISON:  No prior right leg imaging. Left femur radiographs reviewed.  FINDINGS: Displaced angulated transverse fracture of the proximal femoral metaphysis with apex lateral angulation and mild osseous overriding. Proximal and distal femur are intact.  IMPRESSION: Proximal femoral shaft fracture with angulation and mild overriding.   Electronically Signed   By: Jeb Levering M.D.   On: 02/18/2015 00:57    Review of Systems  Constitutional: Negative.   HENT: Negative.   Eyes: Negative.   Respiratory: Negative.   Cardiovascular: Negative.   Gastrointestinal: Negative.   Genitourinary: Negative.   Musculoskeletal: Positive for joint pain.  Skin: Negative.   Neurological: Negative.   Endo/Heme/Allergies: Negative.   Psychiatric/Behavioral: Negative.    Blood pressure 166/80, pulse 99, temperature 98 F (36.7 C), temperature source Oral, resp. rate 20, height _0  (1.651 m), weight 74 kg (163 lb 2.3 oz), SpO2 100 %. Physical Exam    Constitutional: She appears well-developed.  HENT:  Head: Normocephalic.  Eyes: Pupils are equal, round, and reactive to light.  Neck: Neck supple. No JVD present. No tracheal deviation present. No thyromegaly present.  Cardiovascular: Normal rate, regular rhythm and intact distal pulses.   Respiratory: Effort normal and breath sounds normal.  GI: Soft. There is no tenderness. There is no guarding.  Musculoskeletal:       Right hip: She exhibits decreased range of motion, decreased strength, tenderness and bony tenderness.       Legs: Lymphadenopathy:    She has no cervical adenopathy.  Neurological: She is alert.  Skin: Skin is warm and dry.    Assessment/Plan: Right femur fracture  Plan is for Dr. Alvan Dame to bring the patient to the OR tonight.  Patient is NPO Consent obtained for ORIF of the right femur (IM nailing)    Guinevere Scarlet Lucille Passy 02/18/2015, 8:48 AM

## 2015-02-18 NOTE — Progress Notes (Signed)
Orthopedic Tech Progress Note Patient Details:  Lisa White [birth year] [id number]  Patient ID: Lisa White, female   DOB: 01-Mar-1936, 79 y.o.   MRN: [id number]   [name] 02/18/2015, 9:41 PMtrapeze bar

## 2015-02-18 NOTE — H&P (Signed)
[facility] History and Physical  Lisa White ZOX:[id number] DOB: [birth year] DOA: 02/17/2015  Referring physician: ED physician PCP: No primary care provider on file.  Specialists:   Chief Complaint: Right upper leg pain after fall  HPI: Lisa White is a 79 y.o. female with PMH of arthritis, hypertension, hyperlipidemia, who presents with right upper leg pain after fall.  Patient reports she was walking a hardwood floor when she twisted tripping, fell and landed onto her right side. She developed pain and swelling over right mid shaft femur and swelling. She does not have numbness or tingling sensation in R leg. She states that this was an accident. No dizziness, chest pain, palpitation, unilateral weakness before the event. Currently patient does not have fever, chills, chest pain, shortness of breath, cough, abdominal pain, diarrhea, symptoms of UTI. Patient has a history of left-sided femur fracture repaired by Dr. Charlann Boxer.   In ED, patient was found to haveWBC 10.2, INR 0.99, PTT 27, echocardiogram, electrolytes okay. Negative chest x-ray. X-ray of R femur showed proximal femoral shaft fracture with angulation and mild overriding. Patient is admitted to inpatient for further evaluation and treatment. Orthopedic surgery was consulted to the ED.  Where does patient live?   At home    Can patient participate in ADLs?  Yes     Review of Systems:   General: no fevers, chills, no changes in body weight, has fatigue HEENT: no blurry vision, hearing changes or sore throat Pulm: no dyspnea, coughing, wheezing CV: no chest pain, palpitations Abd: no nausea, vomiting, abdominal pain, diarrhea, constipation GU: no dysuria, burning on urination, increased urinary frequency, hematuria  Ext: no leg edema Neuro: no unilateral weakness, numbness, or tingling, no vision change or hearing loss. Has tenderness over lateral R upper leg. Right leg is internally rotated. Sensations normal.  Skin:  no rash MSK: No muscle spasm, no deformity, no limitation of range of movement in spin Heme: No easy bruising.  Travel history: No recent long distant travel.  Allergy: No Known Allergies  Past Medical History  Diagnosis Date  . Arthritis   . Hyperlipidemia   . Hypertension     Past Surgical History  Procedure Laterality Date  . Tonsillectomy    . Appendectomy    . Orif femur fracture Left 09/17/2013    Procedure: OPEN REDUCTION INTERNAL FIXATION (ORIF) DISTAL FEMUR FRACTURE;  Surgeon: Shelda Pal, MD;  Location: [facility];  Service: Orthopedics;  Laterality: Left;    Social History:  reports that she has never smoked. She has never used smokeless tobacco. She reports that she does not drink alcohol or use illicit drugs.  Family History:  Family History  Problem Relation Age of Onset  . Stroke Mother   . Stroke Father   . Breast cancer Sister   . Heart disease Mother   . Heart disease Father      Prior to Admission medications   Medication Sig Start Date End Date Taking? Authorizing Provider  amLODipine (NORVASC) 10 MG tablet Take 10 mg by mouth at bedtime. For HTN   Yes Historical Provider, MD  aspirin EC 81 MG tablet Take 81 mg by mouth at bedtime. For prophylaxis   Yes Historical Provider, MD  atorvastatin (LIPITOR) 20 MG tablet Take 20 mg by mouth at bedtime. For hyperlipidemia 08/13/13  Yes Historical Provider, MD  calcium-vitamin D (OSCAL WITH D) 500-200 MG-UNIT per tablet Take 1 tablet by mouth daily with breakfast.   Yes Historical Provider, MD  cholecalciferol (VITAMIN  D) 1000 UNITS tablet Take 1,000 Units by mouth daily.   Yes Historical Provider, MD  methocarbamol (ROBAXIN) 500 MG tablet Take 1 tablet by mouth 3 (three) times daily as needed. spasms 12/29/14  Yes Historical Provider, MD  PRESCRIPTION MEDICATION Apply 1 application topically as needed (eczema on the ear).   Yes Historical Provider, MD  alum & mag hydroxide-simeth (MAALOX/MYLANTA) 200-200-20 MG/5ML  suspension Take 30 mLs by mouth every 4 (four) hours as needed for indigestion. Patient not taking: Reported on 02/18/2015 09/18/13   [name], MD  HYDROcodone-acetaminophen [facility]) 5-325 MG per tablet Take one tablet by mouth every 6 hours as needed for mild to moderate pain; Take two tablets by mouth every 6 hours as needed for moderate to severe pain Patient not taking: Reported on 02/18/2015 09/21/13   Kermit Balo, DO    Physical Exam: Filed Vitals:   02/18/15 0008 02/18/15 0126 02/18/15 0231  BP: 141/120 180/66 166/80  Pulse: 103 103 99  Temp:   98 F (36.7 C)  TempSrc:   Oral  Resp:  20 20  Height:   5\' 5"  (1.651 m)  SpO2: 97% 96% 100%   General: Not in acute distress HEENT:       Eyes: PERRL, EOMI, no scleral icterus.       ENT: No discharge from the ears and nose, no pharynx injection, no tonsillar enlargement.        Neck: No JVD, no bruit, no mass felt. Heme: No neck lymph node enlargement. Cardiac: S1/S2, RRR, No murmurs, No gallops or rubs. Pulm: No rales, wheezing, rhonchi or rubs. Abd: Soft, nondistended, nontender, no rebound pain, no organomegaly, BS present. Ext: No pitting leg edema bilaterally. 2+DP/PT pulse bilaterally. Musculoskeletal: Has tenderness over lateral R upper leg. Right leg is internally rotated and shortened. Obvious soft tissue swelling over lateral thigh.  Sensations normal.  Skin: No rashes.  Neuro: Alert, oriented X3, cranial nerves II-XII grossly intact, muscle strength 5/5 in all extremities, sensation to light touch intact.  Psych: Patient is not psychotic, no suicidal or hemocidal ideation.  Labs on Admission:  Basic Metabolic Panel:  Recent Labs Lab 02/18/15 0037  NA 141  K 4.1  CL 109  CO2 24  GLUCOSE 127*  BUN 19  CREATININE 0.96  CALCIUM 8.2*   Liver Function Tests:  Recent Labs Lab 02/18/15 0037  AST 28  ALT 20  ALKPHOS 57  BILITOT 0.7  PROT 7.1  ALBUMIN 4.0   No results for input(s): LIPASE, AMYLASE in  the last 168 hours. No results for input(s): AMMONIA in the last 168 hours. CBC:  Recent Labs Lab 02/18/15 0037  WBC 10.2  NEUTROABS 8.1*  HGB 14.4  HCT 43.9  MCV 90.0  PLT 199   Cardiac Enzymes: No results for input(s): CKTOTAL, CKMB, CKMBINDEX, TROPONINI in the last 168 hours.  BNP (last 3 results) No results for input(s): BNP in the last 8760 hours.  ProBNP (last 3 results) No results for input(s): PROBNP in the last 8760 hours.  CBG: No results for input(s): GLUCAP in the last 168 hours.  Radiological Exams on Admission: Dg Chest Portable 1 View  02/18/2015   CLINICAL DATA:  Preop.  Femoral for  EXAM: PORTABLE CHEST - 1 VIEW  COMPARISON:  09/17/2013  FINDINGS: There is a shallow inspiration. The lungs are grossly clear. There is no large effusion. There is no pneumothorax.  IMPRESSION: No acute cardiopulmonary findings.   Electronically Signed   By: Reuel Boom  Royce Macadamia M.D.   On: 02/18/2015 01:31   Dg Femur, Min 2 Views Right  02/18/2015   CLINICAL DATA:  Right leg pain after fall at home this evening, deformity.  EXAM: RIGHT FEMUR 2 VIEWS  COMPARISON:  No prior right leg imaging. Left femur radiographs reviewed.  FINDINGS: Displaced angulated transverse fracture of the proximal femoral metaphysis with apex lateral angulation and mild osseous overriding. Proximal and distal femur are intact.  IMPRESSION: Proximal femoral shaft fracture with angulation and mild overriding.   Electronically Signed   By: Rubye Oaks M.D.   On: 02/18/2015 00:57    EKG: Independently reviewed.  Abnormal findings: QTC 494, right bundle blockade which existed in previous EKG on 09/16/13.  Assessment/Plan Principal Problem:   Femur fracture, right Active Problems:   RBBB   Fall at home   Hyperlipidemia   Hypertension   Right femur fracture: As evidenced by x-ray. Patient has moderate pain now. No neurovascular compromise. Orthopedic surgeon was consulted.  - will admit to tele bed - Pain  control: morphine prn and percocet - follow up ortho recs - NPO after MN - type and cross - INR/PTT  HLD: Last LDL was not on record -Continue home medications: Lipitor -Check FLP  HTN: Blood pressure is elevated at 180/66, likely due to pain. -IV hydralazine when necessary -Continue amlodipine  DVT ppx: SCD  Code Status: Full code Family Communication:  Yes, patient's  husband  at bed side Disposition Plan: Admit to inpatient   Date of Service 02/18/2015    Lorretta Harp [facility] Pager [phone]  If 7PM-7AM, please contact night-coverage [email or web address] Password [email or web address] 02/18/2015, 3:01 AM

## 2015-02-18 NOTE — Progress Notes (Signed)
[facility]  79 y.o female admitted this AM for right hip fracture after slipping and falling in her kitchen while she was walking back from her fridge. PMH of HTN, HLP. Had a left hip ORIF last year.  Principal Problem:   Femur fracture, right - management per ortho- will go to OR around 5 PM - pain management and DVT prophylaxis er Ortho  Active Problems:   RBBB - chronic    Hyperlipidemia - cont statin    Hypertension - cont amlodipine with holding parameters  Calvert Cantor, MD Pager: [email or web address]

## 2015-02-18 NOTE — Consult Note (Signed)
Reason for Consult:  Right upper leg pain after fall Referring Physician: ED Physician  Lisa White is an 79 y.o. female.  HPI: Patient reports she was walking a hardwood floor when she twisted tripping, fell and landed onto her right side. She developed pain and swelling over right mid shaft femur and swelling. She does not have numbness or tingling sensation in R leg. She states that this was an accident. No dizziness, chest pain, palpitation, unilateral weakness before the event. Currently patient does not have fever, chills, chest pain, shortness of breath, cough, abdominal pain, diarrhea, symptoms of UTI. Patient has a history of left-sided femur fracture repaired by Dr. Alvan Dame.   In ED, patient was found to haveWBC 10.2, INR 0.99, PTT 27, echocardiogram, electrolytes okay. Negative chest x-ray. X-ray of R femur showed proximal femoral shaft fracture with angulation and mild overriding. Patient is admitted to inpatient for further evaluation and treatment. Orthopedic surgery was consulted to the ED.    Dr. Alvan Dame has discussed the hx, finding and procedure with the patient.  Patient has had a previous ORIF of the left femur in February of 2015  Risks, benefits and expectations were discussed with the patient.  Risks including but not limited to the risk of anesthesia, blood clots, nerve damage, blood vessel damage, failure of the prosthesis, infection and up to and including death.  Patient understand the risks, benefits and expectations and wishes to proceed with surgery.    Past Medical History  Diagnosis Date  . Arthritis   . Hyperlipidemia   . Hypertension     Past Surgical History  Procedure Laterality Date  . Tonsillectomy    . Appendectomy    . Orif femur fracture Left 09/17/2013    Procedure: OPEN REDUCTION INTERNAL FIXATION (ORIF) DISTAL FEMUR FRACTURE;  Surgeon: Mauri Pole, MD;  Location: [facility];  Service: Orthopedics;  Laterality: Left;    Family History  Problem  Relation Age of Onset  . Stroke Mother   . Stroke Father   . Breast cancer Sister   . Heart disease Mother   . Heart disease Father     Social History:  reports that she has never smoked. She has never used smokeless tobacco. She reports that she does not drink alcohol or use illicit drugs.  Allergies: No Known Allergies    Results for orders placed or performed during the hospital encounter of 02/17/15 (from the past 48 hour(s))  CBC with Differential     Status: Abnormal   Collection Time: 02/18/15 12:37 AM  Result Value Ref Range   WBC 10.2 4.0 - 10.5 K/uL   RBC 4.88 3.87 - 5.11 MIL/uL   Hemoglobin 14.4 12.0 - 15.0 g/dL   HCT 43.9 36.0 - 46.0 %   MCV 90.0 78.0 - 100.0 fL   MCH 29.5 26.0 - 34.0 pg   MCHC 32.8 30.0 - 36.0 g/dL   RDW 13.0 11.5 - 15.5 %   Platelets 199 150 - 400 K/uL   Neutrophils Relative % 78 (H) 43 - 77 %   Neutro Abs 8.1 (H) 1.7 - 7.7 K/uL   Lymphocytes Relative 15 12 - 46 %   Lymphs Abs 1.5 0.7 - 4.0 K/uL   Monocytes Relative 6 3 - 12 %   Monocytes Absolute 0.6 0.1 - 1.0 K/uL   Eosinophils Relative 1 0 - 5 %   Eosinophils Absolute 0.1 0.0 - 0.7 K/uL   Basophils Relative 0 0 - 1 %   Basophils  Absolute 0.0 0.0 - 0.1 K/uL  Comprehensive metabolic panel     Status: Abnormal   Collection Time: 02/18/15 12:37 AM  Result Value Ref Range   Sodium 141 135 - 145 mmol/L   Potassium 4.1 3.5 - 5.1 mmol/L   Chloride 109 101 - 111 mmol/L   CO2 24 22 - 32 mmol/L   Glucose, Bld 127 (H) 65 - 99 mg/dL   BUN 19 6 - 20 mg/dL   Creatinine, Ser 0.96 0.44 - 1.00 mg/dL   Calcium 8.2 (L) 8.9 - 10.3 mg/dL   Total Protein 7.1 6.5 - 8.1 g/dL   Albumin 4.0 3.5 - 5.0 g/dL   AST 28 15 - 41 U/L   ALT 20 14 - 54 U/L   Alkaline Phosphatase 57 38 - 126 U/L   Total Bilirubin 0.7 0.3 - 1.2 mg/dL   GFR calc non Af Amer 56 (L) >60 mL/min   GFR calc Af Amer >60 >60 mL/min    Comment: (NOTE) The eGFR has been calculated using the CKD EPI equation. This calculation has not been  validated in all clinical situations. eGFR's persistently <60 mL/min signify possible Chronic Kidney Disease.    Anion gap 8 5 - 15  Protime-INR     Status: None   Collection Time: 02/18/15 12:37 AM  Result Value Ref Range   Prothrombin Time 13.3 11.6 - 15.2 seconds   INR 0.99 0.00 - 1.49  APTT     Status: None   Collection Time: 02/18/15 12:37 AM  Result Value Ref Range   aPTT 27 24 - 37 seconds  Urinalysis, Routine w reflex microscopic (not at [facility])     Status: Abnormal   Collection Time: 02/18/15  2:01 AM  Result Value Ref Range   Color, Urine YELLOW YELLOW   APPearance CLOUDY (A) CLEAR   Specific Gravity, Urine 1.007 1.005 - 1.030   pH 6.5 5.0 - 8.0   Glucose, UA NEGATIVE NEGATIVE mg/dL   Hgb urine dipstick SMALL (A) NEGATIVE   Bilirubin Urine NEGATIVE NEGATIVE   Ketones, ur NEGATIVE NEGATIVE mg/dL   Protein, ur NEGATIVE NEGATIVE mg/dL   Urobilinogen, UA 0.2 0.0 - 1.0 mg/dL   Nitrite NEGATIVE NEGATIVE   Leukocytes, UA LARGE (A) NEGATIVE  Urine microscopic-add on     Status: None   Collection Time: 02/18/15  2:01 AM  Result Value Ref Range   WBC, UA 21-50 <3 WBC/hpf   RBC / HPF 0-2 <3 RBC/hpf   Bacteria, UA RARE RARE  Type and screen     Status: None   Collection Time: 02/18/15  3:31 AM  Result Value Ref Range   ABO/RH(D) A NEG    Antibody Screen NEG    Sample Expiration 02/21/2015   CBC     Status: None   Collection Time: 02/18/15  3:31 AM  Result Value Ref Range   WBC 9.7 4.0 - 10.5 K/uL   RBC 4.74 3.87 - 5.11 MIL/uL   Hemoglobin 14.1 12.0 - 15.0 g/dL   HCT 43.6 36.0 - 46.0 %   MCV 92.0 78.0 - 100.0 fL   MCH 29.7 26.0 - 34.0 pg   MCHC 32.3 30.0 - 36.0 g/dL   RDW 13.1 11.5 - 15.5 %   Platelets 241 150 - 400 K/uL  Basic metabolic panel     Status: Abnormal   Collection Time: 02/18/15  7:05 AM  Result Value Ref Range   Sodium 141 135 - 145 mmol/L   Potassium 4.3  3.5 - 5.1 mmol/L   Chloride 109 101 - 111 mmol/L   CO2 26 22 - 32 mmol/L   Glucose, Bld 135  (H) 65 - 99 mg/dL   BUN 14 6 - 20 mg/dL   Creatinine, Ser 0.93 0.44 - 1.00 mg/dL   Calcium 8.0 (L) 8.9 - 10.3 mg/dL   GFR calc non Af Amer 58 (L) >60 mL/min   GFR calc Af Amer >60 >60 mL/min    Comment: (NOTE) The eGFR has been calculated using the CKD EPI equation. This calculation has not been validated in all clinical situations. eGFR's persistently <60 mL/min signify possible Chronic Kidney Disease.    Anion gap 6 5 - 15    Dg Chest Portable 1 View  02/18/2015   CLINICAL DATA:  Preop.  Femoral for  EXAM: PORTABLE CHEST - 1 VIEW  COMPARISON:  09/17/2013  FINDINGS: There is a shallow inspiration. The lungs are grossly clear. There is no large effusion. There is no pneumothorax.  IMPRESSION: No acute cardiopulmonary findings.   Electronically Signed   By: Andreas Newport M.D.   On: 02/18/2015 01:31   Dg Femur, Min 2 Views Right  02/18/2015   CLINICAL DATA:  Right leg pain after fall at home this evening, deformity.  EXAM: RIGHT FEMUR 2 VIEWS  COMPARISON:  No prior right leg imaging. Left femur radiographs reviewed.  FINDINGS: Displaced angulated transverse fracture of the proximal femoral metaphysis with apex lateral angulation and mild osseous overriding. Proximal and distal femur are intact.  IMPRESSION: Proximal femoral shaft fracture with angulation and mild overriding.   Electronically Signed   By: Jeb Levering M.D.   On: 02/18/2015 00:57    Review of Systems  Constitutional: Negative.   HENT: Negative.   Eyes: Negative.   Respiratory: Negative.   Cardiovascular: Negative.   Gastrointestinal: Negative.   Genitourinary: Negative.   Musculoskeletal: Positive for joint pain.  Skin: Negative.   Neurological: Negative.   Endo/Heme/Allergies: Negative.   Psychiatric/Behavioral: Negative.    Blood pressure 166/80, pulse 99, temperature 98 F (36.7 C), temperature source Oral, resp. rate 20, height _0  (1.651 m), weight 74 kg (163 lb 2.3 oz), SpO2 100 %. Physical Exam    Constitutional: She appears well-developed.  HENT:  Head: Normocephalic.  Eyes: Pupils are equal, round, and reactive to light.  Neck: Neck supple. No JVD present. No tracheal deviation present. No thyromegaly present.  Cardiovascular: Normal rate, regular rhythm and intact distal pulses.   Respiratory: Effort normal and breath sounds normal.  GI: Soft. There is no tenderness. There is no guarding.  Musculoskeletal:       Right hip: She exhibits decreased range of motion, decreased strength, tenderness and bony tenderness.       Legs: Lymphadenopathy:    She has no cervical adenopathy.  Neurological: She is alert.  Skin: Skin is warm and dry.    Assessment/Plan: Right femur fracture  Plan is for Dr. Alvan Dame to bring the patient to the OR tonight.  Patient is NPO Consent obtained for ORIF of the right femur (IM nailing)    Guinevere Scarlet Lucille Passy 02/18/2015, 8:48 AM

## 2015-02-18 NOTE — Progress Notes (Signed)
PT Cancellation Note  Patient Details Name: [name] MRN: [id number] DOB: [birth year]   Cancelled Treatment:    Reason Eval/Treat Not Completed: Patient not medically ready. Will await ortho consult and recommendations (activity level, WB status) before proceeding with PT eval. Thanks.    Rebeca Alert, MPT Pager: [phone]

## 2015-02-18 NOTE — Progress Notes (Signed)
OT Cancellation Note  Patient Details Name: Jenille Laszlo MRN: [id number] DOB: July 11, 1936   Cancelled Treatment:    Reason Eval/Treat Not Completed: Other (comment).  Pt not ready for OT. Will  Check back after sx.  Shaine Mount 02/18/2015, 12:39 PM  Marica Otter, OTR/L [phone] 02/18/2015

## 2015-02-18 NOTE — ED Provider Notes (Signed)
12:18 AM Pt reports right-sided LE pain from her hip to her foot with associated edema s/p fall. Pt reports she fell earlier this evening while wearing socks and transitioning to tile flooring. She denies LOC or head injury. She notes a PMhx of left femur fracture 1 year ago. Pt takes  aspirin daily but states she missed today's dose. Denies taking any other blood thinning medication.   PE: Pt is awake and alert No head trauma No respiratory distress Swelling of proximal right thigh and internal rotation of right foot. Good distal pulses, toes are normal color.  We reviewed her xrays from this visit and last year.    Medical screening examination/treatment/procedure(s) were conducted as a shared visit with non-physician practitioner(s) and myself.  I personally evaluated the patient during the encounter.   EKG Interpretation   Date/Time:  Friday February 18 2015 01:26:31 EDT Ventricular Rate:  106 PR Interval:  166 QRS Duration: 130 QT Interval:  372 QTC Calculation: 494 R Axis:   48 Text Interpretation:  Sinus tachycardia Right bundle branch block No  significant change since last tracing 16 Sep 2013 Confirmed by Trevyon Swor   MD-I, Hayzlee Mcsorley ([id number]) on 02/18/2015 1:41:01 AM       Devoria Albe, MD, Concha Pyo, MD 02/18/15 [phone]

## 2015-02-19 DIAGNOSIS — S7291XS Unspecified fracture of right femur, sequela: Secondary | ICD-10-CM

## 2015-02-19 LAB — CBC
HEMATOCRIT: 38.6 % (ref 36.0–46.0)
Hemoglobin: 12.7 g/dL (ref 12.0–15.0)
MCH: 30.1 pg (ref 26.0–34.0)
MCHC: 32.9 g/dL (ref 30.0–36.0)
MCV: 91.5 fL (ref 78.0–100.0)
Platelets: 214 10*3/uL (ref 150–400)
RBC: 4.22 MIL/uL (ref 3.87–5.11)
RDW: 13.2 % (ref 11.5–15.5)
WBC: 7.3 10*3/uL (ref 4.0–10.5)

## 2015-02-19 LAB — BASIC METABOLIC PANEL
Anion gap: 10 (ref 5–15)
Anion gap: 6 (ref 5–15)
BUN: 12 mg/dL (ref 6–20)
BUN: 20 mg/dL (ref 6–20)
CALCIUM: 8.4 mg/dL — AB (ref 8.9–10.3)
CO2: 25 mmol/L (ref 22–32)
CO2: 25 mmol/L (ref 22–32)
CREATININE: 0.96 mg/dL (ref 0.44–1.00)
Calcium: 8.3 mg/dL — ABNORMAL LOW (ref 8.9–10.3)
Chloride: 106 mmol/L (ref 101–111)
Chloride: 107 mmol/L (ref 101–111)
Creatinine, Ser: 0.91 mg/dL (ref 0.44–1.00)
GFR calc Af Amer: >60 mL/min (ref >60)
GFR calc Af Amer: >60 mL/min (ref >60)
GFR calc non Af Amer: 59 mL/min — ABNORMAL LOW (ref >60)
GFR, EST NON AFRICAN AMERICAN: 56 mL/min — AB (ref >60)
Glucose, Bld: 187 mg/dL — ABNORMAL HIGH (ref 65–99)
Glucose, Bld: 168 mg/dL — ABNORMAL HIGH (ref 65–99)
POTASSIUM: 5.4 mmol/L — AB (ref 3.5–5.1)
Potassium: 4.4 mmol/L (ref 3.5–5.1)
Sodium: 141 mmol/L (ref 135–145)
Sodium: 138 mmol/L (ref 135–145)

## 2015-02-19 LAB — GLUCOSE, CAPILLARY: Glucose-Capillary: 168 mg/dL — ABNORMAL HIGH (ref 65–99)

## 2015-02-19 NOTE — Progress Notes (Signed)
Lisa White  MRN: [id number] DOB/Age: [birth year] 79 y.o. Physician: Lynnea Maizes, M.D. 1 Day Post-Op Procedure(s) (LRB): INTRAMEDULLARY (IM) NAIL FEMORAL (Right)  Subjective: Resting comfortably, tolerating clear liquids and requesting regular diet Vital Signs Temp:  [97.4 F (36.3 C)-98.3 F (36.8 C)] 98 F (36.7 C) (07/30 0420) Pulse Rate:  [59-88] 88 (07/30 0420) Resp:  [11-20] 14 (07/30 0420) BP: (100-137)/(44-77) 100/55 mmHg (07/30 0420) SpO2:  [94 %-100 %] 95 % (07/30 0420) FiO2 (%):  [2 %-8 %] 2 % (07/29 2015)  Lab Results  Recent Labs  02/18/15 0331 02/19/15 0524  WBC 9.7 7.3  HGB 14.1 12.7  HCT 43.6 38.6  PLT 241 214   BMET  Recent Labs  02/18/15 0705 02/19/15 0524  NA 141 141  K 4.3 5.4*  CL 109 106  CO2 26 25  GLUCOSE 135* 187*  BUN 14 12  CREATININE 0.93 0.96  CALCIUM 8.0* 8.4*   INR  Date Value Ref Range Status  02/18/2015 0.99 0.00 - 1.49 Final     Exam  Right thigh soft, dressings all dry, negative homans, N/V intact distally RLE.  Plan Mobilize with PT, 50% PWB RLE, d/c foley, ASA for DVT prophylaxis  Lisa White M 02/19/2015, 8:55 AM    Contact # [phone]

## 2015-02-19 NOTE — Progress Notes (Addendum)
[facility] Progress Note   Kavina Cantave YNW:[id number] DOB: Aug 04, 1935 DOA: 02/17/2015 PCP: Gweneth Dimitri, MD  Brief narrative: Lisa White is a 79 y.o. female  for right hip fracture after slipping and falling in her kitchen while she was walking back from her fridge. PMH of HTN, HLP. Had a left hip ORIF last year.   Subjective: Pain in right hip only when she moves. No nausea, abd pain, dyspnea, cough or diarrhea.   Assessment/Plan: Femur fracture, right - management per ortho- ORIF right femur - pain management and DVT prophylaxis per Ortho - foley to be taken out today - SNF on Monday  Active Problems: Hyperkalemia - mild- will recheck   RBBB - chronic   Hyperlipidemia - cont statin   Hypertension - cont amlodipine with holding parameters    Code Status: full code Family Communication:  Disposition Plan: SNF DVT prophylaxis: aspirin  Consultants:ortho  Procedures:orif  Antibiotics: Anti-infectives    None      Objective: Filed Weights   02/18/15 0231  Weight: 74 kg (163 lb 2.3 oz)    Intake/Output Summary (Last 24 hours) at 02/19/15 1545 Last data filed at 02/19/15 1436  Gross per 24 hour  Intake 3977.08 ml  Output   1551 ml  Net 2426.08 ml     Vitals Filed Vitals:   02/19/15 0208 02/19/15 0420 02/19/15 0857 02/19/15 1441  BP: 103/47 100/55 129/58 120/45  Pulse: 83 88  95  Temp: 98 F (36.7 C) 98 F (36.7 C)  98.5 F (36.9 C)  TempSrc: Oral Oral  Oral  Resp: Height:      Weight:      SpO2: 99% 95%  97%    Exam:  General:  Pt is alert, not in acute distress  HEENT: No icterus, No thrush, oral mucosa moist  Cardiovascular: regular rate and rhythm, S1/S2 No murmur  Respiratory: clear to auscultation bilaterally   Abdomen: Soft, +Bowel sounds, non tender, non distended, no guarding  MSK: No LE  cyanosis or clubbing  Data Reviewed: Basic Metabolic Panel:  Recent Labs Lab 02/18/15 0037  02/18/15 0705 02/19/15 0524  NA 141 141 141  K 4.1 4.3 5.4*  CL 109 109 106  CO2 GLUCOSE 127* 135* 187*  BUN CREATININE 0.96 0.93 0.96  CALCIUM 8.2* 8.0* 8.4*   Liver Function Tests:  Recent Labs Lab 02/18/15 0037  AST 28  ALT 20  ALKPHOS 57  BILITOT 0.7  PROT 7.1  ALBUMIN 4.0   No results for input(s): LIPASE, AMYLASE in the last 168 hours. No results for input(s): AMMONIA in the last 168 hours. CBC:  Recent Labs Lab 02/18/15 0037 02/18/15 0331 02/19/15 0524  WBC 10.2 9.7 7.3  NEUTROABS 8.1*  --   --   HGB 14.4 14.1 12.7  HCT 43.9 43.6 38.6  MCV 90.0 92.0 91.5  PLT 199 241 214   Cardiac Enzymes: No results for input(s): CKTOTAL, CKMB, CKMBINDEX, TROPONINI in the last 168 hours. BNP (last 3 results) No results for input(s): BNP in the last 8760 hours.  ProBNP (last 3 results) No results for input(s): PROBNP in the last 8760 hours.  CBG:  Recent Labs Lab 02/19/15 0726  GLUCAP 168*    Recent Results (from the past 240 hour(s))  Surgical pcr screen     Status: None   Collection Time: 02/18/15 10:50 AM  Result Value Ref Range Status   MRSA, PCR NEGATIVE  NEGATIVE Final   Staphylococcus aureus NEGATIVE NEGATIVE Final    Comment:        The Xpert SA Assay (FDA approved for NASAL specimens in patients over 47 years of age), is one component of a comprehensive surveillance program.  Test performance has been validated by [facility] for patients greater than or equal to 34 year old. It is not intended to diagnose infection nor to guide or monitor treatment.      Studies: Dg Chest Portable 1 View  02/18/2015   CLINICAL DATA:  Preop.  Femoral for  EXAM: PORTABLE CHEST - 1 VIEW  COMPARISON:  09/17/2013  FINDINGS: There is a shallow inspiration. The lungs are grossly clear. There is no large effusion. There is no pneumothorax.  IMPRESSION: No acute cardiopulmonary findings.   Electronically Signed   By: Ellery Plunk M.D.   On:  02/18/2015 01:31   Dg C-arm 61-120 Min-no Report  02/18/2015   CLINICAL DATA: right femur fracture   C-ARM 61-120 MINUTES  Fluoroscopy was utilized by the requesting physician.  No radiographic  interpretation.    Dg Hip Operative Unilat With Pelvis Right  02/18/2015   CLINICAL DATA:  Intramedullary nail proximal femur fracture.  EXAM: OPERATIVE right HIP (WITH PELVIS IF PERFORMED) 5 VIEWS  TECHNIQUE: Fluoroscopic spot image(s) were submitted for interpretation post-operatively.  FLUOROSCOPY TIME:  2 minutes 2 seconds  COMPARISON:  02/17/2015  FINDINGS: Examination demonstrates placement of an intra medullary nail throughout the right femur with proximal and distal anchoring screws as hardware is intact and bridges patient's proximal diaphyseal fracture with anatomic alignment about the fracture site. Recommend correlation with findings at the time of the procedure.  IMPRESSION: Internal fixation of proximal femoral diaphyseal fracture with hardware intact and anatomic alignment over the fracture site.   Electronically Signed   By: Elberta Fortis M.D.   On: 02/18/2015 19:42   Dg Femur, Min 2 Views Right  02/18/2015   CLINICAL DATA:  Right leg pain after fall at home this evening, deformity.  EXAM: RIGHT FEMUR 2 VIEWS  COMPARISON:  No prior right leg imaging. Left femur radiographs reviewed.  FINDINGS: Displaced angulated transverse fracture of the proximal femoral metaphysis with apex lateral angulation and mild osseous overriding. Proximal and distal femur are intact.  IMPRESSION: Proximal femoral shaft fracture with angulation and mild overriding.   Electronically Signed   By: Rubye Oaks M.D.   On: 02/18/2015 00:57    Scheduled Meds:  Scheduled Meds: . amLODipine  10 mg Oral Daily  . aspirin EC  325 mg Oral Q breakfast  . atorvastatin  20 mg Oral Daily  . calcium-vitamin D  1 tablet Oral Q breakfast  . cholecalciferol  1,000 Units Oral Daily  . docusate sodium  100 mg Oral BID  .  methocarbamol  500 mg Oral TID  . sodium chloride  3 mL Intravenous Q12H   Continuous Infusions:   Time spent on care of this patient: 30 min   Kyasia Steuck, MD 02/19/2015, 3:45 PM  LOS: 1 day   [facility] Office  [phone] Pager - Text Page per [email or web address] If 7PM-7AM, please contact night-coverage [email or web address]

## 2015-02-19 NOTE — Evaluation (Signed)
Physical Therapy Evaluation Patient Details Name: Lisa White MRN: [id number] DOB: 1935-10-09 Today's Date: 02/19/2015   History of Present Illness  post fall  at home with femur fracture on 02/17/15, IM nail on 02/18/15, L femur fracture in 2/15.  Clinical Impression  Patient tolerated transfers to [facility] then ambulated 5 ". Minimal pain with medication. patient will benefit from PT to address problems listed in bote below.    Follow Up Recommendations SNF;Supervision/Assistance - 24 hour    Equipment Recommendations  None recommended by PT    Recommendations for Other Services       Precautions / Restrictions Precautions Precautions: Fall Restrictions Weight Bearing Restrictions: Yes RLE Weight Bearing: Partial weight bearing RLE Partial Weight Bearing Percentage or Pounds: 50      Mobility  Bed Mobility Overal bed mobility: Needs Assistance Bed Mobility: Supine to Sit     Supine to sit: Mod assist     General bed mobility comments: [place] of rails  , assist with R leg to edge of bed  Transfers Overall transfer level: Needs assistance Equipment used: Rolling walker (2 wheeled) Transfers: Sit to/from [employer] Sit to Stand: Mod assist Stand pivot transfers: Mod assist       General transfer comment: PWB on R leg, decreased ability to advance R leg, pivot to [employer] [facility].  Ambulation/Gait Ambulation/Gait assistance: Mod assist Ambulation Distance (Feet): 5 Feet Assistive device: Rolling walker (2 wheeled) Gait Pattern/deviations: Step-to pattern;Antalgic     General Gait Details: cues  for posture and PWB.  Stairs            Wheelchair Mobility    Modified Rankin (Stroke Patients Only)       Balance                                             Pertinent Vitals/Pain Pain Assessment: 0-10 Pain Score: 4  Pain Location: R thigh Pain Descriptors / Indicators: Aching    Home Living Family/patient expects to be  discharged to:: Skilled nursing facility Living Arrangements: Spouse/significant other                    Prior Function Level of Independence: Independent               Hand Dominance        Extremity/Trunk Assessment   Upper Extremity Assessment: Overall WFL for tasks assessed           Lower Extremity Assessment: RLE deficits/detail RLE Deficits / Details: requires assist  to move,     Cervical / Trunk Assessment: Normal  Communication   Communication: No difficulties  Cognition Arousal/Alertness: Awake/alert Behavior During Therapy: WFL for tasks assessed/performed Overall Cognitive Status: Within Functional Limits for tasks assessed                      General Comments      Exercises        Assessment/Plan    PT Assessment Patient needs continued PT services  PT Diagnosis Difficulty walking;Acute pain   PT Problem List Decreased strength;Decreased range of motion;Decreased activity tolerance;Decreased mobility;Pain;Decreased knowledge of use of DME;Decreased safety awareness;Decreased knowledge of precautions  PT Treatment Interventions DME instruction;Gait training;Functional mobility training;Therapeutic activities;Therapeutic exercise;Patient/family education   PT Goals (Current goals can be found in the Care Plan section) Acute Rehab PT Goals Patient  Stated Goal: to go to [facility] PT Goal Formulation: With patient Time For Goal Achievement: 03/05/15 Potential to Achieve Goals: Good    Frequency Min 3X/week   Barriers to discharge        Co-evaluation               End of Session   Activity Tolerance: Patient tolerated treatment well Patient left: in chair;with chair alarm set Nurse Communication: Mobility status         Time: 1610-9604 PT Time Calculation (min) (ACUTE ONLY): 23 min   Charges:   PT Evaluation $Initial PT Evaluation Tier I: 1 Procedure PT Treatments $Therapeutic Activity: 8-22 mins   PT G  Codes:        Rada Hay 02/19/2015, 11:41 AM Blanchard Kelch PT [phone]

## 2015-02-19 NOTE — Progress Notes (Signed)
OT Note  Patient Details Name: Lisa White MRN: [id number] DOB: 03-Sep-1935    [name]  Noted plans for SNF- will Defer OT eval to SNF. [name], [place] [phone] 02/19/2015, 11:17 AM

## 2015-02-19 NOTE — Progress Notes (Signed)
UR Completed. Davida Falconi, RN, BSN.  [phone] 

## 2015-02-19 NOTE — Op Note (Signed)
NAMEMarland White  RAYYAN, ORSBORN NO.:  [id number]  MEDICAL RECORD NO.:  [id number]  LOCATION:  1407                         FACILITY:  [facility]  PHYSICIAN:  Madlyn Frankel. Charlann Boxer, M.D.  DATE OF BIRTH:  06/16/1936  DATE OF PROCEDURE:  02/18/2015 DATE OF DISCHARGE:                              OPERATIVE REPORT   PREOPERATIVE DIAGNOSIS:  Right proximal one-third femoral shaft fracture.  POSTOPERATIVE DIAGNOSIS:  Right proximal one-third femoral shaft fracture.  PROCEDURE:  Open reduction and internal fixation, right femur fracture utilizing a Biomet VersaNail 9 mm x 360 mm with proximal and distal interlock.  SURGEON:  Madlyn Frankel. Charlann Boxer, M.D.  ASSISTANT:  Surgical team.  ANESTHESIA:  General.  BLOOD LOSS:  Probably 100 to 150 mL.  COMPLICATIONS:  None apparent.  INDICATION FOR PROCEDURE:  Ms. Lisa White is a 79 year old female, previous patient of mine with a similar almost exact fracture to the contralateral left femur about a year ago.  She unfortunately slipped and fell at her house slipping on hard wood floor and was brought to the emergency room for evaluation and radiographs confirmed fixation of the fracture.  She was admitted to the Medical Service and Orthopedics consulted for management per protocol. Risks and benefits discussed with patient as well as reviewed with her previous course including operative procedure as well as the postop course reviewed with Ms. Lisa White prior to scheduling any surgery. Consent was obtained.  PROCEDURE IN DETAIL:  The patient was brought to the operative theater. Once adequate anesthesia and preoperative antibiotics, Ancef administered and she was positioned supine on the fracture table.  The left lower extremity, the unaffected extremity was flexed and abducted out of way with bony prominence padded particularly the perineal nerve region and legs were placed in traction boots.  Once safely positioned with bony prominences padded,  fluoroscopy was used to identify the fracture pattern with traction.  At this point, I prepped and draped the right hip with shower curtain technique planning on allowing for not just the fixation but enough draping for distal interlocking screw.  A time-out was performed identifying the patient, planned procedure, and extremity.  At this point, fluoroscopy was used to identify landmarks.  An incision was made proximal to the trochanter laterally.  Sharp dissection was carried down to the tip of the trochanter and a guidewire was then inserted into the tip of the trochanter.  Due to the short nature of her leg as well as some of her adipose tissue, a little bit of challenge from the insertion site, but I got it fairly close to the tip of the trochanter.  After the proximal femur was opened and I was able to pass ball-tipped guidewire and I did use the reduction tool and passed it down to the fracture site.  I still had a difficult time manipulating the fracture pattern with and without traction.  At this point, based on the fracture pattern, difficulty in reduction, I elected to open the fracture site to facilitate reduction.  After making the incision and incising the iliotibial band and opened this up, found disrupted musculature from the fracture trauma.  Fracture patterns were readily palpable. Then using a combination of hand  and instruments as well as traction versus non-traction and rotation of lower extremity, I was able to reduce the fracture anatomically manually then I was able to pass the  ball-tipped guidewire to the knee.  At this point, I measured and selected a 36 cm nail.  With the ball-tipped guidewire placed, I began reaming with an 8-mm reamer and elected to ream up to 11 mm by 0.5 mm increments to use a 9 mm nail based on the tightness of her femoral canal.  The 9 mm nail was selected and placed by hand across the fracture site and ball-tipped guidewire was  removed.  With the nail in appropriate position and fracture reduced anatomically, I placed a proximal interlock through the greater to lesser trochanter as I had done on the contralateral hip and then using perfect circle technique distally, placed distal interlock.  At this point, final radiographs were obtained in AP and lateral planes. I then irrigated the wounds and closed the incision at the interlock site with a 2-0 Vicryl.  The proximal wounds were closed in layers with #1 Vicryl on the iliotibial band and gluteal fascia as well and then 2-0 Vicryl and running 4-0 Monocryl.  These wounds were cleaned and dried and dressed sterilely, surgical glue and Aquacel dressings.  Following completion of the case, she was carefully removed from the fracture table to the hospital bed, extubated in stable condition tolerating the procedure well.  We will allow her to be at least partial weightbearing and progress as possible through routine radiographic follow up in the office.  Plan was reviewed with family.     Madlyn Frankel Charlann Boxer, M.D.     MDO/MEDQ  D:  02/19/2015  T:  02/19/2015  Job:  [id number]

## 2015-02-20 DIAGNOSIS — W19XXXD Unspecified fall, subsequent encounter: Secondary | ICD-10-CM

## 2015-02-20 LAB — GLUCOSE, CAPILLARY: Glucose-Capillary: 99 mg/dL (ref 65–99)

## 2015-02-20 MED ORDER — ACETAMINOPHEN 325 MG PO TABS: 650.0000 mg | ORAL_TABLET | Freq: Four times a day (QID) | ORAL | Status: AC | PRN

## 2015-02-20 MED ORDER — ASPIRIN 325 MG PO TBEC: 325.0000 mg | DELAYED_RELEASE_TABLET | Freq: Every day | ORAL | Status: AC

## 2015-02-20 MED ORDER — DOCUSATE SODIUM 100 MG PO CAPS: 100.0000 mg | ORAL_CAPSULE | Freq: Two times a day (BID) | ORAL | Status: AC

## 2015-02-20 NOTE — Discharge Summary (Addendum)
Physician Discharge Summary  Lisa White ZOX:[id number] DOB: [birth year] DOA: 02/17/2015  PCP: Gweneth Dimitri, MD  Admit date: 02/17/2015 Discharge date: 02/21/2015  Time spent: 55 minutes   Recommendations for Outpatient Follow-up:  1. On full dose ASA per Ortho for DVT prophylaxis- can go back on ASA 81 mg when determined by otho  Discharge Condition: stable Diet recommendation: low sodium, heart healhty  Discharge Diagnoses:  Principal Problem:   Femur fracture, right Active Problems:   RBBB   Fall at home   Hyperlipidemia   Hypertension   History of present illness:  Lisa White is a 79 y.o. With HTN, HLP and morbid obesity admitted for right hip fracture after slipping and falling in her kitchen while she was walking back from her fridge. PMH of HTN, HLP. Had a left hip ORIF last year.  Hospital Course:  Femur fracture, right - management per ortho- ORIF right femur -  foley taken out  - ASA 325 mg daily per ortho for DVT prophylaxis  Active Problems: Hyperkalemia - mild- resolved without treatment- possibly lab error   RBBB - chronic   Hyperlipidemia - cont statin   Hypertension - cont amlodipine      Procedures:  ORIF 7/29  Consultations:  ortho  Discharge Exam: Filed Weights   02/18/15 0231  Weight: 74 kg (163 lb 2.3 oz)   Filed Vitals:   02/21/15 0535  BP: 147/52  Pulse: 92  Temp: 98.2 F (36.8 C)  Resp: 18    General: AAO x 3, no distress Cardiovascular: RRR, no murmurs  Respiratory: clear to auscultation bilaterally GI: soft, non-tender, non-distended, bowel sound positive  Discharge Instructions You were cared for by a hospitalist during your hospital stay. If you have any questions about your discharge medications or the care you received while you were in the hospital after you are discharged, you can call the unit and asked to speak with the hospitalist on call if the hospitalist that took care of you is not  available. Once you are discharged, your primary care physician will handle any further medical issues. Please note that NO REFILLS for any discharge medications will be authorized once you are discharged, as it is imperative that you return to your primary care physician (or establish a relationship with a primary care physician if you do not have one) for your aftercare needs so that they can reassess your need for medications and monitor your lab values.      Discharge Instructions    Diet - low sodium heart healthy    Complete by:  As directed      Increase activity slowly    Complete by:  As directed             Medication List    TAKE these medications        acetaminophen 325 MG tablet  Commonly known as:  TYLENOL  Take 2 tablets (650 mg total) by mouth every 6 (six) hours as needed for mild pain (or Fever >/= 101).     alum & mag hydroxide-simeth 200-200-20 MG/5ML suspension  Commonly known as:  MAALOX/MYLANTA  Take 30 mLs by mouth every 4 (four) hours as needed for indigestion.     amLODipine 10 MG tablet  Commonly known as:  NORVASC  Take 10 mg by mouth at bedtime. For HTN     aspirin 325 MG EC tablet  Take 1 tablet (325 mg total) by mouth daily with breakfast.     atorvastatin 20  MG tablet  Commonly known as:  LIPITOR  Take 20 mg by mouth at bedtime. For hyperlipidemia     calcium-vitamin D 500-200 MG-UNIT per tablet  Commonly known as:  OSCAL WITH D  Take 1 tablet by mouth daily with breakfast.     cholecalciferol 1000 UNITS tablet  Commonly known as:  VITAMIN D  Take 1,000 Units by mouth daily.     docusate sodium 100 MG capsule  Commonly known as:  COLACE  Take 1 capsule (100 mg total) by mouth 2 (two) times daily.     methocarbamol 500 MG tablet  Commonly known as:  ROBAXIN  Take 1 tablet by mouth 3 (three) times daily as needed. spasms     PRESCRIPTION MEDICATION  Apply 1 application topically as needed (eczema on the ear).       No Known  Allergies    The results of significant diagnostics from this hospitalization (including imaging, microbiology, ancillary and laboratory) are listed below for reference.    Significant Diagnostic Studies: Dg Chest Portable 1 View  02/18/2015   CLINICAL DATA:  Preop.  Femoral for  EXAM: PORTABLE CHEST - 1 VIEW  COMPARISON:  09/17/2013  FINDINGS: There is a shallow inspiration. The lungs are grossly clear. There is no large effusion. There is no pneumothorax.  IMPRESSION: No acute cardiopulmonary findings.   Electronically Signed   By: Ellery Plunk M.D.   On: 02/18/2015 01:31   Dg C-arm 61-120 Min-no Report  02/18/2015   CLINICAL DATA: right femur fracture   C-ARM 61-120 MINUTES  Fluoroscopy was utilized by the requesting physician.  No radiographic  interpretation.    Dg Hip Operative Unilat With Pelvis Right  02/18/2015   CLINICAL DATA:  Intramedullary nail proximal femur fracture.  EXAM: OPERATIVE right HIP (WITH PELVIS IF PERFORMED) 5 VIEWS  TECHNIQUE: Fluoroscopic spot image(s) were submitted for interpretation post-operatively.  FLUOROSCOPY TIME:  2 minutes 2 seconds  COMPARISON:  02/17/2015  FINDINGS: Examination demonstrates placement of an intra medullary nail throughout the right femur with proximal and distal anchoring screws as hardware is intact and bridges patient's proximal diaphyseal fracture with anatomic alignment about the fracture site. Recommend correlation with findings at the time of the procedure.  IMPRESSION: Internal fixation of proximal femoral diaphyseal fracture with hardware intact and anatomic alignment over the fracture site.   Electronically Signed   By: Elberta Fortis M.D.   On: 02/18/2015 19:42   Dg Femur, Min 2 Views Right  02/18/2015   CLINICAL DATA:  Right leg pain after fall at home this evening, deformity.  EXAM: RIGHT FEMUR 2 VIEWS  COMPARISON:  No prior right leg imaging. Left femur radiographs reviewed.  FINDINGS: Displaced angulated transverse fracture  of the proximal femoral metaphysis with apex lateral angulation and mild osseous overriding. Proximal and distal femur are intact.  IMPRESSION: Proximal femoral shaft fracture with angulation and mild overriding.   Electronically Signed   By: Rubye Oaks M.D.   On: 02/18/2015 00:57    Microbiology: Recent Results (from the past 240 hour(s))  Surgical pcr screen     Status: None   Collection Time: 02/18/15 10:50 AM  Result Value Ref Range Status   MRSA, PCR NEGATIVE NEGATIVE Final   Staphylococcus aureus NEGATIVE NEGATIVE Final    Comment:        The Xpert SA Assay (FDA approved for NASAL specimens in patients over 33 years of age), is one component of a comprehensive surveillance program.  Test performance  has been validated by [facility] for patients greater than or equal to 33 year old. It is not intended to diagnose infection nor to guide or monitor treatment.      Labs: Basic Metabolic Panel:  Recent Labs Lab 02/18/15 0037 02/18/15 0705 02/19/15 0524 02/19/15 1610  NA 141 141 141 138  K 4.1 4.3 5.4* 4.4  CL 109 109 106 107  CO2 24 26 25 25   GLUCOSE 127* 135* 187* 168*  BUN 19 14 12 20   CREATININE 0.96 0.93 0.96 0.91  CALCIUM 8.2* 8.0* 8.4* 8.3*   Liver Function Tests:  Recent Labs Lab 02/18/15 0037  AST 28  ALT 20  ALKPHOS 57  BILITOT 0.7  PROT 7.1  ALBUMIN 4.0   No results for input(s): LIPASE, AMYLASE in the last 168 hours. No results for input(s): AMMONIA in the last 168 hours. CBC:  Recent Labs Lab 02/18/15 0037 02/18/15 0331 02/19/15 0524  WBC 10.2 9.7 7.3  NEUTROABS 8.1*  --   --   HGB 14.4 14.1 12.7  HCT 43.9 43.6 38.6  MCV 90.0 92.0 91.5  PLT 199 241 214   Cardiac Enzymes: No results for input(s): CKTOTAL, CKMB, CKMBINDEX, TROPONINI in the last 168 hours. BNP: BNP (last 3 results) No results for input(s): BNP in the last 8760 hours.  ProBNP (last 3 results) No results for input(s): PROBNP in the last 8760  hours.  CBG:  Recent Labs Lab 02/19/15 0726 02/20/15 0746 02/21/15 0718  GLUCAP 168* 99 110*       SignedCalvert Cantor, MD [facility] 02/21/2015, 7:41 AM

## 2015-02-20 NOTE — Progress Notes (Signed)
Physical Therapy Treatment Patient Details Name: Lisa White MRN: [id number] DOB: April 13, 1936 Today's Date: 02/20/2015    History of Present Illness post fall  at home with femur fracture on 02/17/15, IM nail on 02/18/15, L femur fracture in 2/15.    PT Comments    POD # 2 R IM nail after falling over her cat at home.  Pt already OOB in recliner.  Educated on 50% WBing.  Assisted with sit to stand and amb in hallway.  Pt required increased time and 75% VC's on proper use of walker and proper tech to keep 50% WBing.  Returned to room then performed R LE TE's mostly AAROM.  Positioned to comfort then applied ICE.    Follow Up Recommendations  SNF     Equipment Recommendations  None recommended by PT    Recommendations for Other Services       Precautions / Restrictions Precautions Precautions: Fall Restrictions Weight Bearing Restrictions: Yes RLE Weight Bearing: Partial weight bearing RLE Partial Weight Bearing Percentage or Pounds: 50%    Mobility  Bed Mobility               General bed mobility comments: Pt OOB in recliner  Transfers Overall transfer level: Needs assistance Equipment used: Rolling walker (2 wheeled) Transfers: Sit to/from Stand Sit to Stand: Min assist         General transfer comment: 50% VC's on proper tech and hand placement as well as 50% WBing R LE  Ambulation/Gait Ambulation/Gait assistance: Min assist;Mod assist Ambulation Distance (Feet): 18 Feet Assistive device: Rolling walker (2 wheeled) Gait Pattern/deviations: Step-to pattern;Decreased stance time - right Gait velocity: decreased    General Gait Details: 75% VC's on proper sequencing and education on 50% WBing.  Second assist for safety following with recliner.    Stairs            Wheelchair Mobility    Modified Rankin (Stroke Patients Only)       Balance                                    Cognition Arousal/Alertness: Awake/alert Behavior  During Therapy: WFL for tasks assessed/performed Overall Cognitive Status: Within Functional Limits for tasks assessed                      Exercises   20 reps B AP 10 reps B knee presses 10 reps R LE HS AAROM 10 reps R LE ABD/ADD AAROM 10 reps R LE SLR AAROM Followed by ICE   General Comments        Pertinent Vitals/Pain Pain Assessment: 0-10 Pain Score: 6  Pain Location: R thigh  Pain Descriptors / Indicators: Aching;Constant;Sore;Tightness Pain Intervention(s): Monitored during session;Repositioned;Ice applied    Home Living                      Prior Function            PT Goals (current goals can now be found in the care plan section) Progress towards PT goals: Progressing toward goals    Frequency  Min 3X/week    PT Plan      Co-evaluation             End of Session Equipment Utilized During Treatment: Gait belt Activity Tolerance: Patient tolerated treatment well Patient left: in chair;with chair alarm set  Time: 1050-1130 PT Time Calculation (min) (ACUTE ONLY): 40 min  Charges:  $Gait Training: 8-22 mins $Therapeutic Exercise: 8-22 mins $Therapeutic Activity: 8-22 mins                    G Codes:      Felecia Shelling  PTA [facility]  Acute  Rehab Pager      [phone]

## 2015-02-20 NOTE — Progress Notes (Signed)
Patient ID: Lisa White, female   DOB: Jan 08, 1936, 79 y.o.   MRN: [id number]    Subjective: 2 Days Post-Op Procedure(s) (LRB): INTRAMEDULLARY (IM) NAIL FEMORAL (Right) Patient reports pain as 2 on 0-10 scale.  Pain increases when moving RLE Denies CP or SOB.  Voiding without difficulty. Positive flatus. Objective: Vital signs in last 24 hours: Temp:  [98 F (36.7 C)-98.6 F (37 C)] 98 F (36.7 C) (07/31 0511) Pulse Rate:  [95-97] 96 (07/31 0511) Resp:  [14-20] 14 (07/31 0511) BP: (107-125)/(45-87) 125/87 mmHg (07/31 0511) SpO2:  [93 %-97 %] 93 % (07/31 0511)  Intake/Output from previous day: 07/30 0701 - 07/31 0700 In: 1080 [P.O.:1080] Out: 1650 [Urine:1650] Intake/Output this shift:    Labs:  Recent Labs  02/18/15 0037 02/18/15 0331 02/19/15 0524  HGB 14.4 14.1 12.7    Recent Labs  02/18/15 0331 02/19/15 0524  WBC 9.7 7.3  RBC 4.74 4.22  HCT 43.6 38.6  PLT 241 214    Recent Labs  02/19/15 0524 02/19/15 1610  NA 141 138  K 5.4* 4.4  CL 106 107  CO2 25 25  BUN 12 20  CREATININE 0.96 0.91  GLUCOSE 187* 168*  CALCIUM 8.4* 8.3*    Recent Labs  02/18/15 0037  INR 0.99    Physical Exam: Neurologically intact ABD soft Sensation intact distally Intact pulses distally Incision: dressing C/D/I and no drainage Compartment soft  Assessment/Plan: 2 Days Post-Op Procedure(s) (LRB): INTRAMEDULLARY (IM) NAIL FEMORAL (Right) Advance diet Up with therapy  Pt going to [facility] [place] for Dr. Venita Lick [facility] [phone] 02/20/2015, 9:00 AM

## 2015-02-21 ENCOUNTER — Encounter (HOSPITAL_COMMUNITY): Payer: Self-pay | Admitting: Orthopedic Surgery

## 2015-02-21 DIAGNOSIS — R2681 Unsteadiness on feet: Secondary | ICD-10-CM | POA: Diagnosis not present

## 2015-02-21 DIAGNOSIS — R278 Other lack of coordination: Secondary | ICD-10-CM | POA: Diagnosis not present

## 2015-02-21 DIAGNOSIS — K5900 Constipation, unspecified: Secondary | ICD-10-CM | POA: Diagnosis not present

## 2015-02-21 DIAGNOSIS — E559 Vitamin D deficiency, unspecified: Secondary | ICD-10-CM | POA: Diagnosis not present

## 2015-02-21 DIAGNOSIS — S7291XS Unspecified fracture of right femur, sequela: Secondary | ICD-10-CM | POA: Diagnosis not present

## 2015-02-21 DIAGNOSIS — M21251 Flexion deformity, right hip: Secondary | ICD-10-CM | POA: Diagnosis not present

## 2015-02-21 DIAGNOSIS — K5901 Slow transit constipation: Secondary | ICD-10-CM | POA: Diagnosis not present

## 2015-02-21 DIAGNOSIS — Z9181 History of falling: Secondary | ICD-10-CM | POA: Diagnosis not present

## 2015-02-21 DIAGNOSIS — I10 Essential (primary) hypertension: Secondary | ICD-10-CM | POA: Diagnosis not present

## 2015-02-21 DIAGNOSIS — Z4789 Encounter for other orthopedic aftercare: Secondary | ICD-10-CM | POA: Diagnosis not present

## 2015-02-21 DIAGNOSIS — D62 Acute posthemorrhagic anemia: Secondary | ICD-10-CM | POA: Diagnosis not present

## 2015-02-21 DIAGNOSIS — D72829 Elevated white blood cell count, unspecified: Secondary | ICD-10-CM | POA: Diagnosis not present

## 2015-02-21 DIAGNOSIS — S72001D Fracture of unspecified part of neck of right femur, subsequent encounter for closed fracture with routine healing: Secondary | ICD-10-CM | POA: Diagnosis not present

## 2015-02-21 DIAGNOSIS — M6281 Muscle weakness (generalized): Secondary | ICD-10-CM | POA: Diagnosis not present

## 2015-02-21 DIAGNOSIS — I4510 Unspecified right bundle-branch block: Secondary | ICD-10-CM | POA: Diagnosis not present

## 2015-02-21 DIAGNOSIS — E785 Hyperlipidemia, unspecified: Secondary | ICD-10-CM | POA: Diagnosis not present

## 2015-02-21 LAB — GLUCOSE, CAPILLARY: Glucose-Capillary: 110 mg/dL — ABNORMAL HIGH (ref 65–99)

## 2015-02-21 MED ORDER — OXYCODONE-ACETAMINOPHEN 5-325 MG PO TABS: 1.0000 | ORAL_TABLET | ORAL | Status: AC | PRN

## 2015-02-21 NOTE — Progress Notes (Signed)
[facility]  Paitent evaluated today. She is stable for discharge to [facility]. She has been ambulating, voiding and had a BM today. Not using any narcotics over the past 24 hrs.  See my updated d/c summary from 02/20/15.    Calvert Cantor, MD

## 2015-02-21 NOTE — Progress Notes (Signed)
[facility] called for transport.     Williemae Muriel, LCSW [facility] Clinical Social Worker cell #: [phone]  

## 2015-02-21 NOTE — Discharge Instructions (Signed)
Maintain surgical dressing at hip and thigh until follow up May shower as needed keep wounds dry   Partial weight bearing right lower extremity until further directed from our office

## 2015-02-21 NOTE — Clinical Social Work Placement (Signed)
Patient is set to discharge to [facility] today. Patient & husband, [name] aware. Discharge packet given to RN, Victorino Dike will be called for transport once admission paperwork is completed.     Lincoln Maxin, LCSW [facility] Clinical Social Worker cell #: [phone]    CLINICAL SOCIAL WORK PLACEMENT  NOTE  Date:  02/21/2015  Patient Details  Name: Lisa White MRN: [id number] Date of Birth: [birth year]  Clinical Social Work is seeking post-discharge placement for this patient at the Skilled  Nursing Facility level of care (*CSW will initial, date and re-position this form in  chart as items are completed):  Yes   Patient/family provided with [facility] Clinical Social Work Department's list of facilities offering this level of care within the geographic area requested by the patient (or if unable, by the patient's family).  Yes   Patient/family informed of their freedom to choose among providers that offer the needed level of care, that participate in Medicare, Medicaid or managed care program needed by the patient, have an available bed and are willing to accept the patient.  Yes   Patient/family informed of [facility] ownership interest in [facility] and [facility], as well as of the fact that they are under no obligation to receive care at these facilities.  PASRR submitted to EDS on 02/18/15     PASRR number received on 02/18/15     Existing PASRR number confirmed on       FL2 transmitted to all facilities in geographic area requested by pt/family on 02/18/15     FL2 transmitted to all facilities within larger geographic area on       Patient informed that his/her managed care company has contracts with or will negotiate with certain facilities, including the following:        Yes   Patient/family informed of bed offers received.  Patient chooses bed at [facility]     Physician recommends and patient chooses bed at       Patient to be transferred to [facility] on 02/21/15.  Patient to be transferred to facility by [facility]     Patient family notified on 02/21/15 of transfer.  Name of family member notified:  patient's husband, [name] via phone     PHYSICIAN       Additional Comment:    _______________________________________________ Arlyss Repress, LCSW 02/21/2015, 10:50 AM

## 2015-02-21 NOTE — Care Management Important Message (Signed)
Important Message  Patient Details  Name: Lisa White MRN: [id number] Date of Birth: 12-31-1935   Medicare Important Message Given:  Yes-second notification given    Renie Ora 02/21/2015, 2:38 PMImportant Message  Patient Details  Name: Lisa White MRN: [id number] Date of Birth: Jun 08, 1936   Medicare Important Message Given:  Yes-second notification given    Renie Ora 02/21/2015, 2:38 PM

## 2015-02-21 NOTE — Progress Notes (Signed)
Patient ID: Lisa White, female   DOB: January 20, 1936, 79 y.o.   MRN: [id number] Subjective: 3 Days Post-Op Procedure(s) (LRB): INTRAMEDULLARY (IM) NAIL FEMORAL (Right)    Patient reports pain as mild.  Been up with therapy, pleased with progress. Going to [facility] todaty  Objective:   VITALS:   Filed Vitals:   02/21/15 0535  BP: 147/52  Pulse: 92  Temp: 98.2 F (36.8 C)  Resp: 18    Neurovascular intact Incision: dressing C/D/I  LABS  Recent Labs  02/19/15 0524  HGB 12.7  HCT 38.6  WBC 7.3  PLT 214     Recent Labs  02/19/15 0524 02/19/15 1610  NA 141 138  K 5.4* 4.4  BUN 12 20  CREATININE 0.96 0.91  GLUCOSE 187* 168*    No results for input(s): LABPT, INR in the last 72 hours.   Assessment/Plan: 3 Days Post-Op Procedure(s) (LRB): INTRAMEDULLARY (IM) NAIL FEMORAL (Right)   Advance diet Up with therapy Discharge to SNF today  RTC tin 2 weeks for wound check and X-rays PWB until follow up

## 2015-02-21 NOTE — Care Management Note (Signed)
Case Management Note  Patient Details  Name: Lisa White MRN: [id number] Date of Birth: Jun 27, 1936  Subjective/Objective:  79 y/o f admitted w/R hip fx.                   Action/Plan:d/c plan SNF.   Expected Discharge Date:                Expected Discharge Plan:  Skilled Nursing Facility  In-House Referral:  Clinical Social Work  Discharge planning Services  CM Consult  Post Acute Care Choice:    Choice offered to:     DME Arranged:    DME Agency:     HH Arranged:    HH Agency:     Status of Service:  Completed, signed off  Medicare Important Message Given:    Date Medicare IM Given:    Medicare IM give by:    Date Additional Medicare IM Given:    Additional Medicare Important Message give by:     If discussed at Long Length of Stay Meetings, dates discussed:    Additional Comments:  Lanier Clam, RN 02/21/2015, 10:19 AM

## 2015-02-22 ENCOUNTER — Encounter: Payer: Self-pay | Admitting: Adult Health

## 2015-02-22 ENCOUNTER — Non-Acute Institutional Stay (SKILLED_NURSING_FACILITY): Payer: Medicare Other | Admitting: Adult Health

## 2015-02-22 DIAGNOSIS — E785 Hyperlipidemia, unspecified: Secondary | ICD-10-CM | POA: Diagnosis not present

## 2015-02-22 DIAGNOSIS — I4510 Unspecified right bundle-branch block: Secondary | ICD-10-CM | POA: Diagnosis not present

## 2015-02-22 DIAGNOSIS — S7291XS Unspecified fracture of right femur, sequela: Secondary | ICD-10-CM

## 2015-02-22 DIAGNOSIS — K5900 Constipation, unspecified: Secondary | ICD-10-CM

## 2015-02-22 DIAGNOSIS — E559 Vitamin D deficiency, unspecified: Secondary | ICD-10-CM

## 2015-02-22 DIAGNOSIS — I10 Essential (primary) hypertension: Secondary | ICD-10-CM

## 2015-02-24 NOTE — Progress Notes (Signed)
Patient ID: Lisa White, female   DOB: 09-26-1935, 79 y.o.   MRN: [id number]    DATE: 02/22/15 MRN:  [id number]  BIRTHDAY: [birth year]  Facility:  Nursing Home Location:  [facility]  Nursing Home Room Number: 501-2  LEVEL OF CARE:  SNF (31)  Contact Information    Name Relation Home Work Mobile   Fletchall,Raymond Spouse [phone]     Judene Companion [phone]     No name specified           Chief Complaint  Patient presents with  . Hospitalization Follow-up    right femur fracture S/P ORIF, RBBB, hyperlipidemia, hypertension, constipation and vitamin D deficiency    HISTORY OF PRESENT ILLNESS:  This is a 79 year old female who had a fall at home and sustained a right femur fracture for which she had ORIF. She has PMH of hypertension, hyperlipidemia and left hip ORIF last year.  She has been admitted for a short-term rehabilitation.  PAST MEDICAL HISTORY:  Past Medical History  Diagnosis Date  . Arthritis   . Hyperlipidemia   . Hypertension      CURRENT MEDICATIONS: Reviewed  Patient's Medications  New Prescriptions   No medications on file  Previous Medications   ACETAMINOPHEN (TYLENOL) 325 MG TABLET    Take 2 tablets (650 mg total) by mouth every 6 (six) hours as needed for mild pain (or Fever >/= 101).   ALUM & MAG HYDROXIDE-SIMETH (MAALOX/MYLANTA) 200-200-20 MG/5ML SUSPENSION    Take 30 mLs by mouth every 4 (four) hours as needed for indigestion.   AMLODIPINE (NORVASC) 10 MG TABLET    Take 10 mg by mouth at bedtime. For HTN   ASPIRIN EC 325 MG EC TABLET    Take 1 tablet (325 mg total) by mouth daily with breakfast.   ATORVASTATIN (LIPITOR) 20 MG TABLET    Take 20 mg by mouth at bedtime. For hyperlipidemia   CALCIUM-VITAMIN D (OSCAL WITH D) 500-200 MG-UNIT PER TABLET    Take 1 tablet by mouth daily with breakfast.   CHOLECALCIFEROL (VITAMIN D) 1000 UNITS TABLET    Take 1,000 Units by mouth daily.   DOCUSATE SODIUM (COLACE) 100 MG  CAPSULE    Take 1 capsule (100 mg total) by mouth 2 (two) times daily.   METHOCARBAMOL (ROBAXIN) 500 MG TABLET    Take 1 tablet by mouth 3 (three) times daily as needed. spasms   OXYCODONE-ACETAMINOPHEN (PERCOCET/ROXICET) 5-325 MG PER TABLET    Take 1-2 tablets by mouth every 4 (four) hours as needed for moderate pain.   PRESCRIPTION MEDICATION    Apply 1 application topically as needed (eczema on the ear).  Modified Medications   No medications on file  Discontinued Medications   No medications on file     No Known Allergies   REVIEW OF SYSTEMS:  GENERAL: no change in appetite, no fatigue, no weight changes, no fever, chills or weakness SKIN: Denies rash, itching, wounds, ulcer sores, or nail abnormality EYES: Denies change in vision, dry eyes, eye pain, itching or discharge EARS: Denies change in hearing, ringing in ears, or earache NOSE: Denies nasal congestion or epistaxis MOUTH and THROAT: Denies oral discomfort, gingival pain or bleeding, pain from teeth or hoarseness   RESPIRATORY: no cough, SOB, DOE, wheezing, hemoptysis CARDIAC: no chest pain, edema or palpitations GI: no abdominal pain, diarrhea, constipation, heart burn, nausea or vomiting GU: Denies dysuria, frequency, hematuria, incontinence, or discharge PSYCHIATRIC: Denies feeling of depression or anxiety. No report  of hallucinations, insomnia, paranoia, or agitation  PHYSICAL EXAMINATION  GENERAL APPEARANCE: Well nourished. In no acute distress.  SKIN:  Right hip surgical incision is covered with aquacel dressing, dry, no erythema HEAD: Normal in size and contour. No evidence of trauma EYES: Lids open and close normally. No blepharitis, entropion or ectropion. PERRL. Conjunctivae are clear and sclerae are white. Lenses are without opacity EARS: Pinnae are normal. Patient hears normal voice tunes of the examiner MOUTH and THROAT: Lips are without lesions. Oral mucosa is moist and without lesions. Tongue is normal in  shape, size, and color and without lesions NECK: supple, trachea midline, no neck masses, no thyroid tenderness, no thyromegaly LYMPHATICS: no LAN in the neck, no supraclavicular LAN RESPIRATORY: breathing is even & unlabored, BS CTAB CARDIAC: RRR, no murmur,no extra heart sounds, no edema EXTREMITIES:  Able to move 4 extremities PSYCHIATRIC: Alert and oriented X 3. Affect and behavior are appropriate  LABS/RADIOLOGY: Labs reviewed: Basic Metabolic Panel:  Recent Labs  57/84/69 0705 02/19/15 0524 02/19/15 1610  NA 141 141 138  K 4.3 5.4* 4.4  CL 109 106 107  CO2 26 25 25   GLUCOSE 135* 187* 168*  BUN 14 12 20   CREATININE 0.93 0.96 0.91  CALCIUM 8.0* 8.4* 8.3*   Liver Function Tests:  Recent Labs  02/18/15 0037  AST 28  ALT 20  ALKPHOS 57  BILITOT 0.7  PROT 7.1  ALBUMIN 4.0   CBC:  Recent Labs  02/18/15 0037 02/18/15 0331 02/19/15 0524  WBC 10.2 9.7 7.3  NEUTROABS 8.1*  --   --   HGB 14.4 14.1 12.7  HCT 43.9 43.6 38.6  MCV 90.0 92.0 91.5  PLT 199 241 214   Lipid Panel:  Recent Labs  02/18/15 0705  HDL 39*   CBG:  Recent Labs  02/19/15 0726 02/20/15 0746 02/21/15 0718  GLUCAP 168* 99 110*    Dg Chest Portable 1 View  02/18/2015   CLINICAL DATA:  Preop.  Femoral for  EXAM: PORTABLE CHEST - 1 VIEW  COMPARISON:  09/17/2013  FINDINGS: There is a shallow inspiration. The lungs are grossly clear. There is no large effusion. There is no pneumothorax.  IMPRESSION: No acute cardiopulmonary findings.   Electronically Signed   By: Ellery Plunk M.D.   On: 02/18/2015 01:31   Dg C-arm 61-120 Min-no Report  02/18/2015   CLINICAL DATA: right femur fracture   C-ARM 61-120 MINUTES  Fluoroscopy was utilized by the requesting physician.  No radiographic  interpretation.    Dg Hip Operative Unilat With Pelvis Right  02/18/2015   CLINICAL DATA:  Intramedullary nail proximal femur fracture.  EXAM: OPERATIVE right HIP (WITH PELVIS IF PERFORMED) 5 VIEWS   TECHNIQUE: Fluoroscopic spot image(s) were submitted for interpretation post-operatively.  FLUOROSCOPY TIME:  2 minutes 2 seconds  COMPARISON:  02/17/2015  FINDINGS: Examination demonstrates placement of an intra medullary nail throughout the right femur with proximal and distal anchoring screws as hardware is intact and bridges patient's proximal diaphyseal fracture with anatomic alignment about the fracture site. Recommend correlation with findings at the time of the procedure.  IMPRESSION: Internal fixation of proximal femoral diaphyseal fracture with hardware intact and anatomic alignment over the fracture site.   Electronically Signed   By: Elberta Fortis M.D.   On: 02/18/2015 19:42   Dg Femur, Min 2 Views Right  02/18/2015   CLINICAL DATA:  Right leg pain after fall at home this evening, deformity.  EXAM: RIGHT FEMUR 2 VIEWS  COMPARISON:  No prior right leg imaging. Left femur radiographs reviewed.  FINDINGS: Displaced angulated transverse fracture of the proximal femoral metaphysis with apex lateral angulation and mild osseous overriding. Proximal and distal femur are intact.  IMPRESSION: Proximal femoral shaft fracture with angulation and mild overriding.   Electronically Signed   By: Rubye Oaks M.D.   On: 02/18/2015 00:57    ASSESSMENT/PLAN:  Right femur fracture S/P ORIF - for rehabilitation; continue aspirin 325 mg 1 tab by mouth daily for DVT prophylaxis; Percocet 5/325 mg 1-2 tabs by mouth every 4 hours when necessary for pain; and Robaxin 500 mg 1 tab by mouth 3 times a day when necessary for muscle spasm;check CBC and BMP  RBBB - chronic  Hyperlipidemia - continue atorvastatin 20 mg 1 tab by mouth daily at bedtime  Hypertension - continue Norvasc 10 mg 1 tab by mouth daily at bedtime  Constipation - continue Colace 100 mg 1 capsule by mouth twice a day  Vitamin D deficiency - continue vitamin D 1000 units 1 by mouth daily    Goals of care:  Short-term rehabilitation        [facility], NP [facility] [phone]

## 2015-02-25 ENCOUNTER — Non-Acute Institutional Stay (SKILLED_NURSING_FACILITY): Payer: Medicare Other | Admitting: Internal Medicine

## 2015-02-25 DIAGNOSIS — S7291XS Unspecified fracture of right femur, sequela: Secondary | ICD-10-CM

## 2015-02-25 DIAGNOSIS — D62 Acute posthemorrhagic anemia: Secondary | ICD-10-CM | POA: Diagnosis not present

## 2015-02-25 DIAGNOSIS — K5901 Slow transit constipation: Secondary | ICD-10-CM

## 2015-02-25 DIAGNOSIS — E785 Hyperlipidemia, unspecified: Secondary | ICD-10-CM | POA: Diagnosis not present

## 2015-02-25 DIAGNOSIS — I4510 Unspecified right bundle-branch block: Secondary | ICD-10-CM | POA: Diagnosis not present

## 2015-02-25 DIAGNOSIS — I10 Essential (primary) hypertension: Secondary | ICD-10-CM | POA: Diagnosis not present

## 2015-02-25 DIAGNOSIS — D72829 Elevated white blood cell count, unspecified: Secondary | ICD-10-CM

## 2015-02-25 NOTE — Progress Notes (Signed)
Patient ID: Lisa White, female   DOB: [birth year], 79 y.o.   MRN: [id number]      [facility]   PCP: [facility], [place]  Code Status: FULL CODE  No Known Allergies  Chief Complaint  Patient presents with  . New Admit To SNF     HPI:  79 y.o. patient is here for short term rehabilitation post hospital admission from 02/17/15-02/21/15 post mechanical fall with right femur fracture. She underwent ORIF. She has PMH of HTN, HLD. She is seen in her room today. Her pain is under control with current regimen. She has some discomfort at back of her throat post extubation and losenges help. She had a bowel movement this am. Denies any concerns. She had chest xray and u/a with c/s sent yesterday for elevated wbc  Review of Systems:  Constitutional: Negative for fever, chills, diaphoresis.  HENT: Negative for headache, congestion, nasal discharge Eyes: Negative for eye pain, blurred vision, double vision and discharge.  Respiratory: Negative for cough, shortness of breath and wheezing.   Cardiovascular: Negative for chest pain, palpitations, leg swelling.  Gastrointestinal: Negative for heartburn, nausea, vomiting, abdominal pain. Appetite is good Genitourinary: Negative for dysuria and flank pain.  Musculoskeletal: Negative for back pain, falls in facility Skin: Negative for itching, rash.  Neurological: Negative for dizziness, tingling, focal weakness Psychiatric/Behavioral: Negative for depression   Past Medical History  Diagnosis Date  . Arthritis   . Hyperlipidemia   . Hypertension    Past Surgical History  Procedure Laterality Date  . Tonsillectomy    . Appendectomy    . Orif femur fracture Left 09/17/2013    Procedure: OPEN REDUCTION INTERNAL FIXATION (ORIF) DISTAL FEMUR FRACTURE;  Surgeon: Shelda Pal, MD;  Location: [facility];  Service: Orthopedics;  Laterality: Left;  . Femur im nail Right 02/18/2015    Procedure: INTRAMEDULLARY (IM) NAIL  FEMORAL;  Surgeon: Durene Romans, MD;  Location: [facility];  Service: Orthopedics;  Laterality: Right;   Social History:   reports that she has never smoked. She has never used smokeless tobacco. She reports that she does not drink alcohol or use illicit drugs.  Family History  Problem Relation Age of Onset  . Stroke Mother   . Stroke Father   . Breast cancer Sister   . Heart disease Mother   . Heart disease Father     Medications:   Medication List       This list is accurate as of: 02/25/15  1:33 PM.  Always use your most recent med list.               acetaminophen 325 MG tablet  Commonly known as:  TYLENOL  Take 2 tablets (650 mg total) by mouth every 6 (six) hours as needed for mild pain (or Fever >/= 101).     alum & mag hydroxide-simeth 200-200-20 MG/5ML suspension  Commonly known as:  MAALOX/MYLANTA  Take 30 mLs by mouth every 4 (four) hours as needed for indigestion.     amLODipine 10 MG tablet  Commonly known as:  NORVASC  Take 10 mg by mouth at bedtime. For HTN     aspirin 325 MG EC tablet  Take 1 tablet (325 mg total) by mouth daily with breakfast.     atorvastatin 20 MG tablet  Commonly known as:  LIPITOR  Take 20 mg by mouth at bedtime. For hyperlipidemia     calcium-vitamin D 500-200 MG-UNIT per tablet  Commonly known as:  OSCAL WITH D  Take 1 tablet by mouth daily with breakfast.     cholecalciferol 1000 UNITS tablet  Commonly known as:  VITAMIN D  Take 1,000 Units by mouth daily.     docusate sodium 100 MG capsule  Commonly known as:  COLACE  Take 1 capsule (100 mg total) by mouth 2 (two) times daily.     methocarbamol 500 MG tablet  Commonly known as:  ROBAXIN  Take 1 tablet by mouth 3 (three) times daily as needed. spasms     oxyCODONE-acetaminophen 5-325 MG per tablet  Commonly known as:  PERCOCET/ROXICET  Take 1-2 tablets by mouth every 4 (four) hours as needed for moderate pain.     PRESCRIPTION MEDICATION  Apply 1 application topically  as needed (eczema on the ear).         Physical Exam: Filed Vitals:   02/25/15 1329  BP: 115/54  Pulse: 84  Temp: 96.9 F (36.1 C)  Resp: 18  Height:  (1.651 m)  Weight: 163 lb (73.936 kg)  SpO2: 93%    General- elderly female, well built, in no acute distress Head- normocephalic, atraumatic Throat- moist mucus membrane, normal oropharynx Eyes- PERRLA, EOMI, no pallor, no icterus, no discharge, normal conjunctiva, normal sclera Neck- no cervical lymphadenopathy Cardiovascular- normal s1,s2, no murmurs, palpable dorsalis pedis and radial pulses, 1+ right leg edema Respiratory- bilateral clear to auscultation, no wheeze, no rhonchi, no crackles, no use of accessory muscles Abdomen- bowel sounds present, soft, non tender Musculoskeletal- able to move all 4 extremities, using walker, ROM limited with right hip  Neurological- no focal deficit, alert and oriented to person, place and time Skin- warm and dry, aquacel dressing to right hip incision area, resolving bruise Psychiatry- normal mood and affect    Labs reviewed: Basic Metabolic Panel:  Recent Labs  21/30/86 0705 02/19/15 0524 02/19/15 1610  NA 141 141 138  K 4.3 5.4* 4.4  CL 109 106 107  CO2 GLUCOSE 135* 187* 168*  BUN CREATININE 0.93 0.96 0.91  CALCIUM 8.0* 8.4* 8.3*   Liver Function Tests:  Recent Labs  02/18/15 0037  AST 28  ALT 20  ALKPHOS 57  BILITOT 0.7  PROT 7.1  ALBUMIN 4.0   No results for input(s): LIPASE, AMYLASE in the last 8760 hours. No results for input(s): AMMONIA in the last 8760 hours. CBC:  Recent Labs  02/18/15 0037 02/18/15 0331 02/19/15 0524  WBC 10.2 9.7 7.3  NEUTROABS 8.1*  --   --   HGB 14.4 14.1 12.7  HCT 43.9 43.6 38.6  MCV 90.0 92.0 91.5  PLT 199 241 214   02/23/15 wbc 11.2, hb 11.7, hct 35.6, glu 109, na 143, k 5.1, bun 23, cr 0.87, ca 8.7   Radiological Exams: Dg Chest Portable 1 View  02/18/2015   CLINICAL DATA:  Preop.   Femoral for  EXAM: PORTABLE CHEST - 1 VIEW  COMPARISON:  09/17/2013  FINDINGS: There is a shallow inspiration. The lungs are grossly clear. There is no large effusion. There is no pneumothorax.  IMPRESSION: No acute cardiopulmonary findings.   Electronically Signed   By: Ellery Plunk M.D.   On: 02/18/2015 01:31   Dg C-arm 61-120 Min-no Report  02/18/2015   CLINICAL DATA: right femur fracture   C-ARM 61-120 MINUTES  Fluoroscopy was utilized by the requesting physician.  No radiographic  interpretation.    Dg Hip Operative Unilat With Pelvis Right  02/18/2015   CLINICAL DATA:  Intramedullary nail proximal femur fracture.  EXAM: OPERATIVE right HIP (WITH PELVIS IF PERFORMED) 5 VIEWS  TECHNIQUE: Fluoroscopic spot image(s) were submitted for interpretation post-operatively.  FLUOROSCOPY TIME:  2 minutes 2 seconds  COMPARISON:  02/17/2015  FINDINGS: Examination demonstrates placement of an intra medullary nail throughout the right femur with proximal and distal anchoring screws as hardware is intact and bridges patient's proximal diaphyseal fracture with anatomic alignment about the fracture site. Recommend correlation with findings at the time of the procedure.  IMPRESSION: Internal fixation of proximal femoral diaphyseal fracture with hardware intact and anatomic alignment over the fracture site.   Electronically Signed   By: Elberta Fortis M.D.   On: 02/18/2015 19:42   Dg Femur, Min 2 Views Right  02/18/2015   CLINICAL DATA:  Right leg pain after fall at home this evening, deformity.  EXAM: RIGHT FEMUR 2 VIEWS  COMPARISON:  No prior right leg imaging. Left femur radiographs reviewed.  FINDINGS: Displaced angulated transverse fracture of the proximal femoral metaphysis with apex lateral angulation and mild osseous overriding. Proximal and distal femur are intact.  IMPRESSION: Proximal femoral shaft fracture with angulation and mild overriding.   Electronically Signed   By: Rubye Oaks M.D.   On:  02/18/2015 00:57    Assessment/Plan  Right femur fracture  Post fall. S/P ORIF. Has f/u with dr Charlann Boxer. WBAT. Continue percocet 5/325 1-2 tab q4h prn pain and robaxin 500 mg q8h prn muscle spasm. Continue aspirin 325 mg daily for dvt prophylaxis. Will have patient work with PT/OT as tolerated to regain strength and restore function.  Fall precautions are in place. Continue vitamin d supplement  Leukocytosis Wbc 11.2.  Lab from hospital at discharge stable. No signs of infection on exam. cxr shows no acute abnormality. Monitor clinically for now. Pending urine culture report. Likely reactive leukocytosis. Check cbc with diff in 1 week  Blood loss anemia Likely post op, no signs of bleed, dressing clean and dry, monitor h&h  HTN Stable bp, infact DBP on lower side. Monitor bp daily for now. Continue norvasc 10 mg daily  HLD Continue atorvastatin 20 mg daily  RBBB Stable and chronic  Constipation  Stable, continue Colace 100 mg bid. Hydration encouraged   Goals of care: short term rehabilitation   Labs/tests ordered: cbc with diff in 1 week  Family/ staff Communication: reviewed care plan with patient and nursing supervisor    Oneal Grout, MD  [facility] [phone] (Monday-Friday 8 am - 5 pm) [phone] (afterhours)

## 2015-03-01 ENCOUNTER — Encounter: Payer: Self-pay | Admitting: Adult Health

## 2015-03-01 ENCOUNTER — Non-Acute Institutional Stay (SKILLED_NURSING_FACILITY): Payer: Medicare Other | Admitting: Adult Health

## 2015-03-01 DIAGNOSIS — K5901 Slow transit constipation: Secondary | ICD-10-CM | POA: Diagnosis not present

## 2015-03-01 DIAGNOSIS — E559 Vitamin D deficiency, unspecified: Secondary | ICD-10-CM

## 2015-03-01 DIAGNOSIS — E785 Hyperlipidemia, unspecified: Secondary | ICD-10-CM | POA: Diagnosis not present

## 2015-03-01 DIAGNOSIS — I10 Essential (primary) hypertension: Secondary | ICD-10-CM | POA: Diagnosis not present

## 2015-03-01 DIAGNOSIS — I4510 Unspecified right bundle-branch block: Secondary | ICD-10-CM | POA: Diagnosis not present

## 2015-03-01 DIAGNOSIS — S7291XS Unspecified fracture of right femur, sequela: Secondary | ICD-10-CM

## 2015-03-01 DIAGNOSIS — D72829 Elevated white blood cell count, unspecified: Secondary | ICD-10-CM | POA: Diagnosis not present

## 2015-03-01 NOTE — Progress Notes (Signed)
Patient ID: Lisa White, female   DOB: 1935/07/28, 79 y.o.   MRN: [id number]    DATE: 03/01/15 MRN:  [id number]  BIRTHDAY: 29-Nov-1935  Facility:  Nursing Home Location:  [facility]  Nursing Home Room Number: 501-2  LEVEL OF CARE:  SNF (31)  Contact Information    Name Relation Home Work Mobile   Gehl,Raymond Spouse [phone]     Judene Companion [phone]     No name specified           Chief Complaint  Patient presents with  . Discharge Note    Right femur fracture S/P ORIF, RBBB, hyperlipidemia, hypertension, constipation, leukocytosis and vitamin D deficiency    HISTORY OF PRESENT ILLNESS:  This is a 79 year old female who is for discharge home and will have outpatient rehabilitation. DME: Rolling walker. She had a fall at home and sustained a right femur fracture for which she had ORIF. She has PMH of hypertension, hyperlipidemia and left hip ORIF last year.  Patient was admitted to this facility for short-term rehabilitation after the patient's recent hospitalization.  Patient has completed SNF rehabilitation and therapy has cleared the patient for discharge.  PAST MEDICAL HISTORY:  Past Medical History  Diagnosis Date  . Arthritis   . Hyperlipidemia   . Hypertension      CURRENT MEDICATIONS: Reviewed  Patient's Medications  New Prescriptions   No medications on file  Previous Medications   ACETAMINOPHEN (TYLENOL) 325 MG TABLET    Take 2 tablets (650 mg total) by mouth every 6 (six) hours as needed for mild pain (or Fever >/= 101).   ALUM & MAG HYDROXIDE-SIMETH (MAALOX/MYLANTA) 200-200-20 MG/5ML SUSPENSION    Take 30 mLs by mouth every 4 (four) hours as needed for indigestion.   AMLODIPINE (NORVASC) 10 MG TABLET    Take 10 mg by mouth at bedtime. For HTN   ASPIRIN EC 325 MG EC TABLET    Take 1 tablet (325 mg total) by mouth daily with breakfast.   ATORVASTATIN (LIPITOR) 20 MG TABLET    Take 20 mg by mouth at bedtime. For  hyperlipidemia   CALCIUM-VITAMIN D (OSCAL WITH D) 500-200 MG-UNIT PER TABLET    Take 1 tablet by mouth daily with breakfast.   CHOLECALCIFEROL (VITAMIN D) 1000 UNITS TABLET    Take 1,000 Units by mouth daily.   DOCUSATE SODIUM (COLACE) 100 MG CAPSULE    Take 1 capsule (100 mg total) by mouth 2 (two) times daily.   METHOCARBAMOL (ROBAXIN) 500 MG TABLET    Take 1 tablet by mouth 3 (three) times daily as needed. spasms   OXYCODONE-ACETAMINOPHEN (PERCOCET/ROXICET) 5-325 MG PER TABLET    Take 1-2 tablets by mouth every 4 (four) hours as needed for moderate pain.   PRESCRIPTION MEDICATION    Apply 1 application topically as needed (eczema on the ear).  Modified Medications   No medications on file  Discontinued Medications   No medications on file     No Known Allergies   REVIEW OF SYSTEMS:  GENERAL: no change in appetite, no fatigue, no weight changes, no fever, chills or weakness EYES: Denies change in vision, dry eyes, eye pain, itching or discharge EARS: Denies change in hearing, ringing in ears, or earache NOSE: Denies nasal congestion or epistaxis MOUTH and THROAT: Denies oral discomfort, gingival pain or bleeding, pain from teeth or hoarseness   RESPIRATORY: no cough, SOB, DOE, wheezing, hemoptysis CARDIAC: no chest pain, edema or palpitations GI: no abdominal pain,  diarrhea, constipation, heart burn, nausea or vomiting GU: Denies dysuria, frequency, hematuria, incontinence, or discharge PSYCHIATRIC: Denies feeling of depression or anxiety. No report of hallucinations, insomnia, paranoia, or agitation  PHYSICAL EXAMINATION  GENERAL APPEARANCE: Well nourished. In no acute distress.  SKIN:  Right hip surgical incision is covered with aquacel dressing, dry, no erythema HEAD: Normal in size and contour. No evidence of trauma EYES: Lids open and close normally. No blepharitis, entropion or ectropion. PERRL. Conjunctivae are clear and sclerae are white. Lenses are without opacity EARS:  Pinnae are normal. Patient hears normal voice tunes of the examiner MOUTH and THROAT: Lips are without lesions. Oral mucosa is moist and without lesions. Tongue is normal in shape, size, and color and without lesions NECK: supple, trachea midline, no neck masses, no thyroid tenderness, no thyromegaly LYMPHATICS: no LAN in the neck, no supraclavicular LAN RESPIRATORY: breathing is even & unlabored, BS CTAB CARDIAC: RRR, no murmur,no extra heart sounds, no edema EXTREMITIES:  Able to move 4 extremities PSYCHIATRIC: Alert and oriented X 3. Affect and behavior are appropriate  LABS/RADIOLOGY: Labs reviewed: 02/25/15  Chest x-ray shows no acute cardiopulmonary findings; chronic chest findings are present 02/23/15  Wbc 11.2  hgb 11.7  hct 35.6  Platelet 307  NA 143  K 5.1  Glucose 109  BUN 23  Creatinine 0.87  Calcium 8.7 Basic Metabolic Panel:  Recent Labs  16/10/96 0705 02/19/15 0524 02/19/15 1610  NA 141 141 138  K 4.3 5.4* 4.4  CL 109 106 107  CO2 26 25 25   GLUCOSE 135* 187* 168*  BUN 14 12 20   CREATININE 0.93 0.96 0.91  CALCIUM 8.0* 8.4* 8.3*   Liver Function Tests:  Recent Labs  02/18/15 0037  AST 28  ALT 20  ALKPHOS 57  BILITOT 0.7  PROT 7.1  ALBUMIN 4.0   CBC:  Recent Labs  02/18/15 0037 02/18/15 0331 02/19/15 0524  WBC 10.2 9.7 7.3  NEUTROABS 8.1*  --   --   HGB 14.4 14.1 12.7  HCT 43.9 43.6 38.6  MCV 90.0 92.0 91.5  PLT 199 241 214   Lipid Panel:  Recent Labs  02/18/15 0705  HDL 39*   CBG:  Recent Labs  02/19/15 0726 02/20/15 0746 02/21/15 0718  GLUCAP 168* 99 110*    Dg Chest Portable 1 View  02/18/2015   CLINICAL DATA:  Preop.  Femoral for  EXAM: PORTABLE CHEST - 1 VIEW  COMPARISON:  09/17/2013  FINDINGS: There is a shallow inspiration. The lungs are grossly clear. There is no large effusion. There is no pneumothorax.  IMPRESSION: No acute cardiopulmonary findings.   Electronically Signed   By: Ellery Plunk M.D.   On: 02/18/2015 01:31    Dg C-arm 61-120 Min-no Report  02/18/2015   CLINICAL DATA: right femur fracture   C-ARM 61-120 MINUTES  Fluoroscopy was utilized by the requesting physician.  No radiographic  interpretation.    Dg Hip Operative Unilat With Pelvis Right  02/18/2015   CLINICAL DATA:  Intramedullary nail proximal femur fracture.  EXAM: OPERATIVE right HIP (WITH PELVIS IF PERFORMED) 5 VIEWS  TECHNIQUE: Fluoroscopic spot image(s) were submitted for interpretation post-operatively.  FLUOROSCOPY TIME:  2 minutes 2 seconds  COMPARISON:  02/17/2015  FINDINGS: Examination demonstrates placement of an intra medullary nail throughout the right femur with proximal and distal anchoring screws as hardware is intact and bridges patient's proximal diaphyseal fracture with anatomic alignment about the fracture site. Recommend correlation with findings at the time  of the procedure.  IMPRESSION: Internal fixation of proximal femoral diaphyseal fracture with hardware intact and anatomic alignment over the fracture site.   Electronically Signed   By: Elberta Fortis M.D.   On: 02/18/2015 19:42   Dg Femur, Min 2 Views Right  02/18/2015   CLINICAL DATA:  Right leg pain after fall at home this evening, deformity.  EXAM: RIGHT FEMUR 2 VIEWS  COMPARISON:  No prior right leg imaging. Left femur radiographs reviewed.  FINDINGS: Displaced angulated transverse fracture of the proximal femoral metaphysis with apex lateral angulation and mild osseous overriding. Proximal and distal femur are intact.  IMPRESSION: Proximal femoral shaft fracture with angulation and mild overriding.   Electronically Signed   By: Rubye Oaks M.D.   On: 02/18/2015 00:57    ASSESSMENT/PLAN:  Right femur fracture S/P ORIF - for outpatient rehabilitation; continue aspirin 325 mg 1 tab by mouth daily for DVT prophylaxis; Percocet 5/325 mg 1-2 tabs by mouth every 4 hours when necessary for pain; and Robaxin 500 mg 1 tab by mouth 3 times a day when necessary for muscle  spasm  RBBB - chronic  Hyperlipidemia - continue atorvastatin 20 mg 1 tab by mouth daily at bedtime  Hypertension - continue Norvasc 10 mg 1 tab by mouth daily at bedtime  Constipation - continue Colace 100 mg 1 capsule by mouth twice a day  Vitamin D deficiency - continue vitamin D 1000 units 1 by mouth daily  Leukocytosis - wbc 11.2; no signs of infection on exam; likely reactive leukocytosis      I have filled out patient's discharge paperwork and written prescriptions.  Patient will have outpatient rehabilitation.  DME provided:  Rolling walker  Total discharge time: Greater than 30 minutes  Discharge time involved coordination of the discharge process with Child psychotherapist, nursing staff and therapy department. Medical justification for DME verified.    [facility], NP [employer] [phone]

## 2015-03-04 DIAGNOSIS — Z4789 Encounter for other orthopedic aftercare: Secondary | ICD-10-CM | POA: Diagnosis not present

## 2015-03-04 DIAGNOSIS — S72391D Other fracture of shaft of right femur, subsequent encounter for closed fracture with routine healing: Secondary | ICD-10-CM | POA: Diagnosis not present

## 2015-03-08 DIAGNOSIS — S72391D Other fracture of shaft of right femur, subsequent encounter for closed fracture with routine healing: Secondary | ICD-10-CM | POA: Diagnosis not present

## 2015-03-10 DIAGNOSIS — S72391D Other fracture of shaft of right femur, subsequent encounter for closed fracture with routine healing: Secondary | ICD-10-CM | POA: Diagnosis not present

## 2015-03-14 DIAGNOSIS — S72391D Other fracture of shaft of right femur, subsequent encounter for closed fracture with routine healing: Secondary | ICD-10-CM | POA: Diagnosis not present

## 2015-03-17 DIAGNOSIS — S72391D Other fracture of shaft of right femur, subsequent encounter for closed fracture with routine healing: Secondary | ICD-10-CM | POA: Diagnosis not present

## 2015-03-21 DIAGNOSIS — S72391D Other fracture of shaft of right femur, subsequent encounter for closed fracture with routine healing: Secondary | ICD-10-CM | POA: Diagnosis not present

## 2015-03-24 DIAGNOSIS — S72391D Other fracture of shaft of right femur, subsequent encounter for closed fracture with routine healing: Secondary | ICD-10-CM | POA: Diagnosis not present

## 2015-03-29 DIAGNOSIS — S72391D Other fracture of shaft of right femur, subsequent encounter for closed fracture with routine healing: Secondary | ICD-10-CM | POA: Diagnosis not present

## 2015-03-30 ENCOUNTER — Other Ambulatory Visit: Payer: Self-pay | Admitting: Adult Health

## 2015-04-01 DIAGNOSIS — E559 Vitamin D deficiency, unspecified: Secondary | ICD-10-CM | POA: Diagnosis not present

## 2015-04-01 DIAGNOSIS — M899 Disorder of bone, unspecified: Secondary | ICD-10-CM | POA: Diagnosis not present

## 2015-04-01 DIAGNOSIS — Z4789 Encounter for other orthopedic aftercare: Secondary | ICD-10-CM | POA: Diagnosis not present

## 2015-04-01 DIAGNOSIS — S72391D Other fracture of shaft of right femur, subsequent encounter for closed fracture with routine healing: Secondary | ICD-10-CM | POA: Diagnosis not present

## 2015-04-01 DIAGNOSIS — I10 Essential (primary) hypertension: Secondary | ICD-10-CM | POA: Diagnosis not present

## 2015-04-01 DIAGNOSIS — E782 Mixed hyperlipidemia: Secondary | ICD-10-CM | POA: Diagnosis not present

## 2015-04-01 DIAGNOSIS — L209 Atopic dermatitis, unspecified: Secondary | ICD-10-CM | POA: Diagnosis not present

## 2015-04-01 DIAGNOSIS — R7301 Impaired fasting glucose: Secondary | ICD-10-CM | POA: Diagnosis not present

## 2015-04-01 DIAGNOSIS — S72301D Unspecified fracture of shaft of right femur, subsequent encounter for closed fracture with routine healing: Secondary | ICD-10-CM | POA: Diagnosis not present

## 2015-04-05 DIAGNOSIS — E782 Mixed hyperlipidemia: Secondary | ICD-10-CM | POA: Diagnosis not present

## 2015-04-05 DIAGNOSIS — R7301 Impaired fasting glucose: Secondary | ICD-10-CM | POA: Diagnosis not present

## 2015-04-05 DIAGNOSIS — Z1211 Encounter for screening for malignant neoplasm of colon: Secondary | ICD-10-CM | POA: Diagnosis not present

## 2015-04-05 DIAGNOSIS — Z23 Encounter for immunization: Secondary | ICD-10-CM | POA: Diagnosis not present

## 2015-04-05 DIAGNOSIS — Z1389 Encounter for screening for other disorder: Secondary | ICD-10-CM | POA: Diagnosis not present

## 2015-04-05 DIAGNOSIS — M810 Age-related osteoporosis without current pathological fracture: Secondary | ICD-10-CM | POA: Diagnosis not present

## 2015-04-05 DIAGNOSIS — I10 Essential (primary) hypertension: Secondary | ICD-10-CM | POA: Diagnosis not present

## 2015-04-05 DIAGNOSIS — Z0000 Encounter for general adult medical examination without abnormal findings: Secondary | ICD-10-CM | POA: Diagnosis not present

## 2015-04-06 DIAGNOSIS — I10 Essential (primary) hypertension: Secondary | ICD-10-CM | POA: Diagnosis not present

## 2015-04-06 DIAGNOSIS — S72391D Other fracture of shaft of right femur, subsequent encounter for closed fracture with routine healing: Secondary | ICD-10-CM | POA: Diagnosis not present

## 2015-04-08 DIAGNOSIS — S72391D Other fracture of shaft of right femur, subsequent encounter for closed fracture with routine healing: Secondary | ICD-10-CM | POA: Diagnosis not present

## 2015-04-12 DIAGNOSIS — S72391D Other fracture of shaft of right femur, subsequent encounter for closed fracture with routine healing: Secondary | ICD-10-CM | POA: Diagnosis not present

## 2015-04-14 DIAGNOSIS — S72391D Other fracture of shaft of right femur, subsequent encounter for closed fracture with routine healing: Secondary | ICD-10-CM | POA: Diagnosis not present

## 2015-04-18 DIAGNOSIS — S72391D Other fracture of shaft of right femur, subsequent encounter for closed fracture with routine healing: Secondary | ICD-10-CM | POA: Diagnosis not present

## 2015-04-22 DIAGNOSIS — S72391D Other fracture of shaft of right femur, subsequent encounter for closed fracture with routine healing: Secondary | ICD-10-CM | POA: Diagnosis not present

## 2015-04-27 DIAGNOSIS — S72391D Other fracture of shaft of right femur, subsequent encounter for closed fracture with routine healing: Secondary | ICD-10-CM | POA: Diagnosis not present

## 2015-04-29 DIAGNOSIS — S72391D Other fracture of shaft of right femur, subsequent encounter for closed fracture with routine healing: Secondary | ICD-10-CM | POA: Diagnosis not present

## 2015-05-03 DIAGNOSIS — S72391D Other fracture of shaft of right femur, subsequent encounter for closed fracture with routine healing: Secondary | ICD-10-CM | POA: Diagnosis not present

## 2015-05-05 DIAGNOSIS — M810 Age-related osteoporosis without current pathological fracture: Secondary | ICD-10-CM | POA: Diagnosis not present

## 2015-05-13 DIAGNOSIS — S72301D Unspecified fracture of shaft of right femur, subsequent encounter for closed fracture with routine healing: Secondary | ICD-10-CM | POA: Diagnosis not present

## 2015-06-28 DIAGNOSIS — Z803 Family history of malignant neoplasm of breast: Secondary | ICD-10-CM | POA: Diagnosis not present

## 2015-06-28 DIAGNOSIS — Z1231 Encounter for screening mammogram for malignant neoplasm of breast: Secondary | ICD-10-CM | POA: Diagnosis not present

## 2015-07-01 DIAGNOSIS — H2513 Age-related nuclear cataract, bilateral: Secondary | ICD-10-CM | POA: Diagnosis not present

## 2015-10-03 DIAGNOSIS — E782 Mixed hyperlipidemia: Secondary | ICD-10-CM | POA: Diagnosis not present

## 2015-10-03 DIAGNOSIS — I10 Essential (primary) hypertension: Secondary | ICD-10-CM | POA: Diagnosis not present

## 2015-10-03 DIAGNOSIS — M810 Age-related osteoporosis without current pathological fracture: Secondary | ICD-10-CM | POA: Diagnosis not present

## 2015-10-03 DIAGNOSIS — L209 Atopic dermatitis, unspecified: Secondary | ICD-10-CM | POA: Diagnosis not present

## 2015-11-07 DIAGNOSIS — M810 Age-related osteoporosis without current pathological fracture: Secondary | ICD-10-CM | POA: Diagnosis not present

## 2016-04-04 DIAGNOSIS — I10 Essential (primary) hypertension: Secondary | ICD-10-CM | POA: Diagnosis not present

## 2016-04-04 DIAGNOSIS — Z23 Encounter for immunization: Secondary | ICD-10-CM | POA: Diagnosis not present

## 2016-04-04 DIAGNOSIS — Z0000 Encounter for general adult medical examination without abnormal findings: Secondary | ICD-10-CM | POA: Diagnosis not present

## 2016-04-04 DIAGNOSIS — R7301 Impaired fasting glucose: Secondary | ICD-10-CM | POA: Diagnosis not present

## 2016-04-04 DIAGNOSIS — E782 Mixed hyperlipidemia: Secondary | ICD-10-CM | POA: Diagnosis not present

## 2016-04-04 DIAGNOSIS — M810 Age-related osteoporosis without current pathological fracture: Secondary | ICD-10-CM | POA: Diagnosis not present

## 2016-04-04 DIAGNOSIS — Z1211 Encounter for screening for malignant neoplasm of colon: Secondary | ICD-10-CM | POA: Diagnosis not present

## 2016-04-09 DIAGNOSIS — Z0000 Encounter for general adult medical examination without abnormal findings: Secondary | ICD-10-CM | POA: Diagnosis not present

## 2016-04-09 DIAGNOSIS — Z1389 Encounter for screening for other disorder: Secondary | ICD-10-CM | POA: Diagnosis not present

## 2016-04-09 DIAGNOSIS — E559 Vitamin D deficiency, unspecified: Secondary | ICD-10-CM | POA: Diagnosis not present

## 2016-04-09 DIAGNOSIS — Z01419 Encounter for gynecological examination (general) (routine) without abnormal findings: Secondary | ICD-10-CM | POA: Diagnosis not present

## 2016-04-09 DIAGNOSIS — R7301 Impaired fasting glucose: Secondary | ICD-10-CM | POA: Diagnosis not present

## 2016-04-09 DIAGNOSIS — Z23 Encounter for immunization: Secondary | ICD-10-CM | POA: Diagnosis not present

## 2016-04-09 DIAGNOSIS — R809 Proteinuria, unspecified: Secondary | ICD-10-CM | POA: Diagnosis not present

## 2016-04-09 DIAGNOSIS — M810 Age-related osteoporosis without current pathological fracture: Secondary | ICD-10-CM | POA: Diagnosis not present

## 2016-04-09 DIAGNOSIS — I10 Essential (primary) hypertension: Secondary | ICD-10-CM | POA: Diagnosis not present

## 2016-04-09 DIAGNOSIS — L209 Atopic dermatitis, unspecified: Secondary | ICD-10-CM | POA: Diagnosis not present

## 2016-04-09 DIAGNOSIS — E782 Mixed hyperlipidemia: Secondary | ICD-10-CM | POA: Diagnosis not present

## 2016-04-16 DIAGNOSIS — M899 Disorder of bone, unspecified: Secondary | ICD-10-CM | POA: Diagnosis not present

## 2016-05-09 DIAGNOSIS — M810 Age-related osteoporosis without current pathological fracture: Secondary | ICD-10-CM | POA: Diagnosis not present

## 2016-07-02 DIAGNOSIS — H2513 Age-related nuclear cataract, bilateral: Secondary | ICD-10-CM | POA: Diagnosis not present

## 2016-07-10 IMAGING — CR DG CHEST 1V PORT
1 series · 1 of 1 positions shown · non-contrast
Comparison: 09/17/2013

CLINICAL DATA: Preop.  Femoral for

EXAM:
PORTABLE CHEST - 1 VIEW

[AP]
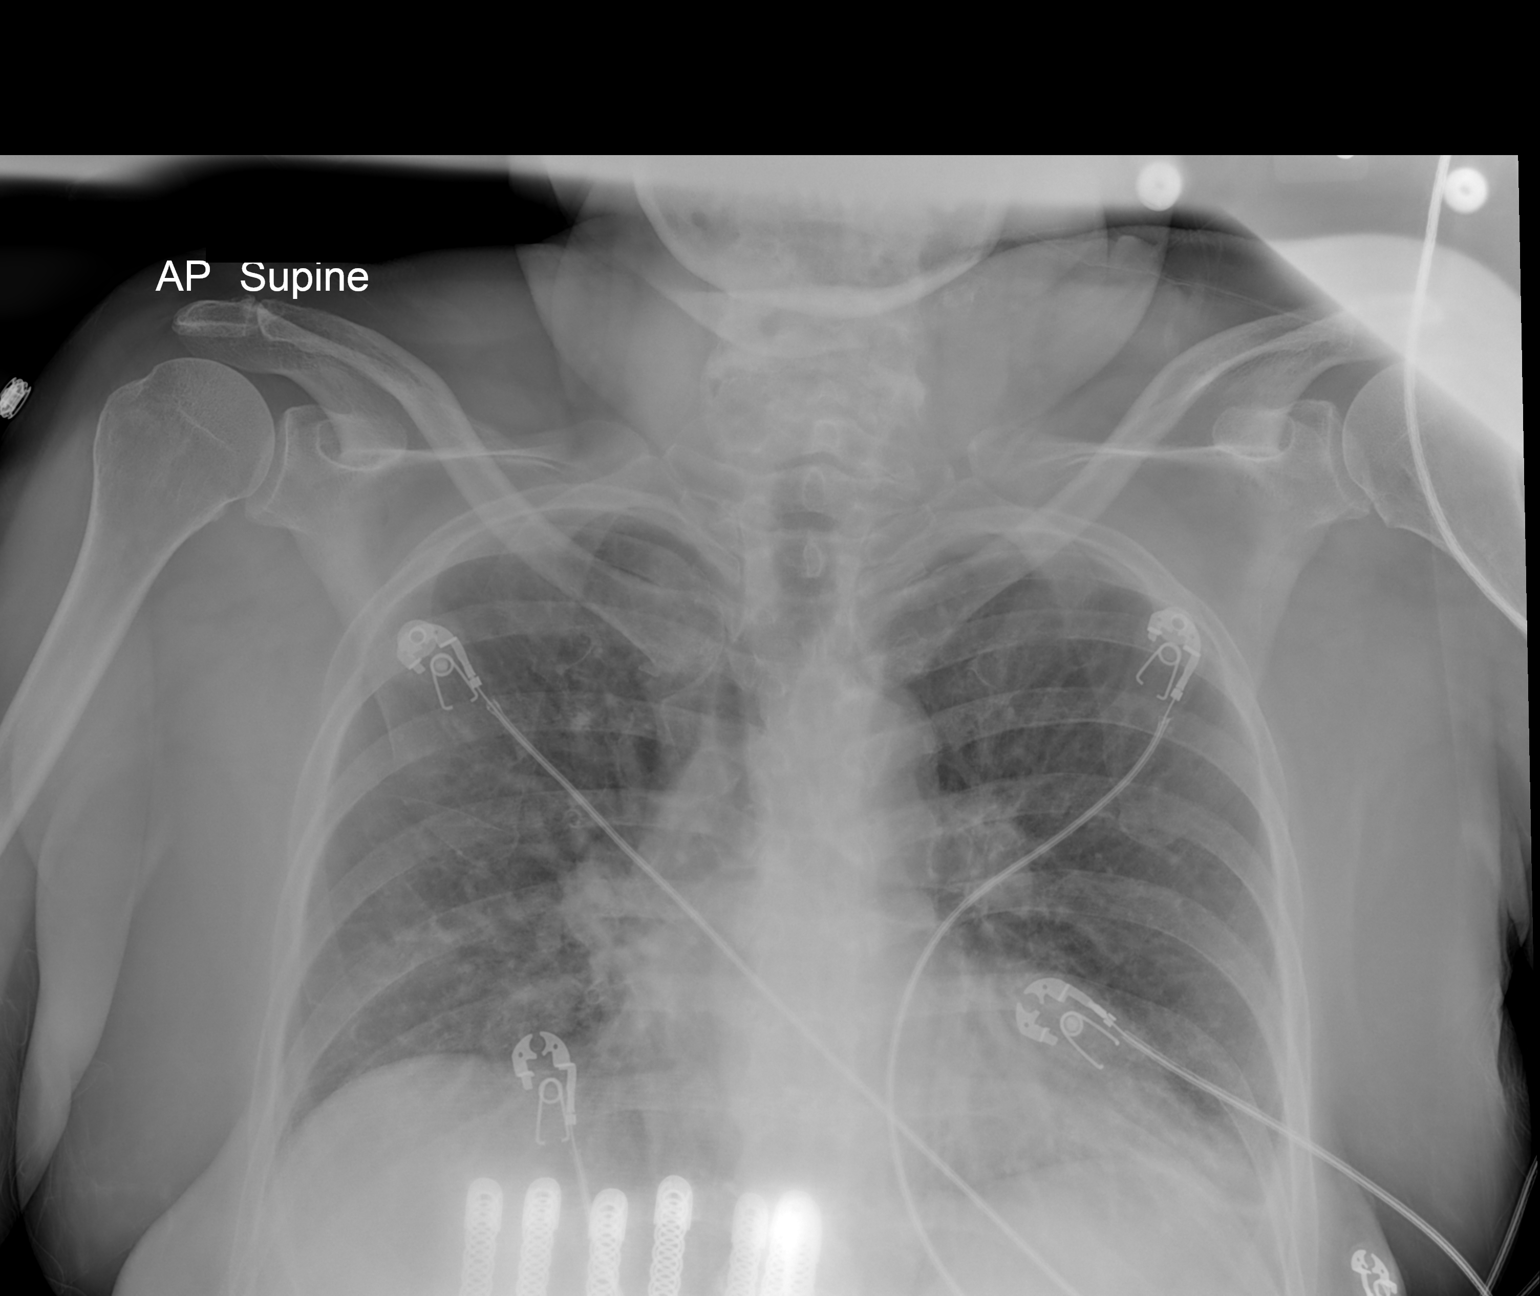

[1 of 1 positions shown; findings below may reference images not displayed]

FINDINGS: There is a shallow inspiration. The lungs are grossly clear. There
is no large effusion. There is no pneumothorax.
IMPRESSION: No acute cardiopulmonary findings.

## 2016-10-04 DIAGNOSIS — R809 Proteinuria, unspecified: Secondary | ICD-10-CM | POA: Diagnosis not present

## 2016-10-04 DIAGNOSIS — I10 Essential (primary) hypertension: Secondary | ICD-10-CM | POA: Diagnosis not present

## 2016-10-04 DIAGNOSIS — E782 Mixed hyperlipidemia: Secondary | ICD-10-CM | POA: Diagnosis not present

## 2016-10-04 DIAGNOSIS — E559 Vitamin D deficiency, unspecified: Secondary | ICD-10-CM | POA: Diagnosis not present

## 2016-10-04 DIAGNOSIS — R7301 Impaired fasting glucose: Secondary | ICD-10-CM | POA: Diagnosis not present

## 2016-10-04 DIAGNOSIS — M810 Age-related osteoporosis without current pathological fracture: Secondary | ICD-10-CM | POA: Diagnosis not present

## 2016-10-04 DIAGNOSIS — Z23 Encounter for immunization: Secondary | ICD-10-CM | POA: Diagnosis not present

## 2016-10-04 DIAGNOSIS — Z0000 Encounter for general adult medical examination without abnormal findings: Secondary | ICD-10-CM | POA: Diagnosis not present

## 2016-10-09 DIAGNOSIS — M810 Age-related osteoporosis without current pathological fracture: Secondary | ICD-10-CM | POA: Diagnosis not present

## 2016-10-09 DIAGNOSIS — I10 Essential (primary) hypertension: Secondary | ICD-10-CM | POA: Diagnosis not present

## 2016-10-09 DIAGNOSIS — L209 Atopic dermatitis, unspecified: Secondary | ICD-10-CM | POA: Diagnosis not present

## 2016-10-09 DIAGNOSIS — E782 Mixed hyperlipidemia: Secondary | ICD-10-CM | POA: Diagnosis not present

## 2016-11-08 DIAGNOSIS — M810 Age-related osteoporosis without current pathological fracture: Secondary | ICD-10-CM | POA: Diagnosis not present

## 2017-04-08 DIAGNOSIS — Z0000 Encounter for general adult medical examination without abnormal findings: Secondary | ICD-10-CM | POA: Diagnosis not present

## 2017-04-08 DIAGNOSIS — E782 Mixed hyperlipidemia: Secondary | ICD-10-CM | POA: Diagnosis not present

## 2017-04-08 DIAGNOSIS — Z23 Encounter for immunization: Secondary | ICD-10-CM | POA: Diagnosis not present

## 2017-04-08 DIAGNOSIS — R7301 Impaired fasting glucose: Secondary | ICD-10-CM | POA: Diagnosis not present

## 2017-04-08 DIAGNOSIS — E559 Vitamin D deficiency, unspecified: Secondary | ICD-10-CM | POA: Diagnosis not present

## 2017-04-08 DIAGNOSIS — I10 Essential (primary) hypertension: Secondary | ICD-10-CM | POA: Diagnosis not present

## 2017-04-08 DIAGNOSIS — R809 Proteinuria, unspecified: Secondary | ICD-10-CM | POA: Diagnosis not present

## 2017-04-08 DIAGNOSIS — Z79899 Other long term (current) drug therapy: Secondary | ICD-10-CM | POA: Diagnosis not present

## 2017-04-08 DIAGNOSIS — M810 Age-related osteoporosis without current pathological fracture: Secondary | ICD-10-CM | POA: Diagnosis not present

## 2017-04-12 DIAGNOSIS — I10 Essential (primary) hypertension: Secondary | ICD-10-CM | POA: Diagnosis not present

## 2017-04-12 DIAGNOSIS — E782 Mixed hyperlipidemia: Secondary | ICD-10-CM | POA: Diagnosis not present

## 2017-04-12 DIAGNOSIS — M810 Age-related osteoporosis without current pathological fracture: Secondary | ICD-10-CM | POA: Diagnosis not present

## 2017-04-12 DIAGNOSIS — R7301 Impaired fasting glucose: Secondary | ICD-10-CM | POA: Diagnosis not present

## 2017-04-12 DIAGNOSIS — Z0000 Encounter for general adult medical examination without abnormal findings: Secondary | ICD-10-CM | POA: Diagnosis not present

## 2017-04-12 DIAGNOSIS — E559 Vitamin D deficiency, unspecified: Secondary | ICD-10-CM | POA: Diagnosis not present

## 2017-04-12 DIAGNOSIS — Z23 Encounter for immunization: Secondary | ICD-10-CM | POA: Diagnosis not present

## 2017-04-12 DIAGNOSIS — L209 Atopic dermatitis, unspecified: Secondary | ICD-10-CM | POA: Diagnosis not present

## 2017-04-15 DIAGNOSIS — I10 Essential (primary) hypertension: Secondary | ICD-10-CM | POA: Diagnosis not present

## 2017-04-15 DIAGNOSIS — E782 Mixed hyperlipidemia: Secondary | ICD-10-CM | POA: Diagnosis not present

## 2017-04-15 DIAGNOSIS — M810 Age-related osteoporosis without current pathological fracture: Secondary | ICD-10-CM | POA: Diagnosis not present

## 2017-04-15 DIAGNOSIS — L209 Atopic dermatitis, unspecified: Secondary | ICD-10-CM | POA: Diagnosis not present

## 2017-04-15 DIAGNOSIS — E559 Vitamin D deficiency, unspecified: Secondary | ICD-10-CM | POA: Diagnosis not present

## 2017-04-15 DIAGNOSIS — Z79899 Other long term (current) drug therapy: Secondary | ICD-10-CM | POA: Diagnosis not present

## 2017-04-15 DIAGNOSIS — Z0000 Encounter for general adult medical examination without abnormal findings: Secondary | ICD-10-CM | POA: Diagnosis not present

## 2017-04-15 DIAGNOSIS — Z23 Encounter for immunization: Secondary | ICD-10-CM | POA: Diagnosis not present

## 2017-04-15 DIAGNOSIS — R809 Proteinuria, unspecified: Secondary | ICD-10-CM | POA: Diagnosis not present

## 2017-04-15 DIAGNOSIS — R7301 Impaired fasting glucose: Secondary | ICD-10-CM | POA: Diagnosis not present

## 2017-05-13 DIAGNOSIS — M810 Age-related osteoporosis without current pathological fracture: Secondary | ICD-10-CM | POA: Diagnosis not present

## 2017-05-20 DIAGNOSIS — Z803 Family history of malignant neoplasm of breast: Secondary | ICD-10-CM | POA: Diagnosis not present

## 2017-05-20 DIAGNOSIS — Z1231 Encounter for screening mammogram for malignant neoplasm of breast: Secondary | ICD-10-CM | POA: Diagnosis not present

## 2017-05-28 DIAGNOSIS — N6001 Solitary cyst of right breast: Secondary | ICD-10-CM | POA: Diagnosis not present

## 2017-05-28 DIAGNOSIS — R922 Inconclusive mammogram: Secondary | ICD-10-CM | POA: Diagnosis not present

## 2017-07-18 DIAGNOSIS — H2513 Age-related nuclear cataract, bilateral: Secondary | ICD-10-CM | POA: Diagnosis not present

## 2017-10-03 DIAGNOSIS — S0101XA Laceration without foreign body of scalp, initial encounter: Secondary | ICD-10-CM | POA: Diagnosis not present

## 2017-10-03 DIAGNOSIS — W19XXXA Unspecified fall, initial encounter: Secondary | ICD-10-CM | POA: Diagnosis not present

## 2017-10-14 DIAGNOSIS — L209 Atopic dermatitis, unspecified: Secondary | ICD-10-CM | POA: Diagnosis not present

## 2017-10-14 DIAGNOSIS — Z23 Encounter for immunization: Secondary | ICD-10-CM | POA: Diagnosis not present

## 2017-10-14 DIAGNOSIS — Z79899 Other long term (current) drug therapy: Secondary | ICD-10-CM | POA: Diagnosis not present

## 2017-10-14 DIAGNOSIS — E559 Vitamin D deficiency, unspecified: Secondary | ICD-10-CM | POA: Diagnosis not present

## 2017-10-14 DIAGNOSIS — Z0000 Encounter for general adult medical examination without abnormal findings: Secondary | ICD-10-CM | POA: Diagnosis not present

## 2017-10-14 DIAGNOSIS — R7301 Impaired fasting glucose: Secondary | ICD-10-CM | POA: Diagnosis not present

## 2017-10-14 DIAGNOSIS — R809 Proteinuria, unspecified: Secondary | ICD-10-CM | POA: Diagnosis not present

## 2017-10-14 DIAGNOSIS — M810 Age-related osteoporosis without current pathological fracture: Secondary | ICD-10-CM | POA: Diagnosis not present

## 2017-10-14 DIAGNOSIS — E782 Mixed hyperlipidemia: Secondary | ICD-10-CM | POA: Diagnosis not present

## 2017-10-14 DIAGNOSIS — I10 Essential (primary) hypertension: Secondary | ICD-10-CM | POA: Diagnosis not present

## 2017-10-18 DIAGNOSIS — I10 Essential (primary) hypertension: Secondary | ICD-10-CM | POA: Diagnosis not present

## 2017-10-18 DIAGNOSIS — E782 Mixed hyperlipidemia: Secondary | ICD-10-CM | POA: Diagnosis not present

## 2017-10-18 DIAGNOSIS — R7989 Other specified abnormal findings of blood chemistry: Secondary | ICD-10-CM | POA: Diagnosis not present

## 2017-10-18 DIAGNOSIS — S0101XD Laceration without foreign body of scalp, subsequent encounter: Secondary | ICD-10-CM | POA: Diagnosis not present

## 2017-10-18 DIAGNOSIS — M1990 Unspecified osteoarthritis, unspecified site: Secondary | ICD-10-CM | POA: Diagnosis not present

## 2017-10-18 DIAGNOSIS — M810 Age-related osteoporosis without current pathological fracture: Secondary | ICD-10-CM | POA: Diagnosis not present

## 2017-10-18 DIAGNOSIS — L209 Atopic dermatitis, unspecified: Secondary | ICD-10-CM | POA: Diagnosis not present

## 2017-10-18 DIAGNOSIS — R7301 Impaired fasting glucose: Secondary | ICD-10-CM | POA: Diagnosis not present

## 2017-11-12 DIAGNOSIS — M810 Age-related osteoporosis without current pathological fracture: Secondary | ICD-10-CM | POA: Diagnosis not present

## 2018-04-28 DIAGNOSIS — Z0000 Encounter for general adult medical examination without abnormal findings: Secondary | ICD-10-CM | POA: Diagnosis not present

## 2018-04-28 DIAGNOSIS — L209 Atopic dermatitis, unspecified: Secondary | ICD-10-CM | POA: Diagnosis not present

## 2018-04-28 DIAGNOSIS — R7301 Impaired fasting glucose: Secondary | ICD-10-CM | POA: Diagnosis not present

## 2018-04-28 DIAGNOSIS — R809 Proteinuria, unspecified: Secondary | ICD-10-CM | POA: Diagnosis not present

## 2018-04-28 DIAGNOSIS — Z79899 Other long term (current) drug therapy: Secondary | ICD-10-CM | POA: Diagnosis not present

## 2018-04-28 DIAGNOSIS — E559 Vitamin D deficiency, unspecified: Secondary | ICD-10-CM | POA: Diagnosis not present

## 2018-04-28 DIAGNOSIS — Z23 Encounter for immunization: Secondary | ICD-10-CM | POA: Diagnosis not present

## 2018-04-28 DIAGNOSIS — E782 Mixed hyperlipidemia: Secondary | ICD-10-CM | POA: Diagnosis not present

## 2018-04-28 DIAGNOSIS — I10 Essential (primary) hypertension: Secondary | ICD-10-CM | POA: Diagnosis not present

## 2018-04-28 DIAGNOSIS — M810 Age-related osteoporosis without current pathological fracture: Secondary | ICD-10-CM | POA: Diagnosis not present

## 2018-05-01 DIAGNOSIS — I10 Essential (primary) hypertension: Secondary | ICD-10-CM | POA: Diagnosis not present

## 2018-05-01 DIAGNOSIS — Z0000 Encounter for general adult medical examination without abnormal findings: Secondary | ICD-10-CM | POA: Diagnosis not present

## 2018-05-01 DIAGNOSIS — Z23 Encounter for immunization: Secondary | ICD-10-CM | POA: Diagnosis not present

## 2018-05-01 DIAGNOSIS — M810 Age-related osteoporosis without current pathological fracture: Secondary | ICD-10-CM | POA: Diagnosis not present

## 2018-05-01 DIAGNOSIS — R7989 Other specified abnormal findings of blood chemistry: Secondary | ICD-10-CM | POA: Diagnosis not present

## 2018-05-01 DIAGNOSIS — M1990 Unspecified osteoarthritis, unspecified site: Secondary | ICD-10-CM | POA: Diagnosis not present

## 2018-05-01 DIAGNOSIS — Z1389 Encounter for screening for other disorder: Secondary | ICD-10-CM | POA: Diagnosis not present

## 2018-05-01 DIAGNOSIS — R7301 Impaired fasting glucose: Secondary | ICD-10-CM | POA: Diagnosis not present

## 2018-05-01 DIAGNOSIS — E782 Mixed hyperlipidemia: Secondary | ICD-10-CM | POA: Diagnosis not present

## 2018-05-15 DIAGNOSIS — M810 Age-related osteoporosis without current pathological fracture: Secondary | ICD-10-CM | POA: Diagnosis not present

## 2018-05-21 DIAGNOSIS — Z780 Asymptomatic menopausal state: Secondary | ICD-10-CM | POA: Diagnosis not present

## 2018-05-21 DIAGNOSIS — Z8262 Family history of osteoporosis: Secondary | ICD-10-CM | POA: Diagnosis not present

## 2018-05-21 DIAGNOSIS — E559 Vitamin D deficiency, unspecified: Secondary | ICD-10-CM | POA: Diagnosis not present

## 2018-05-21 DIAGNOSIS — Z1231 Encounter for screening mammogram for malignant neoplasm of breast: Secondary | ICD-10-CM | POA: Diagnosis not present

## 2018-05-21 DIAGNOSIS — E349 Endocrine disorder, unspecified: Secondary | ICD-10-CM | POA: Diagnosis not present

## 2018-07-08 DIAGNOSIS — H2513 Age-related nuclear cataract, bilateral: Secondary | ICD-10-CM | POA: Diagnosis not present

## 2018-08-12 DIAGNOSIS — R7301 Impaired fasting glucose: Secondary | ICD-10-CM | POA: Diagnosis not present

## 2018-08-12 DIAGNOSIS — I10 Essential (primary) hypertension: Secondary | ICD-10-CM | POA: Diagnosis not present

## 2018-08-12 DIAGNOSIS — E782 Mixed hyperlipidemia: Secondary | ICD-10-CM | POA: Diagnosis not present

## 2018-08-12 DIAGNOSIS — M1990 Unspecified osteoarthritis, unspecified site: Secondary | ICD-10-CM | POA: Diagnosis not present

## 2018-08-12 DIAGNOSIS — M810 Age-related osteoporosis without current pathological fracture: Secondary | ICD-10-CM | POA: Diagnosis not present

## 2018-08-12 DIAGNOSIS — E559 Vitamin D deficiency, unspecified: Secondary | ICD-10-CM | POA: Diagnosis not present

## 2018-11-04 DIAGNOSIS — R7301 Impaired fasting glucose: Secondary | ICD-10-CM | POA: Diagnosis not present

## 2018-11-04 DIAGNOSIS — M810 Age-related osteoporosis without current pathological fracture: Secondary | ICD-10-CM | POA: Diagnosis not present

## 2018-11-04 DIAGNOSIS — E782 Mixed hyperlipidemia: Secondary | ICD-10-CM | POA: Diagnosis not present

## 2018-11-04 DIAGNOSIS — I10 Essential (primary) hypertension: Secondary | ICD-10-CM | POA: Diagnosis not present

## 2018-11-04 DIAGNOSIS — M1990 Unspecified osteoarthritis, unspecified site: Secondary | ICD-10-CM | POA: Diagnosis not present

## 2018-11-04 DIAGNOSIS — E559 Vitamin D deficiency, unspecified: Secondary | ICD-10-CM | POA: Diagnosis not present

## 2018-11-07 DIAGNOSIS — M810 Age-related osteoporosis without current pathological fracture: Secondary | ICD-10-CM | POA: Diagnosis not present

## 2018-11-07 DIAGNOSIS — M1990 Unspecified osteoarthritis, unspecified site: Secondary | ICD-10-CM | POA: Diagnosis not present

## 2018-11-07 DIAGNOSIS — R7301 Impaired fasting glucose: Secondary | ICD-10-CM | POA: Diagnosis not present

## 2018-11-07 DIAGNOSIS — R7989 Other specified abnormal findings of blood chemistry: Secondary | ICD-10-CM | POA: Diagnosis not present

## 2018-11-07 DIAGNOSIS — E782 Mixed hyperlipidemia: Secondary | ICD-10-CM | POA: Diagnosis not present

## 2018-11-07 DIAGNOSIS — I10 Essential (primary) hypertension: Secondary | ICD-10-CM | POA: Diagnosis not present

## 2018-11-07 DIAGNOSIS — L209 Atopic dermatitis, unspecified: Secondary | ICD-10-CM | POA: Diagnosis not present

## 2018-11-17 DIAGNOSIS — M810 Age-related osteoporosis without current pathological fracture: Secondary | ICD-10-CM | POA: Diagnosis not present

## 2019-04-07 DIAGNOSIS — Z23 Encounter for immunization: Secondary | ICD-10-CM | POA: Diagnosis not present

## 2019-05-06 DIAGNOSIS — E782 Mixed hyperlipidemia: Secondary | ICD-10-CM | POA: Diagnosis not present

## 2019-05-06 DIAGNOSIS — M1990 Unspecified osteoarthritis, unspecified site: Secondary | ICD-10-CM | POA: Diagnosis not present

## 2019-05-06 DIAGNOSIS — L209 Atopic dermatitis, unspecified: Secondary | ICD-10-CM | POA: Diagnosis not present

## 2019-05-06 DIAGNOSIS — R7301 Impaired fasting glucose: Secondary | ICD-10-CM | POA: Diagnosis not present

## 2019-05-06 DIAGNOSIS — M810 Age-related osteoporosis without current pathological fracture: Secondary | ICD-10-CM | POA: Diagnosis not present

## 2019-05-06 DIAGNOSIS — E559 Vitamin D deficiency, unspecified: Secondary | ICD-10-CM | POA: Diagnosis not present

## 2019-05-06 DIAGNOSIS — I10 Essential (primary) hypertension: Secondary | ICD-10-CM | POA: Diagnosis not present

## 2019-05-11 DIAGNOSIS — E559 Vitamin D deficiency, unspecified: Secondary | ICD-10-CM | POA: Diagnosis not present

## 2019-05-11 DIAGNOSIS — L209 Atopic dermatitis, unspecified: Secondary | ICD-10-CM | POA: Diagnosis not present

## 2019-05-11 DIAGNOSIS — R252 Cramp and spasm: Secondary | ICD-10-CM | POA: Diagnosis not present

## 2019-05-11 DIAGNOSIS — M1990 Unspecified osteoarthritis, unspecified site: Secondary | ICD-10-CM | POA: Diagnosis not present

## 2019-05-11 DIAGNOSIS — Z6828 Body mass index (BMI) 28.0-28.9, adult: Secondary | ICD-10-CM | POA: Diagnosis not present

## 2019-05-11 DIAGNOSIS — R7301 Impaired fasting glucose: Secondary | ICD-10-CM | POA: Diagnosis not present

## 2019-05-11 DIAGNOSIS — M810 Age-related osteoporosis without current pathological fracture: Secondary | ICD-10-CM | POA: Diagnosis not present

## 2019-05-11 DIAGNOSIS — I10 Essential (primary) hypertension: Secondary | ICD-10-CM | POA: Diagnosis not present

## 2019-05-11 DIAGNOSIS — E782 Mixed hyperlipidemia: Secondary | ICD-10-CM | POA: Diagnosis not present

## 2019-05-22 DIAGNOSIS — M810 Age-related osteoporosis without current pathological fracture: Secondary | ICD-10-CM | POA: Diagnosis not present

## 2019-05-27 DIAGNOSIS — Z803 Family history of malignant neoplasm of breast: Secondary | ICD-10-CM | POA: Diagnosis not present

## 2019-05-27 DIAGNOSIS — Z1231 Encounter for screening mammogram for malignant neoplasm of breast: Secondary | ICD-10-CM | POA: Diagnosis not present

## 2019-06-08 DIAGNOSIS — Z0000 Encounter for general adult medical examination without abnormal findings: Secondary | ICD-10-CM | POA: Diagnosis not present

## 2019-09-20 ENCOUNTER — Ambulatory Visit: Payer: Medicare Other | Attending: Internal Medicine

## 2019-09-20 DIAGNOSIS — Z23 Encounter for immunization: Secondary | ICD-10-CM | POA: Insufficient documentation

## 2019-09-20 NOTE — Progress Notes (Signed)
   Covid-19 Vaccination Clinic  Name:  Lisa White    MRN: [id number] DOB: [birth year]  09/20/2019  Ms. Doffing was observed post Covid-19 immunization for 15 minutes without incidence. She was provided with Vaccine Information Sheet and instruction to access the V-Safe system.   [name] was instructed to call 911 with any severe reactions post vaccine: Marland Kitchen Difficulty breathing  . Swelling of your face and throat  . A fast heartbeat  . A bad rash all over your body  . Dizziness and weakness    Immunizations Administered    Name Date Dose VIS Date Route   Pfizer COVID-19 Vaccine 09/20/2019  8:34 AM 0.3 mL 07/03/2019 Intramuscular   Manufacturer: [employer], [employer]   Lot: [id number]   NDC: [address]-3

## 2019-10-14 ENCOUNTER — Ambulatory Visit: Payer: Medicare Other | Attending: Internal Medicine

## 2019-10-14 DIAGNOSIS — Z23 Encounter for immunization: Secondary | ICD-10-CM

## 2019-10-14 NOTE — Progress Notes (Signed)
   Covid-19 Vaccination Clinic  Name:  Lisa White    MRN: [id number] DOB: January 02, 1936  10/14/2019  [name] was observed post Covid-19 immunization for 15 minutes without incident. She was provided with Vaccine Information Sheet and instruction to access the V-Safe system.   [name] was instructed to call 911 with any severe reactions post vaccine: Marland Kitchen Difficulty breathing  . Swelling of face and throat  . A fast heartbeat  . A bad rash all over body  . Dizziness and weakness   Immunizations Administered    Name Date Dose VIS Date Route   Pfizer COVID-19 Vaccine 10/14/2019  9:01 AM 0.3 mL 07/03/2019 Intramuscular   Manufacturer: [employer], [employer]   Lot: [id number]   NDC: [address]-5

## 2023-08-16 ENCOUNTER — Encounter: Payer: 59 | Attending: Dietician | Admitting: Dietician

## 2023-08-16 ENCOUNTER — Encounter: Payer: Self-pay | Admitting: Dietician

## 2023-08-16 VITALS — Wt 167.0 lb

## 2023-08-16 DIAGNOSIS — R7303 Prediabetes: Secondary | ICD-10-CM

## 2023-08-16 NOTE — Progress Notes (Signed)
Patient was seen on 08/16/23 for the Core Session 1 of Diabetes Prevention Program course at [facility]. The following learning objectives were met by the patient during this class:   Learning Objectives:  Be able to explain the purpose and benefits of the National Diabetes Prevention Program.  Be able to describe the events that will take place at every session.  Know the weight loss and physical activity goals established by the [facility] Diabetes Prevention Program.  Know their own individual weight loss and physical activity goals.  Be able to explain the important effect of self-monitoring on behavior change.   Goals:  Record food and beverage intake in "Food and Activity Tracker" over the next week.  Bring completed "Food and Activity Tracker" for session 1 to session 2 next week. Circle the foods or beverages you think are highest in fat and calories in your food tracker. Read the labels on the food you buy, and consider using measuring cups and spoons to help you calculate the amount you eat. We will talk about measuring in more detail in the coming weeks.   Follow-Up Plan: Attend Core Session 2 next week.  Bring completed "Food and Activity Tracker" next week to be reviewed by Lifestyle Coach.

## 2023-08-23 ENCOUNTER — Encounter: Payer: Self-pay | Admitting: Dietician

## 2023-08-23 ENCOUNTER — Encounter: Payer: 59 | Admitting: Dietician

## 2023-08-23 VITALS — Wt 167.0 lb

## 2023-08-23 DIAGNOSIS — R7303 Prediabetes: Secondary | ICD-10-CM

## 2023-08-23 NOTE — Progress Notes (Signed)
Patient was seen on 08/23/23 for the Core Session 2 of Diabetes Prevention Program course at [facility]. By the end of this session patients are able to complete the following objectives:   Learning Objectives: Self-monitor their weight during the weeks following Session 2.  Describe the relationship between fat and calories.  Explain the reason for, and basic principles of, self-monitoring fat grams and calories.  Identify their personal fat gram goals.  Use the ?Fat and Calorie Counter? to calculate the calories and fat grams of a given selection of foods.  Keep a running total of the fat grams they eat each day.  Calculate fat, calories, and serving sizes from nutrition labels.   Goals:  Weigh yourself at the same time each day, or every few days, and record your weight in your Food and Activity Tracker. Write down everything you eat and drink in your Food and Activity Tracker. Measure portions as much as you can, and start reading labels.  Use the ?Fat and Calorie Counter? to figure out the amount of fat and calories in what you ate, and write the amount down in your Food and Activity Tracker. Keep a running fat gram total throughout the day. Come as close to your fat gram goal as you can.   Follow-Up Plan: Attend Core Session 3 next week.  Bring completed "Food and Activity Tracker" next week to be reviewed by Lifestyle Coach.

## 2023-08-30 ENCOUNTER — Encounter: Payer: Self-pay | Admitting: Dietician

## 2023-08-30 ENCOUNTER — Encounter: Payer: Medicare Other | Attending: Family Medicine | Admitting: Dietician

## 2023-08-30 VITALS — Wt 168.0 lb

## 2023-08-30 DIAGNOSIS — R7303 Prediabetes: Secondary | ICD-10-CM

## 2023-08-30 NOTE — Progress Notes (Signed)
 Patient was seen on 08/30/23 for the Core Session 3 of Diabetes Prevention Program course at [facility]. By the end of this session patients are able to complete the following objectives:   Learning Objectives: Weigh and measure foods. Estimate the fat and calorie content of common foods. Describe three ways to eat less fat and fewer calories. Create a plan to eat less fat for the following week.   Goals:  Track weight when weighing outside of class.  Track food and beverages eaten each day in Food and Activity Tracker and include fat grams and calories for each.  Try to stay within fat gram goal.  Complete plan for eating less high fat foods and answer related homework questions.    Follow-Up Plan: Attend Core Session 4 next week.  Bring completed Food and Activity Tracker next week to be reviewed by Lifestyle Coach.

## 2023-09-06 ENCOUNTER — Encounter (HOSPITAL_BASED_OUTPATIENT_CLINIC_OR_DEPARTMENT_OTHER): Payer: Medicare Other | Admitting: Dietician

## 2023-09-06 ENCOUNTER — Encounter: Payer: Self-pay | Admitting: Dietician

## 2023-09-06 DIAGNOSIS — R7303 Prediabetes: Secondary | ICD-10-CM | POA: Diagnosis not present

## 2023-09-06 NOTE — Progress Notes (Signed)
Patient was seen on 09/06/23 for the Core Session 4 of Diabetes Prevention Program course at [facility]. By the end of this session patients are able to complete the following objectives:   Learning Objectives: Explain the health benefits of eating less fat and fewer calories. Describe the MyPlate food guide and its recommendations, including how to reduce fat and calories in our diet. Compare and contrast MyPlate guidelines with participants' eating habits. List ways to replace high-fat and high-calorie foods with low-fat and low-calorie foods. Explain the importance of eating plenty of whole grains, vegetables, and fruits, while staying within fat gram goals. Explain the importance of eating foods from all groups of MyPlate and of eating a variety of foods from within each group. Explain why a balanced diet is beneficial to health. Explain why eating the same foods over and over is not the best strategy for long-term success.   Goals:  Record weight taken outside of class.  Track foods and beverages eaten each day in the "Food and Activity Tracker," including calories and fat grams for each item.  Practice comparing what you eat with the recommendations of MyPlate using the "Rate Your Plate" handout.  Complete the "Rate Your Plate" handout form on at least 3 days.  Answer homework questions.   Follow-Up Plan: Attend Core Session 5 next week.  Bring completed "Food and Activity Tracker" next week to be reviewed by Lifestyle Coach.

## 2023-09-20 ENCOUNTER — Encounter (HOSPITAL_BASED_OUTPATIENT_CLINIC_OR_DEPARTMENT_OTHER): Payer: Medicare Other | Admitting: Dietician

## 2023-09-20 ENCOUNTER — Encounter: Payer: Self-pay | Admitting: Dietician

## 2023-09-20 DIAGNOSIS — R7303 Prediabetes: Secondary | ICD-10-CM

## 2023-09-20 NOTE — Progress Notes (Signed)
 Patient was seen on 09/20/23 for the Core Session 5 of Diabetes Prevention Program course at [facility]. By the end of this session patients are able to complete the following objectives:   Learning Objectives: Establish a physical activity goal. Explain the importance of the physical activity goal. Describe their current level of physical activity. Name ways that they are already physically active. Develop personal plans for physical activity for the next week.   Goals:  Record weight taken outside of class.  Track foods and beverages eaten each day in the "Food and Activity Tracker," including calories and fat grams for each item.  Make an Activity Plan including date, specific type of activity, and length of time you plan to be active that includes at last 60 minutes of activity for the week.  Track activity type, minutes you were active, and distance you reached each day in the "Food and Activity Tracker."   Follow-Up Plan: Attend Core Session 6 next week.  Bring completed "Food and Activity Tracker" next week to be reviewed by Lifestyle Coach.

## 2023-09-27 ENCOUNTER — Encounter: Payer: Self-pay | Admitting: Dietician

## 2023-09-27 ENCOUNTER — Encounter: Payer: 59 | Attending: Family Medicine | Admitting: Dietician

## 2023-09-27 DIAGNOSIS — R7303 Prediabetes: Secondary | ICD-10-CM

## 2023-09-27 NOTE — Progress Notes (Signed)
 Patient was seen on 09/27/23 for the Core Session 6 of Diabetes Prevention Program course at [facility]. By the end of this session patients are able to complete the following objectives:   Learning Objectives: Graph their daily physical activity.  Describe two ways of finding the time to be active.  Define "lifestyle activity."  Describe how to prevent injury.  Develop an activity plan for the coming week.   Goals:  Record weight taken outside of class.  Track foods and beverages eaten each day in the "Food and Activity Tracker," including calories and fat grams for each item.   Track activity type, minutes you were active, and distance you reached each day in the "Food and Activity Tracker."  Set aside one 20 to 30-minute block of time every day or find two or more periods of 10 to15 minutes each for physical activity.  Warm up, cool down, and stretch. Make a Physical Activities Plan for the Week.   Follow-Up Plan: Attend Core Session 7 next week.  Bring completed "Food and Activity Tracker" next week to be reviewed by Lifestyle Coach.

## 2023-10-11 ENCOUNTER — Encounter: Payer: Self-pay | Admitting: Dietician

## 2023-10-11 ENCOUNTER — Encounter (HOSPITAL_BASED_OUTPATIENT_CLINIC_OR_DEPARTMENT_OTHER): Payer: 59 | Admitting: Dietician

## 2023-10-11 DIAGNOSIS — R7303 Prediabetes: Secondary | ICD-10-CM

## 2023-10-11 NOTE — Progress Notes (Signed)
 Patient was seen on 10/11/2023 for the Core Session 7 of Diabetes Prevention Program course at [facility]. By the end of this session patients are able to complete the following objectives:   Learning Objectives: Define calorie balance. Explain how healthy eating and being active are related in terms of calorie balance.  Describe the relationship between calorie balance and weight loss.  Describe his or her progress as it relates to calorie balance.  Develop an activity plan for the coming week.   Goals:  Record weight taken outside of class.  Track foods and beverages eaten each day in the "Food and Activity Tracker," including calories and fat grams for each item.   Track activity type, minutes you were active, and distance you reached each day in the "Food and Activity Tracker."  Set aside one 20 to 30-minute block of time every day or find two or more periods of 10 to15 minutes each for physical activity.  Make a Physical Activities Plan for the Week.  Make active lifestyle choices all through the day  Stay at or go slightly over activity goal.   Follow-Up Plan: Attend Core Session 8 next week.  Bring completed "Food and Activity Tracker" next week to be reviewed by Lifestyle Coach.

## 2023-10-18 ENCOUNTER — Encounter: Payer: 59 | Admitting: Dietician

## 2023-10-18 ENCOUNTER — Encounter: Payer: Self-pay | Admitting: Dietician

## 2023-10-18 DIAGNOSIS — R7303 Prediabetes: Secondary | ICD-10-CM

## 2023-10-18 NOTE — Progress Notes (Signed)
 Patient was seen on 10/18/23 for the Core Session 8 of Diabetes Prevention Program course at [facility]. By the end of this session patients are able to complete the following objectives:   Learning Objectives: Recognize positive and negative food and activity cues.  Change negative food and activity cues to positive cues.  Add positive cues for activity and eliminate cues for inactivity.  Develop a plan for removing one problem food cue for the coming week.   Goals:  Record weight taken outside of class.  Track foods and beverages eaten each day in the "Food and Activity Tracker," including calories and fat grams for each item.   Track activity type, minutes you were active, and distance you reached each day in the "Food and Activity Tracker."  Set aside one 20 to 30-minute block of time every day or find two or more periods of 10 to15 minutes each for physical activity.  Remove one problem food cue.  Add one positive cue for being more active.  Follow-Up Plan: Attend Core Session 9 next week.  Bring completed "Food and Activity Tracker" next week to be reviewed by Lifestyle Coach.

## 2023-10-25 ENCOUNTER — Encounter: Payer: 59 | Attending: Dietician | Admitting: Dietician

## 2023-10-25 ENCOUNTER — Encounter: Payer: Self-pay | Admitting: Dietician

## 2023-10-25 DIAGNOSIS — R7303 Prediabetes: Secondary | ICD-10-CM | POA: Insufficient documentation

## 2023-10-25 NOTE — Progress Notes (Signed)
 Patient was seen on 10/25/23 for the Core Session 9 of Diabetes Prevention Program course at [facility]. By the end of this session patients are able to complete the following objectives:   Learning Objectives: List and describe five steps to problem solving.  Apply the five problem solving steps to resolve a problem he or she has with eating less fat and fewer calories or being more active.   Goals:  Record weight taken outside of class.  Track foods and beverages eaten each day in the "Food and Activity Tracker," including calories and fat grams for each item.   Track activity type, minutes you were active, and distance you reached each day in the "Food and Activity Tracker."  Set aside one 20 to 30-minute block of time every day or find two or more periods of 10 to15 minutes each for physical activity.  Use problem solving action plan created during session to problem solve.   Follow-Up Plan: Attend Core Session 10 next week.  Bring completed "Food and Activity Tracker" next week to be reviewed by Lifestyle Coach. Bring menus from favorite restaurants to next session for future discussion.

## 2023-11-15 ENCOUNTER — Encounter: Payer: Self-pay | Admitting: Dietician

## 2023-11-15 ENCOUNTER — Encounter (HOSPITAL_BASED_OUTPATIENT_CLINIC_OR_DEPARTMENT_OTHER): Payer: 59 | Admitting: Dietician

## 2023-11-15 DIAGNOSIS — R7303 Prediabetes: Secondary | ICD-10-CM

## 2023-11-15 NOTE — Progress Notes (Signed)
 Patient was seen on 11/15/23 for the Core Session 10 of Diabetes Prevention Program course at [facility]. By the end of this session patients are able to complete the following objectives:   Learning Objectives: List and describe the four keys for healthy eating out.  Give examples of how to apply these keys at the type of restaurants that the participants go to regularly.  Make an appropriate meal selection from a restaurant menu.  Demonstrate how to ask for a substitute item using assertive language and a polite tone of voice.    Goals:  Record weight taken outside of class.  Track foods and beverages eaten each day in the "Food and Activity Tracker," including calories and fat grams for each item.   Track activity type, minutes you were active, and distance you reached each day in the "Food and Activity Tracker."  Set aside one 20 to 30-minute block of time every day or find two or more periods of 10 to15 minutes each for physical activity.  Utilize positive action plan and complete questions on "To Do List."   Follow-Up Plan: Attend Core Session 11 next week.  Bring completed "Food and Activity Tracker" next week to be reviewed by Lifestyle Coach.

## 2023-11-22 ENCOUNTER — Encounter: Payer: 59 | Attending: Family Medicine | Admitting: Dietician

## 2023-11-22 ENCOUNTER — Encounter: Payer: Self-pay | Admitting: Dietician

## 2023-11-22 DIAGNOSIS — R7303 Prediabetes: Secondary | ICD-10-CM | POA: Insufficient documentation

## 2023-11-22 NOTE — Progress Notes (Signed)
 On 11/22/23 completed Session 11 of Diabetes Prevention Program course  with [facility]. By the end of this session patients are able to complete the following objectives:   Learning Objectives: Give examples of negative thoughts that could prevent them from meeting their goals of losing weight and being more physically active.  Describe how to stop negative thoughts and talk back to them with positive thoughts.  Practice 1) stopping negative thoughts and 2) talking back to negative thoughts with positive ones.    Goals:  Record weight taken outside of class.  Track foods and beverages eaten each day in the "Food and Activity Tracker," including calories and fat grams for each item.   Track activity type, minutes you were active, and distance you reached each day in the "Food and Activity Tracker."  If you have any negative thoughts-write them in your Food and Activity Trackers, along with how you talked back to them. Practice stopping negative thoughts and talking back to them with positive thoughts.   Follow-Up Plan: Attend Core Session 12 next week.  Bring completed "Food and Activity Tracker" next week to be reviewed by Lifestyle Coach.

## 2023-11-29 ENCOUNTER — Encounter (HOSPITAL_BASED_OUTPATIENT_CLINIC_OR_DEPARTMENT_OTHER): Payer: 59 | Admitting: Dietician

## 2023-11-29 ENCOUNTER — Encounter: Payer: Self-pay | Admitting: Dietician

## 2023-11-29 DIAGNOSIS — R7303 Prediabetes: Secondary | ICD-10-CM

## 2023-11-29 NOTE — Progress Notes (Signed)
 Patient was seen on 11/29/23 for the Core Session 12 of Diabetes Prevention Program course at [facility]. By the end of this session patients are able to complete the following objectives:   Learning Objectives: Describe their current progress toward defined goals. Describe common causes for slipping from healthy eating or being active. Explain what to do to get back on their feet after a slip.  Goals:  Record weight taken outside of class.  Track foods and beverages eaten each day in the "Food and Activity Tracker," including calories and fat grams for each item.   Track activity type, minutes active, and distance reached each day in the "Food and Activity Tracker."  Try out the two action plans created during session- "Slips from Healthy Eating: Action Plan" and "Slips from Being Active: Action Plan" Answer questions on the handout.   Follow-Up Plan: Attend Core Session 13 next week.  Bring completed "Food and Activity Tracker" next week to be reviewed by Lifestyle Coach.

## 2023-12-13 ENCOUNTER — Encounter (HOSPITAL_BASED_OUTPATIENT_CLINIC_OR_DEPARTMENT_OTHER): Payer: 59 | Admitting: Dietician

## 2023-12-13 ENCOUNTER — Encounter: Payer: Self-pay | Admitting: Dietician

## 2023-12-13 DIAGNOSIS — R7303 Prediabetes: Secondary | ICD-10-CM

## 2023-12-13 NOTE — Progress Notes (Signed)
 Patient was seen on 12/13/23 for the Core Session 13 of Diabetes Prevention Program course at [facility]. By the end of this session patients are able to complete the following objectives:   Learning Objectives: Describe ways to add interest and variety to their activity plans. Define ?aerobic fitness. Explain the four F.I.T.T. principles (frequency, intensity, time, and type of activity) and how they relate to aerobic fitness.   Goals:  Record weight taken outside of class.  Track foods and beverages eaten each day in the "Food and Activity Tracker," including calories and fat grams for each item.   Track activity type, minutes you were active, and distance you reached each day in the "Food and Activity Tracker."  Do your best to reach activity goal for the week. Use one of the F.I.T.T. principles to jump start workouts. Document activity level on the "To Do Next Week" handout.  Follow-Up Plan: Attend Core Session 14 next week.  Bring completed "Food and Activity Tracker" next week to be reviewed by Lifestyle Coach.

## 2023-12-27 ENCOUNTER — Encounter: Payer: 59 | Attending: Family Medicine | Admitting: Dietician

## 2023-12-27 ENCOUNTER — Encounter: Payer: Self-pay | Admitting: Dietician

## 2023-12-27 DIAGNOSIS — R7303 Prediabetes: Secondary | ICD-10-CM | POA: Diagnosis not present

## 2023-12-27 NOTE — Progress Notes (Signed)
 Patient was seen on 12/27/23 for the Core Session 14 of Diabetes Prevention Program course at [facility]. By the end of this session patients are able to complete the following objectives:   Learning Objectives: Give examples of problem social cues and helpful social cues.  Explain how to remove problem social cues and add helpful ones.  Describe ways of coping with vacations and social events such as parties, holidays, and visits from relatives and friends.  Create an action plan to change a problem social cue and add a helpful one.   Goals:  Record weight taken outside of class.  Track foods and beverages eaten each day in the "Food and Activity Tracker," including calories and fat grams for each item.   Track activity type, minutes you were active, and distance you reached each day in the "Food and Activity Tracker."  Do your best to reach activity goal for the week. Use action plan created during session to change a problem social cue and add a helpful social cue.  Answer questions regarding success of changing social cues on "To Do Next Week" handout.   Follow-Up Plan: Attend Core Session 15 next week.  Bring completed "Food and Activity Tracker" next week to be reviewed by Lifestyle Coach.

## 2024-01-03 ENCOUNTER — Encounter: Payer: 59 | Attending: Family Medicine | Admitting: Dietician

## 2024-01-03 ENCOUNTER — Encounter: Payer: Self-pay | Admitting: Dietician

## 2024-01-03 DIAGNOSIS — R7303 Prediabetes: Secondary | ICD-10-CM | POA: Insufficient documentation

## 2024-01-03 NOTE — Progress Notes (Signed)
 Patient was seen on 01/03/24 for the Core Session 15 of Diabetes Prevention Program course at [facility]. By the end of this session patients are able to complete the following objectives:   Learning Objectives: Explain how to prevent stress or cope with unavoidable stress.  Describe how this program can be a source of stress.  Explain how to manage stressful situations.  Create and follow an action plan for either preventing or coping with a stressful situation.   Goals:  Record weight taken outside of class.  Track foods and beverages eaten each day in the Food and Activity Tracker, including calories and fat grams for each item.   Track activity type, minutes you were active, and distance you reached each day in the Food and Activity Tracker.  Do your best to reach activity goal for the week. Follow your action plan to reduce stress.  Answer questions on handout regarding success of action plan.   Follow-Up Plan: Attend Core Session 16 next week.  Bring completed Food and Activity Tracker next week to be reviewed by Lifestyle Coach.

## 2024-01-17 ENCOUNTER — Encounter: Payer: 59 | Attending: Family Medicine | Admitting: Dietician

## 2024-01-17 ENCOUNTER — Encounter: Payer: Self-pay | Admitting: Dietician

## 2024-01-17 DIAGNOSIS — R7303 Prediabetes: Secondary | ICD-10-CM | POA: Insufficient documentation

## 2024-01-17 NOTE — Progress Notes (Signed)
 Patient was seen on 01/17/24 for the Core Session 16 of Diabetes Prevention Program course at [facility]. By the end of this session patients are able to complete the following objectives:   Learning Objectives: Measure their progress toward weight and physical activity goals since Session 1.  Develop a plan for improving progress, if their goals have not yet been attained.  Describe ways to stay motivated long-term.   Goals:  Record weight taken outside of class.  Track foods and beverages eaten each day in the Food and Activity Tracker, including calories and fat grams for each item.   Track activity type, minutes you were active, and distance you reached each day in the Food and Activity Tracker.  Utilize action plan to help stay motivated and complete questions on To Do List.   Follow-Up Plan: Attend session 17 in two weeks.  Bring completed Food and Activity Tracker next session to be reviewed by Lifestyle Coach.

## 2024-01-31 ENCOUNTER — Encounter: Payer: Self-pay | Admitting: Dietician

## 2024-01-31 ENCOUNTER — Encounter: Attending: Family Medicine | Admitting: Dietician

## 2024-01-31 DIAGNOSIS — R7303 Prediabetes: Secondary | ICD-10-CM | POA: Diagnosis not present

## 2024-01-31 NOTE — Progress Notes (Signed)
 Patient was seen on 01/31/24 for Session 17 of Diabetes Prevention Program course at [facility]. By the end of this session patients are able to complete the following objectives:   Learning Objectives: Identify how to maintain and/or continue working toward program goals for the remainder of the program.  Describe ways that food and activity tracking can assist them in maintaining/reaching program goals.  Identify progress they have made since the beginning of the program.   Goals:  Record weight taken outside of class.  Track foods and beverages eaten each day in the Food and Activity Tracker, including calories and fat grams for each item.   Track activity type, minutes you were active, and distance you reached each day in the Food and Activity Tracker.   Follow-Up Plan: Attend session 18 in two weeks.  Bring completed Food and Activity Trackers next session to be reviewed by Lifestyle Coach.

## 2024-02-05 ENCOUNTER — Other Ambulatory Visit (HOSPITAL_BASED_OUTPATIENT_CLINIC_OR_DEPARTMENT_OTHER): Payer: Self-pay | Admitting: Family Medicine

## 2024-02-05 DIAGNOSIS — Z711 Person with feared health complaint in whom no diagnosis is made: Secondary | ICD-10-CM

## 2024-02-07 ENCOUNTER — Ambulatory Visit (HOSPITAL_BASED_OUTPATIENT_CLINIC_OR_DEPARTMENT_OTHER)
Admission: RE | Admit: 2024-02-07 | Discharge: 2024-02-07 | Disposition: A | Discharge status: Home / Self Care | Source: Ambulatory Visit | Attending: Family Medicine | Admitting: Family Medicine

## 2024-02-07 DIAGNOSIS — Z711 Person with feared health complaint in whom no diagnosis is made: Secondary | ICD-10-CM | POA: Insufficient documentation

## 2024-02-07 MED ORDER — GADOBUTROL 1 MMOL/ML IV SOLN
7.5000 mL | Freq: Once | INTRAVENOUS | Status: AC | PRN
  Administered 2024-02-07: 7.5 mL via INTRAVENOUS
  Filled 2024-02-07: qty 7.5

## 2024-02-28 ENCOUNTER — Encounter: Payer: Self-pay | Admitting: Dietician

## 2024-02-28 ENCOUNTER — Encounter: Attending: Family Medicine | Admitting: Dietician

## 2024-02-28 DIAGNOSIS — R7303 Prediabetes: Secondary | ICD-10-CM | POA: Diagnosis not present

## 2024-02-28 NOTE — Progress Notes (Signed)
 On 02/28/24 patient completed a post core session of Diabetes Prevention Program course with [facility]. By the end of this session patients are able to complete the following objectives:    Learning Objectives: Explain how glucose is used in the body and it's relationship with insulin/insulin resistance.  Identify symptoms of diabetes.  Describe lab tests used to diagnose diabetes.  Describe health complications and conditions related to diabetes.   Goals:  Record weight taken outside of class.  Track foods and beverages eaten each day in the Food and Activity Tracker, including calories and fat grams for each item.   Track activity type, minutes you were active, and distance you reached each day in the Food and Activity Tracker.   Follow-Up Plan: Attend next session.  Email completed Food and Activity Trackers before next session to be reviewed by Lifestyle Coach.

## 2024-04-03 ENCOUNTER — Encounter: Payer: Self-pay | Admitting: Dietician

## 2024-04-03 ENCOUNTER — Encounter: Attending: Family Medicine | Admitting: Dietician

## 2024-04-03 DIAGNOSIS — R7303 Prediabetes: Secondary | ICD-10-CM | POA: Insufficient documentation

## 2024-04-03 NOTE — Progress Notes (Signed)
 Patient was seen on 04/03/24 for the Diabetes Prevention Program course at [facility]. By the end of this session patients are able to complete the following objectives:   Learning Objectives: Describe the differences between unsaturated, saturated, and trans fat on heart health.  List dietary sources of unsaturated, saturated, and trans fats. Explain ways to reduce intake of saturated fat and replace them with heart healthy fats.  Goals:  Record weight taken outside of class.  Track foods and beverages eaten each day in the Food and Activity Tracker, including calories and fat grams for each item.   Track activity type, minutes you were active, and distance you reached each day in the Food and Activity Tracker.   Follow-Up Plan: Attend next session.  Bring completed Food and Activity Trackers to next session to be reviewed by Lifestyle Coach.

## 2024-04-17 ENCOUNTER — Other Ambulatory Visit (HOSPITAL_BASED_OUTPATIENT_CLINIC_OR_DEPARTMENT_OTHER): Payer: Self-pay

## 2024-04-17 MED ORDER — FLUZONE HIGH-DOSE 0.5 ML IM SUSY
0.5000 mL | PREFILLED_SYRINGE | Freq: Once | INTRAMUSCULAR | 0 refills | Status: AC
  Filled 2024-04-17: qty 0.5, 1d supply, fill #0

## 2024-05-01 ENCOUNTER — Encounter: Payer: Self-pay | Admitting: Dietician

## 2024-05-01 ENCOUNTER — Encounter: Attending: Family Medicine | Admitting: Dietician

## 2024-05-01 DIAGNOSIS — R7303 Prediabetes: Secondary | ICD-10-CM | POA: Insufficient documentation

## 2024-05-01 NOTE — Progress Notes (Signed)
 Patient was seen on 05/01/24 for the Diabetes Prevention Program course at [facility]. By the end of this session patients are able to complete the following objectives:   Learning Objectives: List risk factors for heart disease.  Define the difference between HDL and LDL cholesterol List ways to reduce risk for heart disease.   Goals:  Record weight taken outside of class.  Track foods and beverages eaten each day in the Food and Activity Tracker, including calories and fat grams for each item.   Track activity type, minutes you were active, and distance you reached each day in the Food and Activity Tracker.   Follow-Up Plan: Attend next session.  Bring completed Food and Activity Trackers to next session to be reviewed by Lifestyle Coach.

## 2024-05-21 ENCOUNTER — Other Ambulatory Visit: Payer: Self-pay

## 2024-05-21 DIAGNOSIS — R634 Abnormal weight loss: Secondary | ICD-10-CM

## 2024-05-28 ENCOUNTER — Inpatient Hospital Stay: Admission: RE | Admit: 2024-05-28 | Discharge: 2024-05-28 | Disposition: A | Source: Ambulatory Visit

## 2024-05-28 DIAGNOSIS — R634 Abnormal weight loss: Secondary | ICD-10-CM

## 2024-05-28 MED ORDER — IOPAMIDOL (ISOVUE-370) INJECTION 76%
75.0000 mL | Freq: Once | INTRAVENOUS | Status: AC | PRN
  Administered 2024-05-28: 75 mL via INTRAVENOUS

## 2024-06-05 ENCOUNTER — Encounter: Payer: Self-pay | Admitting: Dietician

## 2024-06-05 ENCOUNTER — Encounter: Attending: Family Medicine | Admitting: Dietician

## 2024-06-05 DIAGNOSIS — R7303 Prediabetes: Secondary | ICD-10-CM | POA: Diagnosis not present

## 2024-06-05 NOTE — Progress Notes (Signed)
 Start time: 1530 End time: 1630 Class size: 5  On 06/05/24 patient completed a post core session of the Diabetes Prevention Program course with [facility]. By the end of this session patients are able to complete the following objectives:   Learning Objectives: Describe the importance of having regular meals each day and how skipping meals can negatively affect food choices and weight.  Plan out balanced meals and snacks. List ways to avoid unplanned snacking.   Goals:  Record weight taken outside of class.  Track foods and beverages eaten each day in the Food and Activity Tracker, including calories and fat grams for each item.   Track activity type, minutes you were active, and distance you reached each day in the Food and Activity Tracker.   Follow-Up Plan: Attend next session.  Email completed Food and Activity Trackers before next session to be reviewed by Lifestyle Coach.

## 2024-06-08 ENCOUNTER — Other Ambulatory Visit: Payer: Self-pay

## 2024-06-08 DIAGNOSIS — R935 Abnormal findings on diagnostic imaging of other abdominal regions, including retroperitoneum: Secondary | ICD-10-CM

## 2024-06-15 ENCOUNTER — Encounter: Payer: Self-pay | Admitting: Gastroenterology

## 2024-06-16 ENCOUNTER — Ambulatory Visit
Admission: RE | Admit: 2024-06-16 | Discharge: 2024-06-16 | Disposition: A | Discharge status: Home / Self Care | Source: Ambulatory Visit

## 2024-06-16 DIAGNOSIS — R935 Abnormal findings on diagnostic imaging of other abdominal regions, including retroperitoneum: Secondary | ICD-10-CM

## 2024-06-16 NOTE — Progress Notes (Unsigned)
 [facility]  [phone]   HEMATOLOGY ONCOLOGY CONSULTATION   Lisa White  DOB: 1936/05/25  MR#: [id number]  CSN#: [id number]    Requesting Physician: Leonette Rad, PA - [facility]  Patient Care Team: Aisha Harvey, MD as PCP - General (Family Medicine) Ardis, Evalene CROME, RN as Oncology Nurse Navigator  Reason for consult: rectal cancer   History of present illness:   the patient has medical history of hypertension and high cholesterol. She had appointment with her primary care provider due to seeing blood in her stool when wiping after bowel movement. She had never experienced this before. She had not had screening colonoscopy. She was referred to GI. She initially had CT of her abdomen and pelvis on 05/28/2024. This showed rectal wall thickening, sigmoid diverticulosis, and area of fluid in the endometrial cavity. She underwent flex sigmoidoscopy on 06/10/2024. There was a polypoid, fungating, sessile, frond-like/villous, and infiltrative, partially obstructing, large mass, 10 to 15 cm proximal to the anus in the recto-sigmoid colon. The mass was partially circumferential (involving 2/3 of the lumen circumference. The mass measured 5 cm in length. No bleeding was noted. Biopsy was taken. 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  Today, the patient presents with her husband. She states that she feels tired sometimes. She states that she notices blood in her stool intermittently. This is only present after bowel movements when she is wiping. This doesn't happen every time, only on occasion. She denies constipation or diarrhea. She denies nausea or vomiting. She states that she has mildly decreased appetite and has had a gradual weight loss over the past year. This has been about 20 pounds. She denies fever, chills, night sweats, or joint pain. She denies chest pain, chest pressure, or shortness of breath. She denies pain. Socially, the  patient is married.  She and her husband live in [place].  Their adult son lives with them.  She is retired.  She initially worked for [employer] in [place] and then worked for [employer] for 15 years.  She does not smoke.  She rarely drinks alcohol.  She does not use any medications or drugs that are not prescribed.  MEDICAL HISTORY:  Past Medical History:  Diagnosis Date   Arthritis    Hyperlipidemia    Hypertension     SURGICAL HISTORY: Past Surgical History:  Procedure Laterality Date   APPENDECTOMY     FEMUR IM NAIL Right 02/18/2015   Procedure: INTRAMEDULLARY (IM) NAIL FEMORAL;  Surgeon: Donnice Car, MD;  Location: [facility];  Service: Orthopedics;  Laterality: Right;   ORIF FEMUR FRACTURE Left 09/17/2013   Procedure: OPEN REDUCTION INTERNAL FIXATION (ORIF) DISTAL FEMUR FRACTURE;  Surgeon: Donnice JONETTA Car, MD;  Location: [facility];  Service: Orthopedics;  Laterality: Left;   TONSILLECTOMY      SOCIAL HISTORY: Social History   Socioeconomic History   Marital status: Married    Spouse name: Not on file   Number of children: Not on file   Years of education: Not on file   Highest education level: Not on file  Occupational History   Not on file  Tobacco Use   Smoking status: Never   Smokeless tobacco: Never  Substance and Sexual Activity   Alcohol use: No   Drug use: No   Sexual activity: Yes  Other Topics Concern   Not on file  Social History Narrative   Not on file   Social Drivers of Health   Financial  Resource Strain: Not on file  Food Insecurity: No Food Insecurity (06/17/2024)   Hunger Vital Sign    Worried About Running Out of Food in the Last Year: Never true    Ran Out of Food in the Last Year: Never true  Transportation Needs: No Transportation Needs (06/17/2024)   PRAPARE - Administrator, Civil Service (Medical): No    Lack of Transportation (Non-Medical): No  Physical Activity: Not on file  Stress: Not on file  Social Connections:  Not on file  Intimate Partner Violence: Not At Risk (06/17/2024)   Humiliation, Afraid, Rape, and Kick questionnaire    Fear of Current or Ex-Partner: No    Emotionally Abused: No    Physically Abused: No    Sexually Abused: No    FAMILY HISTORY: Family History  Problem Relation Age of Onset   Stroke Mother    Stroke Father    Breast cancer Sister    Heart disease Mother    Heart disease Father     ALLERGIES:  has no known allergies.  MEDICATIONS:  Current Outpatient Medications  Medication Sig Dispense Refill   acetaminophen  (TYLENOL ) 325 MG tablet Take 2 tablets (650 mg total) by mouth every 6 (six) hours as needed for mild pain (or Fever >/= 101).     alum & mag hydroxide-simeth (MAALOX/MYLANTA) 200-200-20 MG/5ML suspension Take 30 mLs by mouth every 4 (four) hours as needed for indigestion. 355 mL 0   amLODipine  (NORVASC ) 10 MG tablet Take 10 mg by mouth at bedtime. For HTN     aspirin  EC 325 MG EC tablet Take 1 tablet (325 mg total) by mouth daily with breakfast. 30 tablet 0   atorvastatin  (LIPITOR) 20 MG tablet Take 20 mg by mouth at bedtime. For hyperlipidemia     calcium -vitamin D  (OSCAL WITH D) 500-200 MG-UNIT per tablet Take 1 tablet by mouth daily with breakfast.     cholecalciferol  (VITAMIN D ) 1000 UNITS tablet Take 1,000 Units by mouth daily.     docusate sodium  (COLACE) 100 MG capsule Take 1 capsule (100 mg total) by mouth 2 (two) times daily. 10 capsule 0   methocarbamol  (ROBAXIN ) 500 MG tablet Take 1 tablet by mouth 3 (three) times daily as needed. spasms  0   naproxen sodium (ALEVE) 220 MG tablet Take 220 mg by mouth daily as needed.     oxyCODONE -acetaminophen  (PERCOCET/ROXICET) 5-325 MG per tablet Take 1-2 tablets by mouth every 4 (four) hours as needed for moderate pain. 60 tablet 0   PRESCRIPTION MEDICATION Apply 1 application topically as needed (eczema on the ear).     No current facility-administered medications for this visit.    REVIEW OF SYSTEMS:    Constitutional: Denies fevers, chills or abnormal night sweats Eyes: Denies blurriness of vision, double vision or watery eyes Ears, nose, mouth, throat, and face: Denies mucositis or sore throat Respiratory: Denies cough, dyspnea or wheezes Cardiovascular: Denies palpitation, chest discomfort or lower extremity swelling Gastrointestinal:  Denies nausea, heartburn or change in bowel habits Skin: Denies abnormal skin rashes Lymphatics: Denies new lymphadenopathy or easy bruising Neurological:Denies numbness, tingling or new weaknesses Behavioral/Psych: Mood is stable, no new changes  All other systems were reviewed with the patient and are negative.  PHYSICAL EXAMINATION: ECOG PERFORMANCE STATUS: 1 - Symptomatic but completely ambulatory  Vitals:   06/17/24 1118 06/17/24 1119  BP: (!) 161/60 (!) 153/80  Pulse: 76   Resp: 16   Temp: 97.8 F (36.6 C)  SpO2: 96%    Filed Weights   06/17/24 1118  Weight: 149 lb 3.2 oz (67.7 kg)    GENERAL:alert, no distress and comfortable SKIN: skin color, texture, turgor are normal, no rashes or significant lesions EYES: normal, conjunctiva are pink and non-injected, sclera clear OROPHARYNX:no exudate, no erythema and lips, buccal mucosa, and tongue normal  NECK: supple, thyroid normal size, non-tender, without nodularity LYMPH:  no palpable lymphadenopathy in the cervical, axillary or inguinal LUNGS: clear to auscultation and percussion with normal breathing effort HEART: regular rate & rhythm and no murmurs and no lower extremity edema ABDOMEN:abdomen soft, non-tender and normal bowel sounds Musculoskeletal:no cyanosis of digits and no clubbing  PSYCH: alert & oriented x 3 with fluent speech NEURO: no focal motor/sensory deficits  LABORATORY DATA:  I have reviewed the data as listed Lab Results  Component Value Date   WBC 7.4 06/17/2024   HGB 14.3 06/17/2024   HCT 42.9 06/17/2024   MCV 92.3 06/17/2024   PLT 265 06/17/2024    Recent Labs    06/17/24 1157  NA 143  K 4.6  CL 107  CO2 27  GLUCOSE 101*  BUN 18  CREATININE 1.04*  CALCIUM  9.3  GFRNONAA 52*  PROT 7.3  ALBUMIN 4.2  AST 20  ALT 12  ALKPHOS 65  BILITOT 0.6    RADIOGRAPHIC STUDIES: US  PELVIC COMPLETE WITH TRANSVAGINAL Result Date: 06/16/2024 EXAM: PELVIC ULTRASOUND TECHNIQUE: Transvaginal pelvic duplex ultrasound using B-mode/gray scaled imaging with Doppler spectral analysis and color flow was obtained. COMPARISON: CT of the abdomen and pelvis dated 05/28/2024 CLINICAL HISTORY: ABNORMAL CT OF THE ABDOMEN, ABNORMAL FINDING OF ENDOMETRIUM ON CT, Central low attenuation within the endometrial cavity may represent endometrial neoplasm or hydrometros. FINDINGS: ULTRASOUND FINDINGS: UTERUS: Uterus measures approximately 6.2 x 4.8 x 3.0 cm, with an estimated volume of 47 ml. Uterus demonstrates normal myometrial echotexture. ENDOMETRIAL STRIPE: The endometrial stripe measures approximately 2.2 mm. There is fluid within the endometrial cavity, which appears complex with low-level internal echoes. RIGHT OVARY: Neither ovary is demonstrated on either transabdominal or transvaginal imaging. LEFT OVARY: Neither ovary is demonstrated on either transabdominal or transvaginal imaging. FREE FLUID: No free fluid. There are no abnormal adnexal masses or fluid collections demonstrated. IMPRESSION: 1. Complex fluid within the endometrial cavity with low-level internal echoes, correlate clinically and consider gynecology consultation to exclude endometrial pathology such as hydrometra or neoplasm Electronically signed by: Evalene Coho MD 06/16/2024 12:26 PM EST RP Workstation: [id number]   CT ABDOMEN PELVIS W CONTRAST Result Date: 05/30/2024 EXAM: CT ABDOMEN AND PELVIS WITH CONTRAST 05/28/2024 01:09:12 PM TECHNIQUE: CT of the abdomen and pelvis was performed with the administration of intravenous contrast. Multiplanar reformatted images are provided for review.  Automated exposure control, iterative reconstruction, and/or weight-based adjustment of the mA/kV was utilized to reduce the radiation dose to as low as reasonably achievable. CONTRAST: 75mL of Isovue  370. COMPARISON: None available. CLINICAL HISTORY: Unintentional weight loss. FINDINGS: LOWER CHEST: No acute abnormality. LIVER: The liver is unremarkable. GALLBLADDER AND BILE DUCTS: Gallstones are present, but without signs of cholecystitis. SPLEEN: No acute abnormality. PANCREAS: No acute abnormality. ADRENAL GLANDS: No acute abnormality. KIDNEYS, URETERS AND BLADDER: Benign appearing renal cysts are incidentally noted. No stones in the kidneys or ureters. No hydronephrosis. No perinephric or periureteral stranding. Urinary bladder is unremarkable. GI AND BOWEL: Stomach demonstrates no acute abnormality. 4cm Duodenal diverticulum is noted. There is no bowel obstruction. Concentric wall thickening in the mid rectum may be due to underdistention, however  rectal mass cannot be excluded. Sigmoid diverticulosis is noted, without signs of diverticulitis. A tiny fat-containing paraumbilical hernia is present. PERITONEUM AND RETROPERITONEUM: No ascites. No free air. VASCULATURE: Aorta is normal in caliber. LYMPH NODES: No lymphadenopathy. REPRODUCTIVE ORGANS: Central low attenuation in the endometrial cavity of the uterus may represent endometrial thickening or hydrometros. BONES AND SOFT TISSUES: Severe pectus excavatum is noted. Moderate thoracolumbar spine degenerative changes are present. IMPRESSION: 1. Eccentric wall thickening in the mid rectum, which may be due to underdistention, however, a rectal mass cannot be excluded. Consider direct visualization with colonoscopy or sigmoidoscopy. 2. Sigmoid diverticulosis without signs of diverticulitis. 3. Central low attenuation within the endometrial cavity may represent endometrial neoplasm or hydrometros. Recommend pelvic ultrasound for further evaluation. 4. Gallstones  without signs of cholecystitis. Electronically signed by: Norleen Kil MD 05/30/2024 09:15 PM EST RP Workstation: [id number]    ASSESSMENT & PLAN:  Adenocarcinoma of rectum [facility]) Assessment & Plan: The patient has medical history of hypertension and high cholesterol. She had appointment with her primary care provider due to seeing blood in her stool when wiping after bowel movement. She had never experienced this before. She had not had screening colonoscopy. She was referred to GI. She initially had CT of her abdomen and pelvis on 05/28/2024. This showed rectal wall thickening, sigmoid diverticulosis, and area of fluid in the endometrial cavity. She underwent flex sigmoidoscopy on 06/10/2024. There was a polypoid, fungating, sessile, frond-like/villous, and infiltrative, partially obstructing, large mass, 10 to 15 cm proximal to the anus in the recto-sigmoid colon. The mass was partially circumferential (involving 2/3 of the lumen circumference. The mass measured 5 cm in length. No bleeding was noted. Biopsy was taken. 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  Results of biopsy were discussed with the patient and her husband.  They understand that this is a positive diagnosis for colorectal cancer.  Further imaging is needed to have formal staging.  Specifically she needs a pelvic MRI rectal cancer protocol, and CT chest with contrast.  If she has early stage I or II colorectal cancer, upfront surgery is recommended.  She will be referred to colorectal surgeon at [facility] surgery today.  If later disease (stage III or IV), upfront treatment with chemoradiation is recommended.  She understands chemotherapy would consist of oral Xeloda.  At that point we will refer her to radiology oncology for further evaluation and treatment planning.  When asked about goals of treatment, patient states she would like to be treated as aggressively as we feel she can handle safely.  Baseline  CBC/differential, CMP, and CEA were ordered.  She will be referred to social work also.  Orders: -     CBC with Differential (Cancer Center Only); Future -     CEA (Access); Future -     CMP (Cancer Center only); Future -     Ferritin; Future -     CT CHEST W CONTRAST; Future -     MR PELVIS WO CM RECTAL CA STAGING; Future -     Ambulatory referral to Colorectal Surgery    This was a shared visit with Dr. Lanny. All questions were answered. The patient knows to call the clinic with any problems, questions or concerns.      Powell FORBES Lessen, NP 06/17/2024 2:05 PM  Addendum I have seen the patient, examined her. I agree with the assessment and and plan and have edited the notes.   88 year old female with past medical history of  hypertension and dyslipidemia, who was referred for newly diagnosed rectal cancer.  She presented with intermittent rectal bleeding, colonoscopy showed a partially circumferential 5 cm tumor in the mid to upper rectum, biopsy confirmed a moderately differentiated adenocarcinoma, MMR normal.  Staging CT abdomen and pelvis with contrast was negative for nodal or distant metastasis.  Will obtain pelvic MRI with rectal cancer protocol, and CT chest without contrast to complete staging.  Also she is 88 year old, she is still independent and in good health overall, we will refer her to colorectal surgeon.  We discussed role of neoadjuvant chemoradiation for local advanced rectal cancer, she is interested.  I will plan to see her back after the above staging workup, finalize her treatment plan.  All questions were answered.  I spent a total of 45 minutes for her visit today.  Onita Mattock MD 06/18/2024

## 2024-06-17 ENCOUNTER — Inpatient Hospital Stay: Attending: Nurse Practitioner | Admitting: Nurse Practitioner

## 2024-06-17 ENCOUNTER — Inpatient Hospital Stay

## 2024-06-17 VITALS — BP 153/80 | HR 76 | Temp 97.8°F | Resp 16 | Wt 149.2 lb

## 2024-06-17 DIAGNOSIS — C20 Malignant neoplasm of rectum: Secondary | ICD-10-CM

## 2024-06-17 LAB — CBC WITH DIFFERENTIAL (CANCER CENTER ONLY)
Abs Immature Granulocytes: 0.02 K/uL (ref 0.00–0.07)
Basophils Absolute: 0.1 K/uL (ref 0.0–0.1)
Basophils Relative: 1 %
Eosinophils Absolute: 0.2 K/uL (ref 0.0–0.5)
Eosinophils Relative: 3 %
HCT: 42.9 % (ref 36.0–46.0)
Hemoglobin: 14.3 g/dL (ref 12.0–15.0)
Immature Granulocytes: 0 %
Lymphocytes Relative: 23 %
Lymphs Abs: 1.7 K/uL (ref 0.7–4.0)
MCH: 30.8 pg (ref 26.0–34.0)
MCHC: 33.3 g/dL (ref 30.0–36.0)
MCV: 92.3 fL (ref 80.0–100.0)
Monocytes Absolute: 0.6 K/uL (ref 0.1–1.0)
Monocytes Relative: 8 %
Neutro Abs: 4.8 K/uL (ref 1.7–7.7)
Neutrophils Relative %: 65 %
Platelet Count: 265 K/uL (ref 150–400)
RBC: 4.65 MIL/uL (ref 3.87–5.11)
RDW: 13.5 % (ref 11.5–15.5)
WBC Count: 7.4 K/uL (ref 4.0–10.5)
nRBC: 0 % (ref 0.0–0.2)

## 2024-06-17 LAB — CMP (CANCER CENTER ONLY)
ALT: 12 U/L (ref 0–44)
AST: 20 U/L (ref 15–41)
Albumin: 4.2 g/dL (ref 3.5–5.0)
Alkaline Phosphatase: 65 U/L (ref 38–126)
Anion gap: 9 (ref 5–15)
BUN: 18 mg/dL (ref 8–23)
CO2: 27 mmol/L (ref 22–32)
Calcium: 9.3 mg/dL (ref 8.9–10.3)
Chloride: 107 mmol/L (ref 98–111)
Creatinine: 1.04 mg/dL — ABNORMAL HIGH (ref 0.44–1.00)
GFR, Estimated: 52 mL/min — ABNORMAL LOW (ref >60)
Glucose, Bld: 101 mg/dL — ABNORMAL HIGH (ref 70–99)
Potassium: 4.6 mmol/L (ref 3.5–5.1)
Sodium: 143 mmol/L (ref 135–145)
Total Bilirubin: 0.6 mg/dL (ref 0.0–1.2)
Total Protein: 7.3 g/dL (ref 6.5–8.1)

## 2024-06-17 LAB — CEA (ACCESS): CEA (CHCC): 1.67 ng/mL (ref 0.00–5.00)

## 2024-06-17 LAB — FERRITIN: Ferritin: 130 ng/mL (ref 11–307)

## 2024-06-17 NOTE — Progress Notes (Signed)
 PATIENT NAVIGATOR PROGRESS NOTE  Name: Lisa White Date: 06/17/2024 MRN: [id number]  DOB: 1935/08/03   Reason for visit:  Initial Med/Onc visit with Powell Lessen, NP and Dr. Onita Mattock  Comments:  Met with patient during her initial appointment with Powell, NP and Dr. Mattock. Patient was accompanied by her husband. Patient was given [facility] binder with information specific to her diagnosis.  Patient was also given my direct contact information and was instructed to contact office with any questions or concerns after today.    SW referral was entered.   Time spent counseling/coordinating care: > 60 minutes

## 2024-06-17 NOTE — Assessment & Plan Note (Signed)
 The patient has medical history of hypertension and high cholesterol. She had appointment with her primary care provider due to seeing blood in her stool when wiping after bowel movement. She had never experienced this before. She had not had screening colonoscopy. She was referred to GI. She initially had CT of her abdomen and pelvis on 05/28/2024. This showed rectal wall thickening, sigmoid diverticulosis, and area of fluid in the endometrial cavity. She underwent flex sigmoidoscopy on 06/10/2024. There was a polypoid, fungating, sessile, frond-like/villous, and infiltrative, partially obstructing, large mass, 10 to 15 cm proximal to the anus in the recto-sigmoid colon. The mass was partially circumferential (involving 2/3 of the lumen circumference. The mass measured 5 cm in length. No bleeding was noted. Biopsy was taken. 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  Results of biopsy were discussed with the patient and her husband.  They understand that this is a positive diagnosis for colorectal cancer.  Further imaging is needed to have formal staging.  Specifically she needs a pelvic MRI rectal cancer protocol, and CT chest with contrast.  If she has early stage I or II colorectal cancer, upfront surgery is recommended.  She will be referred to colorectal surgeon at [facility] surgery today.  If later disease (stage III or IV), upfront treatment with chemoradiation is recommended.  She understands chemotherapy would consist of oral Xeloda.  At that point we will refer her to radiology oncology for further evaluation and treatment planning.  When asked about goals of treatment, patient states she would like to be treated as aggressively as we feel she can handle safely.  Baseline CBC/differential, CMP, and CEA were ordered.  She will be referred to social work also.

## 2024-06-19 ENCOUNTER — Encounter: Payer: Self-pay | Admitting: Gastroenterology

## 2024-06-22 ENCOUNTER — Other Ambulatory Visit: Payer: Self-pay

## 2024-06-24 ENCOUNTER — Ambulatory Visit (HOSPITAL_COMMUNITY)
Admission: RE | Admit: 2024-06-24 | Discharge: 2024-06-24 | Disposition: A | Discharge status: Home / Self Care | Source: Ambulatory Visit | Attending: Nurse Practitioner | Admitting: Nurse Practitioner

## 2024-06-24 ENCOUNTER — Ambulatory Visit (HOSPITAL_COMMUNITY): Admission: RE | Admit: 2024-06-24 | Discharge: 2024-06-24 | Attending: Nurse Practitioner

## 2024-06-24 DIAGNOSIS — C20 Malignant neoplasm of rectum: Secondary | ICD-10-CM | POA: Insufficient documentation

## 2024-06-24 MED ORDER — IOHEXOL 300 MG/ML  SOLN
75.0000 mL | Freq: Once | INTRAMUSCULAR | Status: AC | PRN
  Administered 2024-06-24: 75 mL via INTRAVENOUS

## 2024-06-25 ENCOUNTER — Ambulatory Visit (HOSPITAL_COMMUNITY): Admission: RE | Admit: 2024-06-25 | Discharge: 2024-06-25 | Attending: Nurse Practitioner

## 2024-06-25 DIAGNOSIS — C20 Malignant neoplasm of rectum: Secondary | ICD-10-CM | POA: Insufficient documentation

## 2024-06-25 NOTE — Progress Notes (Signed)
 PATIENT NAME: Lisa White MRN: [id number] DOB: [birth year] PHYSICIANS:  REFERRING PHYSICIAN:  Saintclair Jasper, MD  CARE TEAM:   Patient Care Team: Aisha Sari ORN, MD as PCP - General (Family Medicine) Sheldon, Elspeth Bitter, MD as Consulting Provider (General Surgery) Saintclair Jasper, MD as Referring Physician (Gastroenterology) Dallie Vera Fine, MD (Gynecology) Lanny Callander, MD as Referring Physician (Hematology and Oncology)  CONSULTING PROVIDER:  ELSPETH BITTER SHELDON, MD     SUBJECTIVE   Chief Complaint:  NEW CANCER    Lisa White is a 88 y.o. female  who is seen today as an office consultation  at the request of DrRONITA Saintclair  for evaluation of rectal cancer  History of Present Illness: Level of Interpreter Services: No interpreter needed (no language barrier)  88 year old female had some abdominal complaints.  Had a CAT scan that raise concern of a rectal mass.  Underwent colonoscopy which raise concern of a mass around 10 cm from the anal verge.  Biopsy consistent with adenocarcinoma.  Consultations requested.  CT chest and pelvis showed no metastatic disease but there was some question of may be an atypical endometrium.  Saw medical oncology.  Due to see gynecology.  Patient uses a cane for balance since she has had hip replacements.  She usually moves her bowels about every day or so.  Not a fiber supplement.  Does not smoke.  Not on blood thinners.  Family history of strokes but not herself.  No history of atrial fibrillation.  Never had any cardiac issues that she is aware of.  No diabetes.  No sleep apnea.  She does not recall any abdominal surgery.  No stress urinary incontinence.     Medical History:  Past Medical History:  Diagnosis Date  . Arthritis     Patient Active Problem List  Diagnosis  . Rectal cancer (CMS/HHS-HCC)    Past Surgical History:  Procedure Laterality Date  . SABRALeg surgery Bilateral      No Known Allergies  Current  Outpatient Medications on File Prior to Visit  Medication Sig Dispense Refill  . amLODIPine  (NORVASC ) 10 MG tablet Take 10 mg by mouth    . calcium  carbonate-vitamin D3 (CALCIUM -VITAMIN D ) 500 mg-5 mcg (200 unit) tablet Take 1 tablet by mouth daily with breakfast    . donepeziL (ARICEPT) 5 MG tablet Take 5 mg by mouth at bedtime    . naproxen sodium (ALEVE) 220 MG tablet Take 220 mg by mouth    . tiZANidine (ZANAFLEX) 4 MG tablet Take 4 mg by mouth 2 (two) times daily     No current facility-administered medications on file prior to visit.    Family History  Problem Relation Age of Onset  . Skin cancer Mother   . High blood pressure (Hypertension) Mother   . Skin cancer Father   . Breast cancer Sister      Social History   Tobacco Use  Smoking Status Never  Smokeless Tobacco Never     Social History   Socioeconomic History  . Marital status: Married  Tobacco Use  . Smoking status: Never  . Smokeless tobacco: Never  Substance and Sexual Activity  . Alcohol use: Never  . Drug use: Never   Social Drivers of Health   Food Insecurity: No Food Insecurity (06/17/2024)   Received from [facility]   Hunger Vital Sign   . Within the past 12 months, you worried that your food would run out before you got the money to buy  more.: Never true   . Within the past 12 months, the food you bought just didn't last and you didn't have money to get more.: Never true  Transportation Needs: No Transportation Needs (06/17/2024)   Received from [facility] - Transportation   . In the past 12 months, has lack of transportation kept you from medical appointments or from getting medications?: No   . In the past 12 months, has lack of transportation kept you from meetings, work, or from getting things needed for daily living?: No  Housing Stability: Unknown (06/25/2024)   Housing Stability Vital Sign   . Homeless in the Last Year: No     ############################################################  Review of Systems: A complete review of systems (ROS) was obtained from the patient.   We have reviewed this information and discussed as appropriate with the patient.   See HPI as well for other pertinent ROS.  Constitutional:  No fevers, chills, sweats.  Weight stable Eyes:  No vision changes, No discharge HENT:  No sore throats, nasal drainage Lymph: No neck swelling, No bruising easily Pulmonary:  No cough, productive sputum CV: No orthopnea, PND . No exertional chest/neck/shoulder/arm pain.  Patient can walk 1/4 mile gradually.    GI: Rectal cancer as noted above.  Otherwise no other personal nor family history of GI/colon cancer, inflammatory bowel disease, irritable bowel syndrome, allergy such as Celiac Sprue, dietary/dairy problems, colitis, ulcers nor gastritis.  No recent sick contacts/gastroenteritis.  No travel outside the country.  No changes in diet.  Renal: No UTIs, No hematuria Genital:  No drainage, bleeding, masses Musculoskeletal: No severe joint pain.  Good ROM major joints Skin:  No sores or lesions Heme/Lymph:  No easy bleeding.  No swollen lymph nodes Neuro:  No active seizures.  No facial droop Psych:  No hallucinations.  No agitation  OBJECTIVE   Vitals:   06/25/24 1347 06/25/24 1355  BP: 138/80   Pulse: (!) 118   Temp: 36.7 C (98.1 F)   SpO2: 98%   Weight: 67.9 kg (149 lb 12.8 oz)   Height: 157.5 cm (5' 2)   PainSc:  0-No pain    Body mass index is 27.4 kg/m.  PHYSICAL EXAM:   Constitutional: Not cachectic.  Hygeine adequate.  Vitals signs as above.   Eyes: Wears glasses - vision corrected,Pupils reactive, normal extraocular movements. Sclera nonicteric Neuro: CN II-XII intact.  No major focal sensory defects.  No major motor deficits. Lymph: No head/neck/groin lymphadenopathy Psych:  No severe agitation.  No severe anxiety.  Judgment & insight Adequate, Oriented x4, HENT:  Normocephalic, Mucus membranes moist.  No thrush.  Hearing: adequate Neck: Supple, No tracheal deviation.  No obvious thyromegaly Chest: No pain to chest wall compression.  Good respiratory excursion.  No audible wheezing CV:  Pulses intact.  regular.  No major extremity edema Ext: No obvious deformity or contracture.  Edema: Not present.  No cyanosis Skin: No major subcutaneous nodules.  Warm and dry Musculoskeletal: Some thoracic kyphoscoliosis.  Severe joint rigidity not present.  No obvious clubbing.  No digital petechiae.  Mobility: no assist device moving easily without restrictions  Abdomen:  Flat Soft.   Nondistended.  Nontender.    Hernia: Not present. Diastasis recti: Not present.  No hepatomegaly.  No splenomegaly.  Genital/Pelvic:  Inguinal hernia: Not present.  Inguinal lymph nodes: without lymphadenopathy nor hidradenitis.   Normal external female genitalia.  Tolerates digital exam.  Mild foul odor but physiologic drainage.  No  purulence or nor blood.  No vaginal wall masses.  Rectovaginal septum normal thickness.  No major hydrocele.  I can feel the cervix it feels smooth and not particularly ulcerated and mobile.  I held off on more aggressive exam  Rectal: Perianal skin Clean with good hygiene  Pruritis ani: Not present Pilonidal disease: Not present  External hemorrhoids: Not present Condyloma / warts: Not present Anal fissure: Not present Perirectal abscess/fistula: Not present Sphincter tone: Normal without anal spasming  Digital and anoscopic rectal exam: Tolerated   Hemorrhoidal piles: Rate 1-2. Prostate:  N/A Rectovaginal septum: Intact with normal thickness & no rectocele Rectal masses: On the posterior rectal wall involving about 30% her circumference is a pedunculated mobile fibrotic mass consistent with the cancer.  I can feel at around 7 cm distally.  Base seems to have about 3 cm diameter.  Some old blood in rectal vault.  No stool.  Other significant  findings:  N/A  PE Chaperone note: Russell Christine, CMA, was included in the room as chaperone for sensitive portions of the exam    ###################################   PE Chaperone note: Russell Christine, CMA, was included in the room as chaperone for sensitive portions of the exam    ###################################################################  Labs, Imaging and Diagnostic Testing:  Located in 'Care Everywhere' section of Epic EMR chart   PRIOR [facility] CL[facility] NOTES:  Not applicable   SURGERY NOTES:  Not applicable   PATHOLOGY:  Not applicable    Assessment and Plan:  DIAGNOSES:  Diagnoses and all orders for this visit:  Rectal cancer (CMS/HHS-HCC)     ASSESSMENT/PLAN  History and physical done with patient and chaperone.  Discussion done with husband and patient  Mid rectal cancer with distal margin around 7 cm by digital exam and MRI.  Staged [id number].  Presented with bleeding.  It is in the mid rectum.  It is mobile but of moderate size.  No evidence of any distant metastatic disease.  She is already seeing medical oncology.  I think she needs to see radiation oncology as well and discussed at tumor board.  Options would include   total neoadjuvant chemotherapy with chemotherapy upfront,  then combination chemo radiation therapy,  then low anterior rectosigmoid resection with either -low colorectal anastomosis and diverting loop ileostomy.  Then loop ileostomy takedown.   OR -permanent and colostomy. In an 88 year old, I do not know how well she would tolerate a low anastomosis.  Hard to say.    2.  OR watchful waiting pathway if she gets a total clinical response with total neoadjuvant chemotherapy.  The challenge will be monitoring this since its at the tip of my finger.  Might be able to follow this digitally versus needing flexible sigmoidoscopies.  Will discuss at the weekly GI tumor board and see what options are possible.  She is 17 but she is  rather active and mentally sharp.  Seems like she wishes to be aggressive.  Should she wish to have surgery would not cardiac clearance given her known performance status.  There is concern of her endometrial cavity being abnormal by CT of pelvis and ultrasound of pelvis..  She is due to see a gynecologist about this.  Defer if she needs to see gynecological oncology if there is an abnormal Pap smear or other evaluation by gynecology visit    FOLLOWUP:  Return for Call with update on your progess in 2 weeks.  I have reviewed this patient's available data, including medical history, doctor  notes, radiology & other studies, events of note, test results, physical exam, etc as part of my evaluation.   A significant portion of that time was spent in counseling in discussion of management, need for further testing, need for other medical or surgical input, etc.   Care was provided by me.  Based on my assessment, this care required MODERATE- Level 4 level of medical decision making.  06/25/2024   The plan was discussed in detail with the patient today, who expressed understanding & appreciation.  The patient has my contact information, and understands to call me with any additional questions or concerns.  I & my group would be happy to see the patient back sooner if the need arises.  Of note, portions of this report may have been transcribed using voice recognition software. Effort was made to ensure accuracy; however, inadvertent computerized transcription errors with sound-a-like substitutions may be present.   Any transcriptional errors that result from this process are unintentional.  ########################################################   Elspeth KYM Schultze, MD, FACS, MASCRS Esophageal, Gastrointestinal & Colorectal Surgery Robotic and Minimally Invasive Surgery  [facility]  [address] [place], [place] [address] [phone] Fax [phone] Main              06/25/2024

## 2024-06-26 ENCOUNTER — Other Ambulatory Visit: Payer: Self-pay

## 2024-06-26 DIAGNOSIS — C20 Malignant neoplasm of rectum: Secondary | ICD-10-CM

## 2024-06-29 ENCOUNTER — Other Ambulatory Visit (HOSPITAL_COMMUNITY): Payer: Self-pay

## 2024-06-29 ENCOUNTER — Telehealth: Payer: Self-pay

## 2024-06-29 ENCOUNTER — Telehealth: Payer: Self-pay | Admitting: Pharmacist

## 2024-06-29 ENCOUNTER — Inpatient Hospital Stay: Admitting: Hematology

## 2024-06-29 ENCOUNTER — Inpatient Hospital Stay

## 2024-06-29 VITALS — BP 134/71 | HR 75 | Temp 98.3°F | Resp 17 | Ht 62.0 in | Wt 151.3 lb

## 2024-06-29 DIAGNOSIS — C20 Malignant neoplasm of rectum: Secondary | ICD-10-CM | POA: Diagnosis present

## 2024-06-29 DIAGNOSIS — Z510 Encounter for antineoplastic radiation therapy: Secondary | ICD-10-CM | POA: Diagnosis present

## 2024-06-29 DIAGNOSIS — C19 Malignant neoplasm of rectosigmoid junction: Secondary | ICD-10-CM | POA: Insufficient documentation

## 2024-06-29 LAB — CBC WITH DIFFERENTIAL (CANCER CENTER ONLY)
Abs Immature Granulocytes: 0.03 K/uL (ref 0.00–0.07)
Basophils Absolute: 0.1 K/uL (ref 0.0–0.1)
Basophils Relative: 1 %
Eosinophils Absolute: 0.2 K/uL (ref 0.0–0.5)
Eosinophils Relative: 2 %
HCT: 42.8 % (ref 36.0–46.0)
Hemoglobin: 14.2 g/dL (ref 12.0–15.0)
Immature Granulocytes: 0 %
Lymphocytes Relative: 20 %
Lymphs Abs: 1.4 K/uL (ref 0.7–4.0)
MCH: 30.5 pg (ref 26.0–34.0)
MCHC: 33.2 g/dL (ref 30.0–36.0)
MCV: 91.8 fL (ref 80.0–100.0)
Monocytes Absolute: 0.5 K/uL (ref 0.1–1.0)
Monocytes Relative: 7 %
Neutro Abs: 5 K/uL (ref 1.7–7.7)
Neutrophils Relative %: 70 %
Platelet Count: 245 K/uL (ref 150–400)
RBC: 4.66 MIL/uL (ref 3.87–5.11)
RDW: 13.2 % (ref 11.5–15.5)
WBC Count: 7.2 K/uL (ref 4.0–10.5)
nRBC: 0 % (ref 0.0–0.2)

## 2024-06-29 LAB — CMP (CANCER CENTER ONLY)
ALT: 12 U/L (ref 0–44)
AST: 21 U/L (ref 15–41)
Albumin: 4.1 g/dL (ref 3.5–5.0)
Alkaline Phosphatase: 68 U/L (ref 38–126)
Anion gap: 11 (ref 5–15)
BUN: 19 mg/dL (ref 8–23)
CO2: 24 mmol/L (ref 22–32)
Calcium: 9.1 mg/dL (ref 8.9–10.3)
Chloride: 106 mmol/L (ref 98–111)
Creatinine: 1.02 mg/dL — ABNORMAL HIGH (ref 0.44–1.00)
GFR, Estimated: 53 mL/min — ABNORMAL LOW (ref >60)
Glucose, Bld: 99 mg/dL (ref 70–99)
Potassium: 4.2 mmol/L (ref 3.5–5.1)
Sodium: 142 mmol/L (ref 135–145)
Total Bilirubin: 0.6 mg/dL (ref 0.0–1.2)
Total Protein: 7.4 g/dL (ref 6.5–8.1)

## 2024-06-29 LAB — FERRITIN: Ferritin: 124 ng/mL (ref 11–307)

## 2024-06-29 LAB — CEA (ACCESS): CEA (CHCC): 1.78 ng/mL (ref 0.00–5.00)

## 2024-06-29 MED ORDER — CAPECITABINE 500 MG PO TABS: ORAL_TABLET | ORAL | 1 refills | Status: DC

## 2024-06-29 NOTE — Assessment & Plan Note (Signed)
 cT3bN0M0 stage IIa, MMR normal, G2 - Diagnosed in November 2025.  Patient presented with intermittent rectal bleeding.  Colonoscopy showed a partially circumferential 5 cm tumor in the mid to upper rectum.  Biopsy confirmed adenocarcinoma -CT staging scans negative for distant metastasis.  Pelvic MRI showed T3 pN0 disease. -Plan to start concurrent chemoradiation, she is not a good candidate for total neoadjuvant due to her advanced age.

## 2024-06-29 NOTE — Telephone Encounter (Signed)
 Oral Oncology Patient Advocate Encounter  After completing a benefits investigation, prior authorization for Capecitabine is not required at this time through [facility].  Patient's copay is $0.00.      Charlott Hamilton,  CPhT-Adv  she/her/hers [facility]  [facility] Pharmacy Technician Patient Advocate Specialist III [facility] Phone: [phone]  Fax: [phone] [email or web address]

## 2024-06-29 NOTE — Progress Notes (Signed)
 PATIENT NAVIGATOR PROGRESS NOTE  Name: Lisa White Date: 06/29/2024 MRN: [id number]  DOB: [birth year]   Reason for visit:  Follow-up with Dr. Lanny  Comments:   Patient was seen during her follow-up visit with Dr. Lanny.  Patient was given written information about capecitabine .  Patient was informed she would be getting a phone call to scheduled her for a Rad/Onc consult.  Patient was also informed she would be getting a phone call from one of our pharmacists to give her more information regarding the Xeloda .  Patient and her husband verbalized understanding.     Time spent counseling/coordinating care: 45-60 minutes

## 2024-06-29 NOTE — Progress Notes (Signed)
 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  Patient presented with intermittent rectal bleeding.  Colonoscopy showed a partially circumferential 5 cm tumor in the mid to upper rectum.  Biopsy confirmed adenocarcinoma -CT staging scans negative for distant metastasis.  Pelvic MRI showed T3 pN0 disease. -Plan to start concurrent chemoradiation, she is not a good candidate for total neoadjuvant due to her advanced age.  Assessment & Plan Stage II (T3N0M0) rectal cancer with associated rectal bleeding She has newly diagnosed stage II (T3N0M0) rectal adenocarcinoma, confirmed by MRI and CT, with disease confined to the rectum, no nodal involvement, and no distant metastasis. Ongoing rectal bleeding is attributed to the tumor. Hematologic and renal parameters are within normal limits, and there is no evidence of anemia. Given her advanced age, the treatment plan prioritizes curative intent while considering tolerability and quality of life. Chemoradiation is expected to be tolerable; intravenous chemotherapy is not planned due to anticipated poor tolerability, but may be considered if she desires. The cancer remains potentially curable at this stage. - Referred urgently to radiation oncology for evaluation and initiation of chemoradiation. - Prescribed oral capecitabine  (Xeloda ), three tablets in the morning and three in the evening, to be taken only on days of radiation (Monday through Friday), starting the first day of radiation. - Coordinated with specialty pharmacy for home  delivery of oral chemotherapy. - Instructed to take capecitabine  within 30 minutes after meals, spacing doses 10-12 hours apart. - Provided anticipatory guidance regarding chemoradiation side effects, including fatigue, xerosis, and potential hand-foot syndrome; recommended moisturizing creams (Voltaren gel and udderly smmoth cream) as needed. - Advised to maintain adequate hydration during chemotherapy. - Arranged for oral pharmacist to review side effects and supportive care measures. - Instructed to monitor for cessation of rectal bleeding during treatment, with expected improvement after 2-3 weeks of chemoradiation. - Scheduled follow-up every other week during chemoradiation, with weekly laboratory monitoring. - Discussed that if a complete response to chemoradiation is achieved (no residual tumor on imaging or endoscopy), observation without surgery is possible; if residual tumor persists, surgery will be considered. - Documented that intravenous chemotherapy is not planned due to anticipated poor tolerability, but may be considered if she strongly desires. - Reassured that her current memory medication does not interact with planned oncologic therapy and may be continued.  Plan - I reviewed her staging CT and MRI images and discussed findings with patient and her husband - I recommend concurrent chemoradiation with Xeloda , she is not a great candidate for total neoadjuvant therapy due to her advanced age. - I made urgent referral to rad unk today.  I called in capecitabine  1500 mg twice daily on day of radiation for her, she will start on first day of radiation. - Follow-up on first day of chemoradiation. - Will review her case in GI conference next week.   SUMMARY OF ONCOLOGIC HISTORY: Oncology History  Adenocarcinoma of rectum (HCC)  06/17/2024 Initial Diagnosis   Adenocarcinoma of rectum (HCC)   06/25/2024 Cancer Staging   Staging form: Colon and Rectum, AJCC 8th Edition -  Clinical stage from  06/25/2024: Stage IIA (cT3, cN0, cM0) - Signed by Lanny Callander, MD on 06/29/2024 Total positive nodes: 0 Histologic grade (G): G2 Histologic grading system: 4 grade system      Discussed the use of AI scribe software for clinical note transcription with the patient, who gave verbal consent to proceed.  History of Present Illness Ethleen Lormand is an 88 year old female with newly diagnosed stage II (T3N0M0) rectal cancer who presents for follow-up and discussion of treatment options.  Her stage II (T3N0M0) rectal cancer was confirmed by MRI and CT last week, with disease confined to the rectum and no nodal or distant metastases. She has intermittent rectal bleeding that requires a pad and reports tumor-related fatigue.  She met with a surgical oncologist to review surgery, chemoradiation, and chemotherapy, with risks and benefits considered in the context of her age and treatment tolerability.  She takes nightly medication for short-term memory impairment and has not needed her spasm medication recently. She does not drive and relies on her husband and son for transportation and support, which will enable daily radiation treatments if pursued.     All other systems were reviewed with the patient and are negative.  MEDICAL HISTORY:  Past Medical History:  Diagnosis Date   Arthritis    Hyperlipidemia    Hypertension     SURGICAL HISTORY: Past Surgical History:  Procedure Laterality Date   APPENDECTOMY     FEMUR IM NAIL Right 02/18/2015   Procedure: INTRAMEDULLARY (IM) NAIL FEMORAL;  Surgeon: Donnice Car, MD;  Location: [facility];  Service: Orthopedics;  Laterality: Right;   ORIF FEMUR FRACTURE Left 09/17/2013   Procedure: OPEN REDUCTION INTERNAL FIXATION (ORIF) DISTAL FEMUR FRACTURE;  Surgeon: Donnice JONETTA Car, MD;  Location: [facility];  Service: Orthopedics;  Laterality: Left;   TONSILLECTOMY      I have reviewed the social history and family history with the patient  and they are unchanged from previous note.  ALLERGIES:  has no known allergies.  MEDICATIONS:  Current Outpatient Medications  Medication Sig Dispense Refill   acetaminophen  (TYLENOL ) 325 MG tablet Take 2 tablets (650 mg total) by mouth every 6 (six) hours as needed for mild pain (or Fever >/= 101).     alum & mag hydroxide-simeth (MAALOX/MYLANTA) 200-200-20 MG/5ML suspension Take 30 mLs by mouth every 4 (four) hours as needed for indigestion. 355 mL 0   amLODipine  (NORVASC ) 10 MG tablet Take 10 mg by mouth at bedtime. For HTN     aspirin  EC 325 MG EC tablet Take 1 tablet (325 mg total) by mouth daily with breakfast. 30 tablet 0   atorvastatin  (LIPITOR) 20 MG tablet Take 20 mg by mouth at bedtime. For hyperlipidemia     calcium -vitamin D  (OSCAL WITH D) 500-200 MG-UNIT per tablet Take 1 tablet by mouth daily with breakfast.     capecitabine  (XELODA ) 500 MG tablet Take 3 tabs twice daily on days of radiation, Monday through Friday. Take 30 mins after meal, every 10-12 hours. 90 tablet 1   cholecalciferol  (VITAMIN D ) 1000 UNITS tablet Take 1,000 Units by mouth daily.     docusate sodium  (COLACE) 100 MG capsule Take 1 capsule (100 mg total) by mouth 2 (two) times daily. 10 capsule 0   methocarbamol  (ROBAXIN ) 500 MG tablet Take 1 tablet by mouth 3 (three) times daily as needed. spasms  0   naproxen sodium (ALEVE) 220 MG tablet Take 220 mg by mouth daily as needed.     oxyCODONE -acetaminophen  (PERCOCET/ROXICET) 5-325 MG  per tablet Take 1-2 tablets by mouth every 4 (four) hours as needed for moderate pain. 60 tablet 0   PRESCRIPTION MEDICATION Apply 1 application topically as needed (eczema on the ear).     No current facility-administered medications for this visit.    PHYSICAL EXAMINATION: ECOG PERFORMANCE STATUS: 1 - Symptomatic but completely ambulatory  Vitals:   06/29/24 1200  BP: 134/71  Pulse: 75  Resp: 17  Temp: 98.3 F (36.8 C)  SpO2: 100%   Wt Readings from Last 3 Encounters:   06/29/24 151 lb 4.8 oz (68.6 kg)  06/17/24 149 lb 3.2 oz (67.7 kg)  08/30/23 168 lb (76.2 kg)     GENERAL:alert, no distress and comfortable SKIN: skin color, texture, turgor are normal, no rashes or significant lesions EYES: normal, Conjunctiva are pink and non-injected, sclera clear NECK: supple, thyroid normal size, non-tender, without nodularity LYMPH:  no palpable lymphadenopathy in the cervical, axillary  LUNGS: clear to auscultation and percussion with normal breathing effort HEART: regular rate & rhythm and no murmurs and no lower extremity edema ABDOMEN:abdomen soft, non-tender and normal bowel sounds Musculoskeletal:no cyanosis of digits and no clubbing  NEURO: alert & oriented x 3 with fluent speech, no focal motor/sensory deficits  Physical Exam   LABORATORY DATA:  I have reviewed the data as listed    Latest Ref Rng & Units 06/29/2024   12:12 PM 06/17/2024   11:57 AM 02/19/2015    5:24 AM  CBC  WBC 4.0 - 10.5 K/uL 7.2  7.4  7.3   Hemoglobin 12.0 - 15.0 g/dL 85.7  85.6  87.2   Hematocrit 36.0 - 46.0 % 42.8  42.9  38.6   Platelets 150 - 400 K/uL 245  265  214         Latest Ref Rng & Units 06/29/2024   12:12 PM 06/17/2024   11:57 AM 02/19/2015    4:10 PM  CMP  Glucose 70 - 99 mg/dL 99  898  831   BUN 8 - 23 mg/dL 19  18  20    Creatinine 0.44 - 1.00 mg/dL 8.97  8.95  9.08   Sodium 135 - 145 mmol/L 142  143  138   Potassium 3.5 - 5.1 mmol/L 4.2  4.6  4.4   Chloride 98 - 111 mmol/L 106  107  107   CO2 22 - 32 mmol/L 24  27  25    Calcium  8.9 - 10.3 mg/dL 9.1  9.3  8.3   Total Protein 6.5 - 8.1 g/dL 7.4  7.3    Total Bilirubin 0.0 - 1.2 mg/dL 0.6  0.6    Alkaline Phos 38 - 126 U/L 68  65    AST 15 - 41 U/L 21  20    ALT 0 - 44 U/L 12  12        RADIOGRAPHIC STUDIES: I have personally reviewed the radiological images as listed and agreed with the findings in the report. No results found.    Orders Placed This Encounter  Procedures   Ambulatory  referral to Radiation Oncology    Referral Priority:   Urgent    Referral Type:   Consultation    Referral Reason:   Specialty Services Required    Requested Specialty:   Radiation Oncology    Number of Visits Requested:   1   All questions were answered. The patient knows to call the clinic with any problems, questions or concerns. No barriers to learning was detected. The  total time spent in the appointment was 40 minutes, including review of chart and various tests results, discussions about plan of care and coordination of care plan     Onita Mattock, MD 06/29/2024

## 2024-06-30 ENCOUNTER — Inpatient Hospital Stay: Admitting: Licensed Clinical Social Worker

## 2024-06-30 DIAGNOSIS — C20 Malignant neoplasm of rectum: Secondary | ICD-10-CM

## 2024-06-30 MED ORDER — CAPECITABINE 500 MG PO TABS
ORAL_TABLET | ORAL | 1 refills | Status: DC
  Filled 2024-07-02: qty 60, 21d supply, fill #0
  Filled 2024-07-21 – 2024-07-27 (×4): qty 60, 21d supply, fill #1

## 2024-06-30 NOTE — Progress Notes (Signed)
 [facility] Clinical Social Work  Initial Assessment   Lisa White is a 87 y.o. year old female contacted by phone. Clinical Social Work was referred by medical provider for assessment of psychosocial needs.   SDOH (Social Determinants of Health) assessments performed: Yes   SDOH Screenings   Food Insecurity: No Food Insecurity (06/17/2024)  Housing: Unknown (06/25/2024)   Received from [facility]  Transportation Needs: No Transportation Needs (06/17/2024)  Utilities: Not At Risk (06/17/2024)  Depression (PHQ2-9): Low Risk  (06/29/2024)  Tobacco Use: Low Risk  (06/25/2024)   Received from [facility]    PHQ 2/9:    06/29/2024   12:00 PM 06/17/2024   11:22 AM 06/17/2024   11:21 AM  Depression screen PHQ 2/9  Decreased Interest 0 0 0  Down, Depressed, Hopeless 0 0 0  PHQ - 2 Score 0 0 0     Distress Screen completed: No     No data to display            Family/Social Information:  Housing Arrangement: patient lives with her husband and son, both of which are retired.  Pt is independent in ADLs, utilizing a cane for ambulation as needed.   Family members/support persons in your life? Pt's husband and son will be her primary support, but pt reports she has other family and friends that are able to offer support if needed. Transportation concerns: no  Employment: Retired .  Income source: Actor concerns: No Type of concern: None Food access concerns: no Religious or spiritual practice: Yes-[employer] Advanced directives: Pt unsure if she has completed them when they took care of their will, but made aware of clinics available if not yet completed Services Currently in place:  none  Coping/ Adjustment to diagnosis: Patient understands treatment plan and what happens next? yes Concerns about diagnosis and/or treatment: Overwhelmed by information, Afraid of cancer, and Quality of life Patient reported  stressors: Anxiety/ nervousness and Adjusting to my illness Hopes and/or priorities: pt's priority is to start treatment w/ the hope of positive results Patient enjoys time with family/ friends Current coping skills/ strengths: Capable of independent living , Motivation for treatment/growth , Physical Health , and Supportive family/friends     SUMMARY: Current SDOH Barriers:  No barriers identified at this time.  Clinical Social Work Clinical Goal(s):  No clinical social work goals at this time  Interventions: Discussed common feeling and emotions when being diagnosed with cancer, and the importance of support during treatment Informed patient of the support team roles and support services at [facility] Provided CSW contact information and encouraged patient to call with any questions or concerns    Follow Up Plan: Patient will contact CSW with any support or resource needs Patient verbalizes understanding of plan: Yes    Devere JONELLE Manna, LCSW Clinical Social Worker [facility]

## 2024-06-30 NOTE — Telephone Encounter (Signed)
 Oral Oncology Pharmacist Encounter  Received new prescription for Xeloda  (capecitabine ) for the treatment of stage IIA rectal cancer in conjunction with radiation, planned for duration of radiation therapy.  CBC w/ Diff and CMP from 06/29/24 assessed, patient with Scr of 1.02 mg/dL (CrCl ~57 mL/min). Discussed with Dr. Lanny - due to renal function, will dose reduce patient's Xeloda  25%. Prescription dose and frequency assessed for appropriateness.  Current medication list in Epic reviewed, no relevant/significant DDIs with Xeloda  identified.  Evaluated chart and no patient barriers to medication adherence noted.   Patient agreement for treatment documented in MD note on 06/29/24.  Prescription has been e-scribed to the [facility] for benefits analysis and approval.  Oral Oncology Clinic will continue to follow for insurance authorization, copayment issues, initial counseling and start date.  Asberry Macintosh, PharmD, BCPS, BCOP Hematology/Oncology Clinical Pharmacist [phone] 06/30/2024 8:42 AM

## 2024-07-02 ENCOUNTER — Other Ambulatory Visit: Payer: Self-pay

## 2024-07-02 ENCOUNTER — Telehealth: Payer: Self-pay | Admitting: Radiation Oncology

## 2024-07-02 ENCOUNTER — Ambulatory Visit
Admission: RE | Admit: 2024-07-02 | Discharge: 2024-07-02 | Disposition: A | Discharge status: Home / Self Care | Source: Ambulatory Visit | Attending: Radiation Oncology | Admitting: Radiation Oncology

## 2024-07-02 ENCOUNTER — Encounter: Payer: Self-pay | Admitting: Radiation Oncology

## 2024-07-02 VITALS — Ht 62.0 in | Wt 147.2 lb

## 2024-07-02 VITALS — HR 79 | Temp 97.1°F | Resp 20 | Ht 62.0 in | Wt 147.4 lb

## 2024-07-02 DIAGNOSIS — C20 Malignant neoplasm of rectum: Secondary | ICD-10-CM

## 2024-07-02 NOTE — Progress Notes (Signed)
 Oral Chemotherapy Pharmacist Encounter  Patient was counseled under telephone encounter from 06/29/24.  Asberry Macintosh, PharmD, BCPS, BCOP Hematology/Oncology Clinical Pharmacist [phone] 07/02/2024 1:46 PM

## 2024-07-02 NOTE — Telephone Encounter (Signed)
 Oral Chemotherapy Pharmacist Encounter  I spoke with patient and patient's husband for overview of: Xeloda  (capecitabine ) for the treatment of stage IIA rectal cancer in conjunction with radiation, planned for duration of radiation therapy.   Treatment goal: Control  Counseled patient on administration, dosing, side effects, monitoring, drug-food interactions, safe handling, storage, and disposal.  Patient will take Xeloda  500mg  tablets, 2 tablets (1000mg ) by mouth in AM and 2 tabs (1000mg ) by mouth in PM, within 30 minutes of finishing meals, on days of radiation only.  Xeloda  and radiation start date: 07/13/24  Adverse effects of Xeloda  include but are not limited to: fatigue, decreased blood counts, GI upset, diarrhea, mouth sores, and hand-foot syndrome. Hand-foot syndrome: discussed use of cream such as Udderly Smooth Extra Care 20 or equivalent advanced care cream that has 20% urea content for advanced skin hydration while on Xeloda . Additionally discussed use of OTC Voltaren gel for HFS prophylaxis. Discussed recommended use of Voltaren gel is 1 finger tip application for front/backside of hands and then 1 fingertip application to bottoms of feet twice a day for up to 12 weeks.  Diarrhea: Patient will obtain Imodium (loperamide) to have on hand if they experience diarrhea. Patient knows to alert the office of 4 or more loose stools above baseline.  Reviewed with patient importance of keeping a medication schedule and plan for any missed doses. No barriers to medication adherence identified.  Medication reconciliation performed and medication/allergy list updated.  Distress thermometer flowsheet: Distress thermometer completed during telephone call and reviewed with patient. Due to score, social work referral has not been sent.  Communication and Learning Assessment Primary learner: Patient Barriers to learning: No barriers Preferred language: English Learning preferences: Listening  Reading  All questions answered.  Ms. Desaulniers voiced understanding and appreciation.   Medication education handout placed in mail for patient. Patient knows to call the office with questions or concerns. Oral Chemotherapy Clinic phone number provided to patient.   Asberry Macintosh, PharmD, BCPS, BCOP Hematology/Oncology Clinical Pharmacist [phone] 07/02/2024 1:43 PM

## 2024-07-02 NOTE — Progress Notes (Signed)
 GI Location of Tumor / Histology: Rectum  Lisa White presented with intermittent rectal bleeding. Colonoscopy showed a partially circumferential 5 cm tumor in the mid to upper rectum.   Biopsies of Rectal Mass 06/10/2024   Past/Anticipated interventions by surgeon, if any:   Past/Anticipated interventions by medical oncology, if any:  Dr. Lanny 06/29/2024 - I reviewed her staging CT and MRI images and discussed findings with patient and her husband - I recommend concurrent chemoradiation with Xeloda , she is not a great candidate for total neoadjuvant therapy due to her advanced age. - I made urgent referral to rad onc today.  I called in capecitabine  1500 mg twice daily on day of radiation for her, she will start on first day of radiation. - Follow-up on first day of chemoradiation.   Weight changes, if any: Stable, fluctuates a few pounds.  Bowel/Bladder complaints, if any: Bowels are regular.  Denies bladder changes.  Nausea / Vomiting, if any: No  Pain issues, if any:  No  Any blood per rectum: She notes some smears of blood with wiping and in her underpants.  She reports occasional small clots in the blood.      SAFETY ISSUES: Prior radiation? No Pacemaker/ICD? No Possible current pregnancy? Postmenopausal Is the patient on methotrexate? No  Current Complaints/Details:

## 2024-07-02 NOTE — Telephone Encounter (Signed)
 12/11 Forward outgoing referral to Orvil NOVAK., Physical Therapy - [facility].

## 2024-07-02 NOTE — Progress Notes (Signed)
 Radiation Oncology         ([phone]) [phone] ________________________________  Name: Lisa White        MRN: [id number]  Date of Service: 07/02/2024 DOB: 09/11/1935  RR:FrWzpoo, Sari, MD  Lanny Callander, MD     REFERRING PHYSICIAN: Lanny Callander, MD   DIAGNOSIS: The encounter diagnosis was Adenocarcinoma of rectum [facility]).   HISTORY OF PRESENT ILLNESS: Lisa White is a 88 y.o. female seen at the request of Dr. Lanny for a newly diagnosed rectal cancer. The patient had not had prior screening colonoscopy and was evaluated after noting blood per rectum after a bowel movement. She had a CT abdomen/pelvis on 05/28/24 that showed eccentric wall thickening in the mid rectum , and diverticulosis. There was low attenuation in the endometrial cavity as well. She underwent a flexible sigmoidoscopy with Dr. Saintclair on 06/10/24 and this showed external hemorrhoids and a polypoid fungating mass in the rectum partially obstructing, measuring about 5 cm in length. Biopsies of the rectal mass showed moderately differentiated adenocarcinoma.   A pelvic ultrasound on 06/16/24 showed an endometrial stripe of 2.2 mm, but complex appearing fluid within the endometrial cavity. No other visible masses were appreciated. She had a CEA drawn on 06/17/24 and this was 1.67. She had a CT chest that as negative for metastatic disease. An MRI pelvis on 06/25/24 showed a tumor that measured 4.2 cm  with subtle transrectal extension and measured 4 mm from the mesorectal fascia. The lesion was 9.9 cm from the anal verge, and 7.1 cm from the internal anal sphincter. No enlarged mesorectal or pelvic nodes were present. Her case has been discussed between multiple specialists and she's seen today to discuss chemoradiation for treatment of her cancer.     PREVIOUS RADIATION THERAPY: No   PAST MEDICAL HISTORY:  Past Medical History:  Diagnosis Date   Arthritis    Hyperlipidemia    Hypertension        PAST SURGICAL HISTORY: Past  Surgical History:  Procedure Laterality Date   APPENDECTOMY     FEMUR IM NAIL Right 02/18/2015   Procedure: INTRAMEDULLARY (IM) NAIL FEMORAL;  Surgeon: Donnice Car, MD;  Location: [facility];  Service: Orthopedics;  Laterality: Right;   ORIF FEMUR FRACTURE Left 09/17/2013   Procedure: OPEN REDUCTION INTERNAL FIXATION (ORIF) DISTAL FEMUR FRACTURE;  Surgeon: Donnice JONETTA Car, MD;  Location: [facility];  Service: Orthopedics;  Laterality: Left;   TONSILLECTOMY       FAMILY HISTORY:  Family History  Problem Relation Age of Onset   Stroke Mother    Stroke Father    Breast cancer Sister    Heart disease Mother    Heart disease Father      SOCIAL HISTORY:  reports that she has never smoked. She has never used smokeless tobacco. She reports that she does not drink alcohol and does not use drugs. The patient is married and lives in [place]. Her husband is a know prior patient of ours. The patient does not drive but her husband and adult son help her with getting to medical appointments.   ALLERGIES: Patient has no known allergies.   MEDICATIONS:  Current Outpatient Medications  Medication Sig Dispense Refill   acetaminophen  (TYLENOL ) 325 MG tablet Take 2 tablets (650 mg total) by mouth every 6 (six) hours as needed for mild pain (or Fever >/= 101).     alum & mag hydroxide-simeth (MAALOX/MYLANTA) 200-200-20 MG/5ML suspension Take 30 mLs by mouth every 4 (four) hours as needed for  indigestion. 355 mL 0   amLODipine  (NORVASC ) 10 MG tablet Take 10 mg by mouth at bedtime. For HTN     aspirin  EC 325 MG EC tablet Take 1 tablet (325 mg total) by mouth daily with breakfast. 30 tablet 0   atorvastatin  (LIPITOR) 20 MG tablet Take 20 mg by mouth at bedtime. For hyperlipidemia     calcium -vitamin D  (OSCAL WITH D) 500-200 MG-UNIT per tablet Take 1 tablet by mouth daily with breakfast.     capecitabine  (XELODA ) 500 MG tablet Take 2 tablets (1000 mg total) by mouth 2 (two) times daily on days of radiation,  Monday through Friday. Take within 30 minutes after meals. 60 tablet 1   cholecalciferol  (VITAMIN D ) 1000 UNITS tablet Take 1,000 Units by mouth daily.     docusate sodium  (COLACE) 100 MG capsule Take 1 capsule (100 mg total) by mouth 2 (two) times daily. 10 capsule 0   methocarbamol  (ROBAXIN ) 500 MG tablet Take 1 tablet by mouth 3 (three) times daily as needed. spasms  0   naproxen sodium (ALEVE) 220 MG tablet Take 220 mg by mouth daily as needed.     oxyCODONE -acetaminophen  (PERCOCET/ROXICET) 5-325 MG per tablet Take 1-2 tablets by mouth every 4 (four) hours as needed for moderate pain. 60 tablet 0   PRESCRIPTION MEDICATION Apply 1 application topically as needed (eczema on the ear).     No current facility-administered medications for this visit.     REVIEW OF SYSTEMS: On review of systems, the patient reports that she is doing well overall. She denies any abdominal pain or difficulty with her bowel movements. She has had infrequent blood smears on paper after a bowel movement but no pain with passing stool. She reports intentional weight loss over the year, but overall feels her weight at this time is stable. She denies frank blood per vagina. No other complaints are verbalized.      PHYSICAL EXAM:  Wt Readings from Last 3 Encounters:  06/29/24 151 lb 4.8 oz (68.6 kg)  06/17/24 149 lb 3.2 oz (67.7 kg)  08/30/23 168 lb (76.2 kg)   Temp Readings from Last 3 Encounters:  06/29/24 98.3 F (36.8 C) (Temporal)  06/17/24 97.8 F (36.6 C) (Temporal)  03/01/15 (!) 96.9 F (36.1 C) (Oral)   BP Readings from Last 3 Encounters:  06/29/24 134/71  06/17/24 (!) 153/80  03/01/15 (!) 115/54   Pulse Readings from Last 3 Encounters:  06/29/24 75  06/17/24 76  03/01/15 94    /10  In general this is a well appearing caucasian female in no acute distress. She's alert and oriented x4 and appropriate throughout the examination. Cardiopulmonary assessment is negative for acute distress and she  exhibits normal effort.     ECOG = 1  0 - Asymptomatic (Fully active, able to carry on all predisease activities without restriction)  1 - Symptomatic but completely ambulatory (Restricted in physically strenuous activity but ambulatory and able to carry out work of a light or sedentary nature. For example, light housework, office work)  2 - Symptomatic, <50% in bed during the day (Ambulatory and capable of all self care but unable to carry out any work activities. Up and about more than 50% of waking hours)  3 - Symptomatic, >50% in bed, but not bedbound (Capable of only limited self-care, confined to bed or chair 50% or more of waking hours)  4 - Bedbound (Completely disabled. Cannot carry on any self-care. Totally confined to bed or chair)  5 - Death   Raylene MM, Creech RH, Tormey DC, et al. [phone]). Toxicity and response criteria of the [facility]. Am. DOROTHA Bridges. Oncol. 5 (6): 649-55    LABORATORY DATA:  Lab Results  Component Value Date   WBC 7.2 06/29/2024   HGB 14.2 06/29/2024   HCT 42.8 06/29/2024   MCV 91.8 06/29/2024   PLT 245 06/29/2024   Lab Results  Component Value Date   NA 142 06/29/2024   K 4.2 06/29/2024   CL 106 06/29/2024   CO2 24 06/29/2024   Lab Results  Component Value Date   ALT 12 06/29/2024   AST 21 06/29/2024   ALKPHOS 68 06/29/2024   BILITOT 0.6 06/29/2024      RADIOGRAPHY: MR PELVIS WO CM RECTAL CA STAGING Result Date: 06/25/2024 CLINICAL DATA:  New diagnosis of rectal cancer. EXAM: MRI PELVIS WITHOUT CONTRAST TECHNIQUE: Multiplanar multisequence MR imaging of the pelvis was performed. No intravenous contrast was administered. Ultrasound gel was administered per rectum to optimize tumor evaluation. COMPARISON:  Abdominopelvic CT of 05/28/2024. Clinic note of 06/17/2024 also reviewed. This describes a large mass at 10-15 cm proximal to the anus. 5 cm in length. FINDINGS: TUMOR LOCATION Tumor distance from Anal Verge/Skin  surface: 9.9 cm on image 28/2 Tumor distance to Internal Anal sphincter: 7.1 cm on image 21/4 TUMOR DESCRIPTION Circumferential extent: 360 degrees, including on image 17/7. Tumor Size and volume: 4.2 x 2.7 by 3.9 cm including on images 17/7 in 18/4. Volume of 23.0 cc T - CATEGORY Extension through Muscularis Propria: T3b subtle trans rectal extension is suspected posteriorly on image 17/7 and at the 5 o'clock position on image 19/7. Maximally 3 mm. Shortest Distance of any tumor/node from Mesorectal fascia: 4 mm on image 17/7. Extramural Vascular Invasion/Tumor Thrombus: No Invasion of Anterior Peritoneal Reflection: No Involvement of Adjacent Organs or Pelvic Sidewall: No Levator Ani Involvement: No N - CATEGORY Mesorectal Lymph Nodes >=36mm: None=N0-multiple perirectal nodes of maximally 4 mm are identified including on image 7/3. Extra-mesorectal Lymphadenopathy: No Other: Sigmoid diverticulosis.  No bowel obstruction. Moderate volume endometrial fluid. Example at 1.3 cm on sagittal image 28/2. IMPRESSION: Rectal adenocarcinoma T stage: T3b Rectal adenocarcinoma N stage:  N0 Distance from tumor to the internal anal sphincter is 7.1 cm. Fluid within the endometrium, suboptimally evaluated. Electronically Signed   By: Rockey Kilts M.D.   On: 06/25/2024 13:38   CT Chest W Contrast Result Date: 06/25/2024 CLINICAL DATA:  Rectal cancer staging.  * Tracking Code: BO * EXAM: CT CHEST WITH CONTRAST TECHNIQUE: Multidetector CT imaging of the chest was performed during intravenous contrast administration. RADIATION DOSE REDUCTION: This exam was performed according to the departmental dose-optimization program which includes automated exposure control, adjustment of the mA and/or kV according to patient size and/or use of iterative reconstruction technique. CONTRAST:  75mL OMNIPAQUE  IOHEXOL  300 MG/ML  SOLN COMPARISON:  Abdominopelvic CT 05/28/2024. Chest radiograph 02/18/2015. FINDINGS: Cardiovascular: No acute vascular  findings are demonstrated. There is atherosclerosis of the aorta, great vessels and coronary arteries. There are calcifications of the aortic valve and mild cardiac enlargement. No significant pericardial fluid. Mediastinum/Nodes: There are no enlarged mediastinal, hilar or axillary lymph nodes.Small mediastinal and hilar lymph nodes are likely reactive. The thyroid gland, trachea and esophagus demonstrate no significant findings. Lungs/Pleura: No pleural effusion or pneumothorax. Mild central airway thickening with scattered pulmonary scarring. No confluent airspace disease or suspicious pulmonary nodule. Upper abdomen: Small hiatal hernia. Bilateral renal cysts for which no  specific follow-up imaging is recommended. Duodenal diverticulum noted. Musculoskeletal/Chest wall: There is no chest wall mass or suspicious osseous finding. Thoracolumbar spondylosis noted. IMPRESSION: 1. No evidence of metastatic disease within the chest. 2. Mild central airway thickening and scattered pulmonary scarring. 3. Small hiatal hernia. 4.  Aortic Atherosclerosis (ICD10-I70.0). Electronically Signed   By: Elsie Perone M.D.   On: 06/25/2024 12:04   US  PELVIC COMPLETE WITH TRANSVAGINAL Result Date: 06/16/2024 EXAM: PELVIC ULTRASOUND TECHNIQUE: Transvaginal pelvic duplex ultrasound using B-mode/gray scaled imaging with Doppler spectral analysis and color flow was obtained. COMPARISON: CT of the abdomen and pelvis dated 05/28/2024 CLINICAL HISTORY: ABNORMAL CT OF THE ABDOMEN, ABNORMAL FINDING OF ENDOMETRIUM ON CT, Central low attenuation within the endometrial cavity may represent endometrial neoplasm or hydrometros. FINDINGS: ULTRASOUND FINDINGS: UTERUS: Uterus measures approximately 6.2 x 4.8 x 3.0 cm, with an estimated volume of 47 ml. Uterus demonstrates normal myometrial echotexture. ENDOMETRIAL STRIPE: The endometrial stripe measures approximately 2.2 mm. There is fluid within the endometrial cavity, which appears complex  with low-level internal echoes. RIGHT OVARY: Neither ovary is demonstrated on either transabdominal or transvaginal imaging. LEFT OVARY: Neither ovary is demonstrated on either transabdominal or transvaginal imaging. FREE FLUID: No free fluid. There are no abnormal adnexal masses or fluid collections demonstrated. IMPRESSION: 1. Complex fluid within the endometrial cavity with low-level internal echoes, correlate clinically and consider gynecology consultation to exclude endometrial pathology such as hydrometra or neoplasm Electronically signed by: Evalene Coho MD 06/16/2024 12:26 PM EST RP Workstation: [id number]       IMPRESSION/PLAN: 1. Stage IIA, cT3N0M0, adenocarcinoma of the rectum. Dr. Dewey discusses the pathology findings and reviews the nature of locally advanced cancer of the rectum. Her case has been reviewed in multidisciplinary discussion between medical oncology, radiation, and colorectal surgery.  Recommendations have been made for chemoradiation, followed by consideration of surgical resection versus watch and wait observation. She has met with Dr. Sheldon and he will follow up with her about a month after she would complete chemoradiation.  We discussed the risks, benefits, short, and long term effects of radiotherapy, as well as the curative intent, and the patient is interested in proceeding. Dr. Dewey discusses the delivery and logistics of radiotherapy and anticipates a course of 5 1/2 weeks of radiotherapy. Written consent is obtained and placed in the chart, a copy was provided to the patient. The patient will simulate on 07/07/24. We plan to begin her treatment on 07/13/24. 2. Complex fluid in the endometrial cavity. The patient will proceed with evaluation with Dr. Dallie in GYN and will likely have and endometrial biopsy. We will follow up with these results when available.  3. Risks of pelvic floor dysfunction from radiotherapy. We discussed the importance of evaluation with  physical therapy prior to pelvic radiation. A referral was placed to physical therapy today.    In a visit lasting 60 minutes, greater than 50% of the time was spent face to face discussing the patient's condition, in preparation for the discussion, and coordinating the patient's care.   The above documentation reflects my direct findings during this shared patient visit. Please see the separate note by Dr. Dewey on this date for the remainder of the patient's plan of care.    Donald KYM Husband, [facility]   **Disclaimer: This note was dictated with voice recognition software. Similar sounding words can inadvertently be transcribed and this note may contain transcription errors which may not have been corrected upon publication of note.**

## 2024-07-02 NOTE — Progress Notes (Signed)
 Specialty Pharmacy Initial Fill Coordination Note  Lisa White is a 88 y.o. female contacted today regarding refills of specialty medication(s) Capecitabine  (XELODA ) .  Patient requested [name] at [facility] [place]  on 07/07/24   Medication will be filled on 07/06/2024.   Patient is aware of $0.00 copayment.

## 2024-07-03 ENCOUNTER — Encounter: Admitting: Dietician

## 2024-07-03 ENCOUNTER — Telehealth: Payer: Self-pay | Admitting: Hematology

## 2024-07-03 NOTE — Telephone Encounter (Signed)
 Called  pt and confirm  time and dates.

## 2024-07-06 ENCOUNTER — Other Ambulatory Visit: Payer: Self-pay

## 2024-07-07 ENCOUNTER — Ambulatory Visit: Admitting: Obstetrics and Gynecology

## 2024-07-07 ENCOUNTER — Encounter: Payer: Self-pay | Admitting: Obstetrics and Gynecology

## 2024-07-07 ENCOUNTER — Ambulatory Visit
Admission: RE | Admit: 2024-07-07 | Discharge: 2024-07-07 | Attending: Radiation Oncology | Admitting: Radiation Oncology

## 2024-07-07 VITALS — BP 126/60 | HR 93 | Temp 98.2°F | Ht 62.25 in | Wt 147.0 lb

## 2024-07-07 DIAGNOSIS — Z510 Encounter for antineoplastic radiation therapy: Secondary | ICD-10-CM | POA: Insufficient documentation

## 2024-07-07 DIAGNOSIS — R935 Abnormal findings on diagnostic imaging of other abdominal regions, including retroperitoneum: Secondary | ICD-10-CM

## 2024-07-07 DIAGNOSIS — C20 Malignant neoplasm of rectum: Secondary | ICD-10-CM | POA: Insufficient documentation

## 2024-07-07 MED ORDER — MISOPROSTOL 200 MCG PO TABS: 200.0000 ug | ORAL_TABLET | Freq: Once | ORAL | 0 refills | Status: AC

## 2024-07-07 NOTE — Progress Notes (Unsigned)
 OUTPATIENT PHYSICAL THERAPY FEMALE PELVIC EVALUATION   Patient Name: Lisa White MRN: [id number] DOB:15-Feb-1936, 88 y.o., female Today's Date: 07/08/2024  END OF SESSION:  PT End of Session - 07/08/24 1406     Visit Number 1    Date for Recertification  09/30/24    Authorization Type [employer] medicare    Authorization - Visit Number 1    Progress Note Due on Visit 10    PT Start Time 1400    PT Stop Time 1440    PT Time Calculation (min) 40 min    Activity Tolerance Patient tolerated treatment well    Behavior During Therapy WFL for tasks assessed/performed          Past Medical History:  Diagnosis Date   Arthritis    Hyperlipidemia    Hypertension    Past Surgical History:  Procedure Laterality Date   APPENDECTOMY     FEMUR IM NAIL Right 02/18/2015   Procedure: INTRAMEDULLARY (IM) NAIL FEMORAL;  Surgeon: Donnice Car, MD;  Location: [facility];  Service: Orthopedics;  Laterality: Right;   ORIF FEMUR FRACTURE Left 09/17/2013   Procedure: OPEN REDUCTION INTERNAL FIXATION (ORIF) DISTAL FEMUR FRACTURE;  Surgeon: Donnice JONETTA Car, MD;  Location: [facility];  Service: Orthopedics;  Laterality: Left;   TONSILLECTOMY     Patient Active Problem List   Diagnosis Date Noted   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   Acute blood loss anemia 09/26/2013   Hyperlipidemia    Hypertension    Femur fracture, right (HCC) 09/17/2013   RBBB 09/17/2013   Fall at home 09/17/2013    PCP: Lenora Harvey, MD  REFERRING PROVIDER: Lanell Donald Stagger, PA-C   REFERRING DIAG: C20 (ICD-10-CM) - Adenocarcinoma of rectum [facility])   THERAPY DIAG:  No diagnosis found.  Rationale for Evaluation and Treatment: Rehabilitation  ONSET DATE: 06/17/2024  SUBJECTIVE:                                                                                                                                                                                           SUBJECTIVE STATEMENT: concurrent chemoradiation with  Xeloda , Clinical stage from 06/25/2024: Stage IIA (cT3, cN0, cM0) Colonoscopy showed a partially circumferential 5 cm tumor in the mid to upper rectum. Radiation starts on 07/13/24.    PERTINENT HISTORY:  Medications for current condition: Chemotherapy Surgeries: Appendectomy Other: rectal cancer Sexual abuse: No  PAIN:  Are you having pain? No  PRECAUTIONS: Other: rectal cancer  RED FLAGS: None   WEIGHT BEARING RESTRICTIONS: No  FALLS:  Has patient fallen in last 6 months? Yes. Number of falls 1, tried to step over  a drain by her car and did not clear it.   OCCUPATION: retired  ACTIVITY LEVEL : walking in the house  PLOF: Independent  PATIENT GOALS: pre care for radiation   BOWEL MOVEMENT: Pain with bowel movement: No Type of bowel movement:Type (Bristol Stool Scale) Type 4, Frequency daily, Strain no, and Splinting no Fully empty rectum: Yes:   Leakage: No                                                  Bowel urgency: no Pads: Yes: substance is coming from the rectum and does not feel it Fiber supplement/laxative No  URINATION: Pain with urination: No Fully empty bladder: Yes:                                           Post-void dribble: No Stream: Strong Urgency: No Frequency:during the day average                                                        Nocturia: No   Leakage: none Pads/briefs: Yes: for leakage of substance coming from the rectum  INTERCOURSE:  Ability to have vaginal penetration Yes  Pain with intercourse: none Dryness: No Marinoff Scale: 0/3  PREGNANCY: Vaginal deliveries 2 Tearing No  PROLAPSE: None   OBJECTIVE:  Note: Objective measures were completed at Evaluation unless otherwise noted.  PATIENT SURVEYS:  PFIQ-7: 0 UIQ-7 0 CRAIG -7 0 POPIQ-7 0  COGNITION: Overall cognitive status: Within functional limits for tasks assessed     SENSATION: Light touch: Appears intact   FUNCTIONAL TESTS:  5 times sit to stand:  16.86 sec 2 minute walk test: 276.2 feet TUG: 16.60 sec with using hands to get out of the chair Patient pulls herself up the steps; bedroom upstairs; husband and son lives with patient  GAIT: Assistive device utilized: Single point cane Comments: wide base of support, decreased heel strike  POSTURE: rounded shoulders, forward head, increased thoracic kyphosis, and wide base of support    LOWER EXTREMITY ROM:  Passive ROM Right eval Left eval  Hip flexion 110 110  Hip abduction 15 15  Hip internal rotation 10 10  Hip external rotation 20 30   (Blank rows = not tested)  LOWER EXTREMITY MMT:  MMT Right eval Left eval  Hip flexion 4/5 4/5  Hip extension 4/5 4/5  Hip abduction 4/5 4/5  Hip adduction 4/5 4/5  Knee flexion 5/5 5/5  Knee extension 5/5 5/5   (Blank rows = not tested) PALPATION: Pelvic Alignment: ASIS are equal  Abdominal:   Diastasis: No Distortion: No  Breathing: decreased movement of the lower rib cage                External Perineal Exam: ***                             Internal Pelvic Floor: ***  Patient confirms identification and approves PT to assess internal pelvic floor and treatment {yes/no:20286} All internal or external  pelvic floor assessments and/or treatments are completed with proper hand hygiene and gloves hands. If needed gloves are changed with hand hygiene during patient care time.  PELVIC MMT:   MMT eval  Vaginal   Internal Anal Sphincter   External Anal Sphincter   Puborectalis   (Blank rows = not tested)        TONE: ***  PROLAPSE: ***  TODAY'S TREATMENT:                                                                                                                              DATE: ***  EVAL ***   PATIENT EDUCATION:  Education details: *** Person educated: {Person educated:25204} Education method: {Education Method:25205} Education comprehension: {Education Comprehension:25206}  HOME EXERCISE  PROGRAM: Access Code: [email or web address] URL: [email or web address] Date: 07/08/2024 Prepared by: Channing Pereyra  Exercises - Single Knee to Chest Stretch  - 1 x daily - 7 x weekly - 1 sets - 2 reps - 30 sec hold - Supine Butterfly Groin Stretch  - 1 x daily - 7 x weekly - 1 sets - 1 reps - 30 sec hold - Supine Hamstring Stretch with Strap  - 1 x daily - 7 x weekly - 1 sets - 2 reps - 30 sec hold  ASSESSMENT:  CLINICAL IMPRESSION: Patient is a *** y.o. *** who was seen today for physical therapy evaluation and treatment for ***.   OBJECTIVE IMPAIRMENTS: {opptimpairments:25111}.   ACTIVITY LIMITATIONS: {activitylimitations:27494}  PARTICIPATION LIMITATIONS: {participationrestrictions:25113}  PERSONAL FACTORS: {Personal factors:25162} are also affecting patient's functional outcome.   REHAB POTENTIAL: {rehabpotential:25112}  CLINICAL DECISION MAKING: {clinical decision making:25114}  EVALUATION COMPLEXITY: {Evaluation complexity:25115}   GOALS: Goals reviewed with patient? {yes/no:20286}  SHORT TERM GOALS: Target date: ***  *** Baseline: Goal status: INITIAL  2.  *** Baseline:  Goal status: INITIAL  3.  *** Baseline:  Goal status: INITIAL  4.  *** Baseline:  Goal status: INITIAL  5.  *** Baseline:  Goal status: INITIAL  6.  *** Baseline:  Goal status: INITIAL  LONG TERM GOALS: Target date: 09/30/24  *** Baseline:  Goal status: INITIAL  2.  *** Baseline:  Goal status: INITIAL  3.  *** Baseline:  Goal status: INITIAL  4.  *** Baseline:  Goal status: INITIAL  5.  *** Baseline:  Goal status: INITIAL  6.  *** Baseline:  Goal status: INITIAL  PLAN:  PT FREQUENCY: {rehab frequency:25116}  PT DURATION: {rehab duration:25117}  PLANNED INTERVENTIONS: {rehab planned interventions:25118::97110-Therapeutic exercises,97530- Therapeutic [phone]- Neuromuscular re-education,97535- Self Rjmz,02859- Manual therapy,Patient/Family  education}  PLAN FOR NEXT SESSION: ***   Mylani Gentry, PT 07/08/2024, 2:06 PM

## 2024-07-07 NOTE — Progress Notes (Unsigned)
 88 y.o. G86P2002 female with recent diagnosis of stage 2 colon cancer (planning for chemoRT), stage III chronic kidney disease, mild cognitive impairment, vitamin D  deficiency, hypertension, hyperlipidemia, osteoporosis (Prolia  20 18-20 21) here for referral for abnormal ultrasound of endometrium. Married. Referred by [facility] gastroenterology.  No LMP recorded. Patient is postmenopausal.    She was referred to gastroenterology for blood in stool, seen with wiping. 20 pound weight loss this year. Chronic constipation hemorrhoids discussed with GI.  Offered colonoscopy however patient declined.  They ordered a CT abdomen pelvis which revealed colon mass. She has since been diagnosed with Stage 2 colon cancer.  No vaginal bleeding, no dysuria. No history of abnormal PAPs 2012 NIL  Last mammogram: 01/03/99  GYN HISTORY: No sig hx  OB History  Gravida Para Term Preterm AB Living  2 2 2   2   SAB IAB Ectopic Multiple Live Births      2    # Outcome Date GA Lbr Len/2nd Weight Sex Type Anes PTL Lv  2 Term      Vag-Spont   LIV  1 Term      Vag-Spont   LIV   Past Medical History:  Diagnosis Date   Arthritis    Hyperlipidemia    Hypertension    Past Surgical History:  Procedure Laterality Date   APPENDECTOMY     FEMUR IM NAIL Right 02/18/2015   Procedure: INTRAMEDULLARY (IM) NAIL FEMORAL;  Surgeon: Lisa Car, MD;  Location: [facility];  Service: Orthopedics;  Laterality: Right;   ORIF FEMUR FRACTURE Left 09/17/2013   Procedure: OPEN REDUCTION INTERNAL FIXATION (ORIF) DISTAL FEMUR FRACTURE;  Surgeon: Lisa Lisa Car, MD;  Location: [facility];  Service: Orthopedics;  Laterality: Left;   TONSILLECTOMY     Medications Ordered Prior to Encounter[1] Allergies[2]    PE Today's Vitals   07/07/24 1033  BP: 126/60  Pulse: 93  Temp: 98.2 F (36.8 C)  TempSrc: Oral  SpO2: 94%  Weight: 147 lb (66.7 kg)  Height: 5' 2.25 (1.581 m)   Body mass index is 26.67 kg/m.  Physical Exam Vitals  reviewed. Exam conducted with a chaperone present.  Constitutional:      General: She is not in acute distress.    Appearance: Normal appearance.  HENT:     Head: Normocephalic and atraumatic.     Nose: Nose normal.  Eyes:     Extraocular Movements: Extraocular movements intact.     Conjunctiva/sclera: Conjunctivae normal.  Pulmonary:     Effort: Pulmonary effort is normal.  Genitourinary:    General: Normal vulva.     Exam position: Lithotomy position.     Vagina: Normal. No vaginal discharge.     Cervix: Normal. No cervical motion tenderness, discharge or lesion.     Uterus: Normal. Not enlarged and not tender.      Adnexa: Right adnexa normal and left adnexa normal.  Musculoskeletal:        General: Normal range of motion.     Cervical back: Normal range of motion.  Neurological:     General: No focal deficit present.     Mental Status: She is alert.  Psychiatric:        Mood and Affect: Mood normal.        Behavior: Behavior normal.      Assessment and Plan:        Abnormal ultrasound of endometrium -     Endometrial biopsy; Future -     miSOPROStol ; Place  1 tablet (200 mcg total) vaginally once. Insert 12 hours vaginally prior to procedure  Dispense: 1 tablet; Refill: 0    PAP+EMB at next appt Consider lidocaine  Declined dx hysteroscopy for endometrial sampling.  Lisa LULLA Pa, MD     [1]  Current Outpatient Medications on File Prior to Visit  Medication Sig Dispense Refill   acetaminophen  (TYLENOL ) 325 MG tablet Take 2 tablets (650 mg total) by mouth every 6 (six) hours as needed for mild pain (or Fever >/= 101).     alum & mag hydroxide-simeth (MAALOX/MYLANTA) 200-200-20 MG/5ML suspension Take 30 mLs by mouth every 4 (four) hours as needed for indigestion. 355 mL 0   amLODipine  (NORVASC ) 10 MG tablet Take 10 mg by mouth at bedtime. For HTN     aspirin  EC 325 MG EC tablet Take 1 tablet (325 mg total) by mouth daily with breakfast. 30 tablet 0   atorvastatin   (LIPITOR) 20 MG tablet Take 20 mg by mouth at bedtime. For hyperlipidemia     calcium -vitamin D  (OSCAL WITH D) 500-200 MG-UNIT per tablet Take 1 tablet by mouth daily with breakfast.     capecitabine  (XELODA ) 500 MG tablet Take 2 tablets (1000 mg total) by mouth 2 (two) times daily on days of radiation, Monday through Friday. Take within 30 minutes after meals. 60 tablet 1   cholecalciferol  (VITAMIN D ) 1000 UNITS tablet Take 1,000 Units by mouth daily.     docusate sodium  (COLACE) 100 MG capsule Take 1 capsule (100 mg total) by mouth 2 (two) times daily. 10 capsule 0   methocarbamol  (ROBAXIN ) 500 MG tablet Take 1 tablet by mouth 3 (three) times daily as needed. spasms  0   naproxen sodium (ALEVE) 220 MG tablet Take 220 mg by mouth daily as needed.     oxyCODONE -acetaminophen  (PERCOCET/ROXICET) 5-325 MG per tablet Take 1-2 tablets by mouth every 4 (four) hours as needed for moderate pain. 60 tablet 0   PRESCRIPTION MEDICATION Apply 1 application topically as needed (eczema on the ear).     No current facility-administered medications on file prior to visit.  [2] No Known Allergies

## 2024-07-07 NOTE — Patient Instructions (Signed)
 Insert cytotec  inside the vagina at bedtime the night before your endometrial biopsy. Take tylenol  650mg  30 mins before your procedure.

## 2024-07-08 ENCOUNTER — Other Ambulatory Visit: Payer: Self-pay | Admitting: *Deleted

## 2024-07-08 ENCOUNTER — Other Ambulatory Visit: Payer: Self-pay

## 2024-07-08 ENCOUNTER — Encounter: Payer: Self-pay | Admitting: Physical Therapy

## 2024-07-08 ENCOUNTER — Ambulatory Visit: Admitting: Physical Therapy

## 2024-07-08 DIAGNOSIS — C20 Malignant neoplasm of rectum: Secondary | ICD-10-CM | POA: Insufficient documentation

## 2024-07-08 DIAGNOSIS — M6281 Muscle weakness (generalized): Secondary | ICD-10-CM | POA: Diagnosis present

## 2024-07-08 DIAGNOSIS — R2689 Other abnormalities of gait and mobility: Secondary | ICD-10-CM | POA: Insufficient documentation

## 2024-07-08 NOTE — Progress Notes (Unsigned)
 OUTPATIENT PHYSICAL THERAPY FEMALE PELVIC EVALUATION   Patient Name: Lisa White MRN: [id number] DOB:04-27-1936, 88 y.o., female Today's Date: 07/09/2024  END OF SESSION:  PT End of Session - 07/08/24 1406     Visit Number 1    Date for Recertification  09/30/24    Authorization Type [employer] medicare    Authorization - Visit Number 1    Progress Note Due on Visit 10    PT Start Time 1400    PT Stop Time 1440    PT Time Calculation (min) 40 min    Activity Tolerance Patient tolerated treatment well    Behavior During Therapy WFL for tasks assessed/performed          Past Medical History:  Diagnosis Date   Arthritis    Hyperlipidemia    Hypertension    Past Surgical History:  Procedure Laterality Date   APPENDECTOMY     FEMUR IM NAIL Right 02/18/2015   Procedure: INTRAMEDULLARY (IM) NAIL FEMORAL;  Surgeon: Donnice Car, MD;  Location: [facility];  Service: Orthopedics;  Laterality: Right;   ORIF FEMUR FRACTURE Left 09/17/2013   Procedure: OPEN REDUCTION INTERNAL FIXATION (ORIF) DISTAL FEMUR FRACTURE;  Surgeon: Donnice JONETTA Car, MD;  Location: [facility];  Service: Orthopedics;  Laterality: Left;   TONSILLECTOMY     Patient Active Problem List   Diagnosis Date Noted   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   Acute blood loss anemia 09/26/2013   Hyperlipidemia    Hypertension    Femur fracture, right (HCC) 09/17/2013   RBBB 09/17/2013   Fall at home 09/17/2013    PCP: Lenora Harvey, MD  REFERRING PROVIDER: Lanell Donald Stagger, PA-C   REFERRING DIAG: C20 (ICD-10-CM) - Adenocarcinoma of rectum [facility])   THERAPY DIAG:  Other abnormalities of gait and mobility  Muscle weakness (generalized)  Rectal cancer (HCC)  Rationale for Evaluation and Treatment: Rehabilitation  ONSET DATE: 06/17/2024  SUBJECTIVE:                                                                                                                                                                                            SUBJECTIVE STATEMENT: concurrent chemoradiation with Xeloda , Clinical stage from 06/25/2024: Stage IIA (cT3, cN0, cM0) Colonoscopy showed a partially circumferential 5 cm tumor in the mid to upper rectum. Radiation starts on 07/13/24.    PERTINENT HISTORY:  Medications for current condition: Chemotherapy Surgeries: Appendectomy Other: rectal cancer Sexual abuse: No  PAIN:  Are you having pain? No  PRECAUTIONS: Other: rectal cancer  RED FLAGS: None   WEIGHT BEARING RESTRICTIONS: No  FALLS:  Has patient fallen in last  6 months? Yes. Number of falls 1, tried to step over a drain by her car and did not clear it.   OCCUPATION: retired  ACTIVITY LEVEL : walking in the house  PLOF: Independent  PATIENT GOALS: pre care for radiation   BOWEL MOVEMENT: Pain with bowel movement: No Type of bowel movement:Type (Bristol Stool Scale) Type 4, Frequency daily, Strain no, and Splinting no Fully empty rectum: Yes:   Leakage: No                                                  Bowel urgency: no Pads: Yes: substance is coming from the rectum and does not feel it Fiber supplement/laxative No  URINATION: Pain with urination: No Fully empty bladder: Yes:                                           Post-void dribble: No Stream: Strong Urgency: No Frequency:during the day average                                                        Nocturia: No   Leakage: none Pads/briefs: Yes: for leakage of substance coming from the rectum  INTERCOURSE:  Ability to have vaginal penetration Yes  Pain with intercourse: none Dryness: No Marinoff Scale: 0/3  PREGNANCY: Vaginal deliveries 2 Tearing No  PROLAPSE: None   OBJECTIVE:  Note: Objective measures were completed at Evaluation unless otherwise noted.  PATIENT SURVEYS:  PFIQ-7: 0 UIQ-7 0 CRAIG -7 0 POPIQ-7 0  COGNITION: Overall cognitive status: Within functional limits for tasks  assessed     SENSATION: Light touch: Appears intact   FUNCTIONAL TESTS:  5 times sit to stand: 16.86 sec 2 minute walk test: 276.2 feet TUG: 16.60 sec with using hands to get out of the chair Patient pulls herself up the steps; bedroom upstairs; husband and son lives with patient  GAIT: Assistive device utilized: Single point cane Comments: wide base of support, decreased heel strike  POSTURE: rounded shoulders, forward head, increased thoracic kyphosis, and wide base of support    LOWER EXTREMITY ROM:  Passive ROM Right eval Left eval  Hip flexion 110 110  Hip abduction 15 15  Hip internal rotation 10 10  Hip external rotation 20 30   (Blank rows = not tested)  LOWER EXTREMITY MMT:  MMT Right eval Left eval  Hip flexion 4/5 4/5  Hip extension 4/5 4/5  Hip abduction 4/5 4/5  Hip adduction 4/5 4/5  Knee flexion 5/5 5/5  Knee extension 5/5 5/5   (Blank rows = not tested) PALPATION: Pelvic Alignment: ASIS are equal  Abdominal:   Diastasis: No Distortion: No  Breathing: decreased movement of the lower rib cage                Patient confirms identification and approves PT to assess internal pelvic floor and treatment No not at this time due to active cancer in the rectal area   PELVIC MMT:   MMT eval  Vaginal   Internal Anal Sphincter   External  Anal Sphincter   Puborectalis   (Blank rows = not tested)         TODAY'S TREATMENT:                                                                                                                              DATE: 07/08/24  EVAL Examination completed, findings reviewed, pt educated on POC, HEP, and female pelvic floor anatomy, reasoning with pelvic floor assessment internally with pt consent. Pt motivated to participate in PT and agreeable to attempt recommendations.     PATIENT EDUCATION:  07/08/24 Education details: Access Code: [email or web address], perineal care, pelvic floor exercise, energy  conservation Person educated: Patient Education method: Explanation, Demonstration, Tactile cues, Verbal cues, and Handouts Education comprehension: verbalized understanding, returned demonstration, verbal cues required, tactile cues required, and needs further education  HOME EXERCISE PROGRAM:07/08/24 Access Code: [email or web address] URL: [email or web address] Date: 07/08/2024 Prepared by: Channing Pereyra  Exercises - Single Knee to Chest Stretch  - 1 x daily - 7 x weekly - 1 sets - 2 reps - 30 sec hold - Supine Butterfly Groin Stretch  - 1 x daily - 7 x weekly - 1 sets - 1 reps - 30 sec hold - Supine Hamstring Stretch with Strap  - 1 x daily - 7 x weekly - 1 sets - 2 reps - 30 sec hold  ASSESSMENT:  CLINICAL IMPRESSION: Patient is a 88 y.o. female who was seen today for physical therapy evaluation and treatment for rectal cancer. Patient was diagnosis with rectal cancer on 06/17/24. She  will start chemotherapy and radiation on 07/13/24 till 08/24/24. She ambulates with a single point cane with wide base of support, decreased heel strike, and increased trunk sway. Sit to stand 5 times is 16.86 sec. 2 minute walk test for  276.2 feet. She is at risk of falling if she has to quickly go to the bathroom due to loose stools from future radiation treatment. She has decreased bilateral hip strength and ROM. She is presently having a substance coming out of the rectum and not sure what it is. Patient will benefit from skilled therapy to be educated on what to expect during the radiation, working on endurance and balance and pelvic floor exercises.    OBJECTIVE IMPAIRMENTS: difficulty walking and decreased strength.   ACTIVITY LIMITATIONS: toileting and locomotion level  PARTICIPATION LIMITATIONS: community activity  PERSONAL FACTORS: 1-2 comorbidities: rectal cancer are also affecting patient's functional outcome.   REHAB POTENTIAL: Excellent  CLINICAL DECISION MAKING: Evolving/moderate  complexity  EVALUATION COMPLEXITY: Moderate   GOALS: Goals reviewed with patient? Yes  SHORT TERM GOALS: Target date: 08/05/24  Patient educated on how to take care of the perineal area while receiving radiation.  Baseline: Goal status: INITIAL  2.  Patient educated on energy conservation while she is receiving radiation and chemotherapy.  Baseline:  Goal status: INITIAL  3.  Patient educated on balance exercises to reduce risk of falling while walking  to the bathroom.  Baseline:  Goal status: INITIAL  4.  Patient educated on pelvic floor exercises to reduce risk of leaking stool as she is getting radiation.  Baseline:  Goal status: INITIAL  5.  2 minute walk test >/= 300 feet with exertion of 3.  Baseline:  Goal status: INITIAL   LONG TERM GOALS: Target date: 09/30/24  Patient independent with advanced HEP for balance, strengthening, and flexibility exercises.  Baseline:  Goal status: INITIAL  2.  Patient educated on using a vaginal dilator to keep the expansion of the vaginal canal for exams and penile penetration.  Baseline:  Goal status: INITIAL  3.  TUG score </= 13 seconds to improve her balance walking to the bathroom.  Baseline:  Goal status: INITIAL  4.  2 minute walk test >/= 350 feet due to her building her endurance and reduce risk of falls.  Baseline:  Goal status: INITIAL  5.  Patient is able to report no fecal leakage after she has received radiation and her stool consistency changes.  Baseline:  Goal status: INITIAL    PLAN:  PT FREQUENCY: 1-2x/week  PT DURATION: 12 weeks  PLANNED INTERVENTIONS: 97110-Therapeutic exercises, 97530- Therapeutic activity, 97112- Neuromuscular re-education, 97535- Self Care, [id number]- Manual therapy, [phone]- Gait training, Patient/Family education, Balance training, and Biofeedback  PLAN FOR NEXT SESSION: balance training, 2 minute walk test, review pelvic floor information about perineal care, energy conservation,  hip stretches   Channing Pereyra, PT 07/09/2024 8:32 AM

## 2024-07-08 NOTE — Patient Instructions (Signed)
 Skin Care and Bowel Hygiene  Anyone who has frequent bowel movements, diarrhea, or bowel leakage (fecal incontinence) may experience soreness or skin irritation around the anal region.  Occasionally, the skin can become so inflamed that it breaks into open sores.  Prevent skin breakdown by following good skin care habits.  Cleaning and Washing Techniques After having a bowel movement, men and women should tighten their anal sphincter before wiping.  Women should always wipe from front to back to prevent fecal matter from getting into the urethra and vagina.   Tips for Cleaning and Washing wipe from front to back towards the anus always wipe gently with soft toilet paper, or ideally with moist toilet paper wipe only once with each piece of toilet paper so as not to re-contaminate the area wash in warm water alone or with a minimal amount of mild, fragrance-free soap use non-biological washing powder gently pat skin completely dry, avoiding rubbing if drying the skin after washing is difficult or uncomfortable, try using a hairdryer on a low setting (use very carefully) allow air to get to the irritated area for some part of every day use protective skin creams containing zinc as recommended by your doctor  What To Avoid baths with extra-hot water soaking for long periods of time in the bathtub disinfectants and antiseptics  bath oils, bath salts, and talcum powder using plastic pants, pads, and sheets, which cause sweating scratching at the irritated area  Additional Tips some people find that citrus and acidic foods cause or worsen skin irritation eat a healthy, balanced diet that is high in fiber  drink plenty of fluids wear cotton underwear to allow the skin to breath talk to your healthcare provider about further treatment options; persistent problems need medical attention   Tips for Energy Conservation for Activities of Daily Living Plan ahead to avoid rushing. Sit down to  bathe and dry off. Wear a terry robe instead of drying off. Use a shower/bath organizer to decrease leaning and reaching. Use extension handles on sponges and brushes. Install grab rails in the bathroom or use an elevated toilet seat. Lay out clothes and toiletries before dressing. Minimize leaning over to put on clothes and shoes. Bring your foot to your knee to apply socks and shoes. Wear comfortable shoes and low-heeled, slip on shoes. Wear button front shirts rather than pullovers. Housekeeping Schedule household tasks throughout the week. Do housework sitting down when possible. Delegate heavy housework, shopping, laundry and child care when possible. Drag or slide objects rather than lifting. Sit when ironing and take rest periods. Stop working before becoming overly tired. Retail Banker by aisle. Use a grocery cart for support. Shop at less busy times. Ask for help with getting to the car. Meal Preparation Use convenience and easy-to-prepare foods. Use small appliances that take less effort to use. Prepare meals sitting down. Soak dishes instead of scrubbing and let dishes air dry. Prepare double portions and freeze half. Child Care Plan activities that can be done sitting down, such as drawing pictures, playing games, reading, and computer games. Encourage children to climb up onto your lap or into the highchair instead of being lifted. Make a game of the household chores so that children will want to help. Delegate child care when possible.  Pelvic Floor Exercises for Bowel Control Exercises using both the external anal sphincter and the deep pelvic floor muscles can help you to improve your bowel control. When done correctly, these exercises can tone and strengthen  the muscles to help you hold back gas and prevent fecal incontinence (leakage of stool). Exercise programs take time; you may not see any noticeable change in your bowel control immediately.  In some  cases it may take several months to regain control. Bowel Control Muscles The anus and the anal canal, has rings of muscle around it. The outer ring of muscle is called the external anal sphincter; it is a voluntary muscle which you can learn to tighten and close more efficiently. When you contract it you will feel the skin around your anus tighten and pull in as if the anus is winking. Try to keep the buttocks muscles relaxed. The inner ring around the anus is the internal anal sphincter. It is an involuntary and automatic muscle; you don't have to think to keep it closed or open.  This muscle should be closed at all times, except when you are actually trying to have a bowel movement.  In addition to the sphincter muscles, there are deeper muscles called the levator ani that form a sling from your tailbone to your pubic bone. The levator ani muscle has a specific part called the puborectalis that holds stool in until you give the signal to relax and empty.  When you contract these muscles it creates a feeling of lifting the anus inward.   External Anal Sphincter     Levator ani deep layer   Effective Exercises for Control of Gas and Bowels Identify the specific areas of the pelvic floor muscles you need to use.  This can be done using a mirror to see if you are contracting the correct muscles or by placing the pad of your finger at or just inside the anal opening. Develop an exercise plan for strength, endurance and quick response of the muscles and stick with it.  You must make the muscles do more than they are used to doing. Incorporate the exercises into your daily activities.   Hold 15 sec 10 x 3 times per day.   [facility] [address] [place], [place] [address] Phone # [phone] Fax [phone]

## 2024-07-08 NOTE — Progress Notes (Signed)
 The proposed treatment discussed in conference is for discussion purpose only and is not a binding recommendation.  The patients have not been physically examined, or presented with their treatment options.  Therefore, final treatment plans cannot be decided.

## 2024-07-10 ENCOUNTER — Encounter: Payer: Self-pay | Admitting: Dietician

## 2024-07-10 ENCOUNTER — Encounter: Attending: Family Medicine | Admitting: Dietician

## 2024-07-10 DIAGNOSIS — Z510 Encounter for antineoplastic radiation therapy: Secondary | ICD-10-CM | POA: Diagnosis not present

## 2024-07-10 DIAGNOSIS — R7303 Prediabetes: Secondary | ICD-10-CM | POA: Diagnosis not present

## 2024-07-10 NOTE — Progress Notes (Signed)
 Class start Time: 1530   Class End Time: 1630  This was a class of 5 patients.  Patient was seen on 07/10/24 for the Diabetes Prevention Program course at [facility]. By the end of this session patients are able to complete the following objectives:   Learning Objectives: Counter self-defeating thoughts with positive self-statements Define assertiveness.  List examples of ways to practice assertiveness.   Goals:  Record weight taken outside of class.  Track foods and beverages eaten each day in the Food and Activity Tracker, including calories and fat grams for each item.   Track activity type, minutes you were active, and distance you reached each day in the Food and Activity Tracker.   Follow-Up Plan: Attend next session.  Bring completed Food and Activity Trackers to next session to be reviewed by Lifestyle Coach.

## 2024-07-12 NOTE — Assessment & Plan Note (Signed)
 cT3bN0M0 stage IIa, MMR normal, G2 - Diagnosed in November 2025.  Patient presented with intermittent rectal bleeding.  Colonoscopy showed a partially circumferential 5 cm tumor in the mid to upper rectum.  Biopsy confirmed adenocarcinoma -CT staging scans negative for distant metastasis.  Pelvic MRI showed T3 pN0 disease. -Plan to start concurrent chemoradiation, she is not a good candidate for total neoadjuvant due to her advanced age.

## 2024-07-13 ENCOUNTER — Inpatient Hospital Stay

## 2024-07-13 ENCOUNTER — Ambulatory Visit
Admission: RE | Admit: 2024-07-13 | Discharge: 2024-07-13 | Attending: Radiation Oncology | Admitting: Radiation Oncology

## 2024-07-13 ENCOUNTER — Inpatient Hospital Stay: Admitting: Hematology

## 2024-07-13 ENCOUNTER — Other Ambulatory Visit: Payer: Self-pay

## 2024-07-13 VITALS — BP 155/76 | HR 81 | Temp 98.0°F | Resp 17 | Wt 148.5 lb

## 2024-07-13 DIAGNOSIS — Z510 Encounter for antineoplastic radiation therapy: Secondary | ICD-10-CM | POA: Diagnosis not present

## 2024-07-13 DIAGNOSIS — C20 Malignant neoplasm of rectum: Secondary | ICD-10-CM | POA: Diagnosis not present

## 2024-07-13 LAB — CBC WITH DIFFERENTIAL (CANCER CENTER ONLY)
Abs Immature Granulocytes: 0.03 K/uL (ref 0.00–0.07)
Basophils Absolute: 0.1 K/uL (ref 0.0–0.1)
Basophils Relative: 1 %
Eosinophils Absolute: 0.2 K/uL (ref 0.0–0.5)
Eosinophils Relative: 3 %
HCT: 40.9 % (ref 36.0–46.0)
Hemoglobin: 13.6 g/dL (ref 12.0–15.0)
Immature Granulocytes: 0 %
Lymphocytes Relative: 18 %
Lymphs Abs: 1.5 K/uL (ref 0.7–4.0)
MCH: 30.6 pg (ref 26.0–34.0)
MCHC: 33.3 g/dL (ref 30.0–36.0)
MCV: 92.1 fL (ref 80.0–100.0)
Monocytes Absolute: 0.7 K/uL (ref 0.1–1.0)
Monocytes Relative: 8 %
Neutro Abs: 5.6 K/uL (ref 1.7–7.7)
Neutrophils Relative %: 70 %
Platelet Count: 291 K/uL (ref 150–400)
RBC: 4.44 MIL/uL (ref 3.87–5.11)
RDW: 12.9 % (ref 11.5–15.5)
WBC Count: 8.1 K/uL (ref 4.0–10.5)
nRBC: 0 % (ref 0.0–0.2)

## 2024-07-13 LAB — RAD ONC ARIA SESSION SUMMARY
Course Elapsed Days: 0
Plan Fractions Treated to Date: 1
Plan Prescribed Dose Per Fraction: 1.8 Gy
Plan Total Fractions Prescribed: 25
Plan Total Prescribed Dose: 45 Gy
Reference Point Dosage Given to Date: 1.8 Gy
Reference Point Session Dosage Given: 1.8 Gy
Session Number: 1

## 2024-07-13 LAB — FERRITIN: Ferritin: 125 ng/mL (ref 11–307)

## 2024-07-13 LAB — CMP (CANCER CENTER ONLY)
ALT: 13 U/L (ref 0–44)
AST: 21 U/L (ref 15–41)
Albumin: 4.1 g/dL (ref 3.5–5.0)
Alkaline Phosphatase: 65 U/L (ref 38–126)
Anion gap: 9 (ref 5–15)
BUN: 19 mg/dL (ref 8–23)
CO2: 27 mmol/L (ref 22–32)
Calcium: 9 mg/dL (ref 8.9–10.3)
Chloride: 106 mmol/L (ref 98–111)
Creatinine: 1.06 mg/dL — ABNORMAL HIGH (ref 0.44–1.00)
GFR, Estimated: 51 mL/min — ABNORMAL LOW
Glucose, Bld: 91 mg/dL (ref 70–99)
Potassium: 4.4 mmol/L (ref 3.5–5.1)
Sodium: 142 mmol/L (ref 135–145)
Total Bilirubin: 0.4 mg/dL (ref 0.0–1.2)
Total Protein: 7 g/dL (ref 6.5–8.1)

## 2024-07-13 LAB — CEA (ACCESS): CEA (CHCC): 1.68 ng/mL (ref 0.00–5.00)

## 2024-07-13 NOTE — Progress Notes (Signed)
 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  Patient presented with intermittent rectal bleeding.  Colonoscopy showed a partially circumferential 5 cm tumor in the mid to upper rectum.  Biopsy confirmed adenocarcinoma -CT staging scans negative for distant metastasis.  Pelvic MRI showed T3 pN0 disease. -Plan to start concurrent chemoradiation, she is not a good candidate for total neoadjuvant due to her advanced age.  Assessment & Plan Rectal adenocarcinoma She is undergoing concurrent chemoradiation and is tolerating therapy well without reported adverse effects. Blood counts remain normal, and there is no evidence of treatment-related toxicity. Tumor marker testing was discontinued due to lack of clinical utility. Kidney and liver function tests are pending. - Continued concurrent chemoradiation as scheduled. - Instructed to space chemotherapy pill doses 10-12 hours apart between meals and to avoid administration on an empty stomach. - Held chemotherapy pill from Christmas Eve until the following Monday per protocol. - Cancelled tumor marker testing. - Reviewed blood counts, which were normal. - Ordered weekly laboratory monitoring including complete blood count and kidney/liver function tests. - Scheduled follow-up appointments: next week with nurse practitioner, January 5th, 12th, 19th, and 26th. - Advised to contact pharmacy for chemotherapy pill refill prior to depletion. - Provided anticipatory guidance regarding meal timing and radiation appointments.  Plan - She started  concurrent chemoradiation today, tolerated first dose capecitabine  well this morning.  Side effects and management reviewed with her and her husband. - Lab weekly, office visit in 1, 3 and 5 weeks.   SUMMARY OF ONCOLOGIC HISTORY: Oncology History  Adenocarcinoma of rectum (HCC)  06/17/2024 Initial Diagnosis   Adenocarcinoma of rectum (HCC)   06/25/2024 Cancer Staging   Staging form: Colon and Rectum, AJCC 8th Edition - Clinical stage from 06/25/2024: Stage IIA (cT3, cN0, cM0) - Signed by Lanny Callander, MD on 06/29/2024 Total positive nodes: 0 Histologic grade (G): G2 Histologic grading system: 4 grade system      Discussed the use of AI scribe software for clinical note transcription with the patient, who gave verbal consent to proceed.  History of Present Illness Lisa White is an 88 year old female with rectal adenocarcinoma who presents for follow-up to assess treatment tolerance during concurrent chemoradiation.  She is undergoing concurrent chemoradiation for rectal adenocarcinoma, taking oral chemotherapy twice daily with meals and receiving scheduled radiation. She denies chemotherapy or radiation side effects, including gastrointestinal symptoms.  She adheres to the prescribed dosing schedule, spacing doses 10-12 hours apart, and uses access to food at the hospital cafeteria to take medications with meals around radiation appointments.  She has had no new or worsening symptoms since her last visit and feels well overall.  She and her son reviewed the treatment schedule and medication management with the oncology team, and she uses a calendar to track appointments and treatments. No concerns affecting treatment were identified.     All other systems were reviewed with the patient and are negative.  MEDICAL HISTORY:  Past Medical History:  Diagnosis Date   Arthritis    Hyperlipidemia    Hypertension     SURGICAL HISTORY: Past Surgical History:  Procedure Laterality  Date   APPENDECTOMY     FEMUR IM NAIL Right 02/18/2015   Procedure: INTRAMEDULLARY (IM) NAIL FEMORAL;  Surgeon: Donnice Car, MD;  Location: [facility];  Service: Orthopedics;  Laterality: Right;   ORIF FEMUR FRACTURE Left 09/17/2013   Procedure: OPEN REDUCTION INTERNAL FIXATION (ORIF) DISTAL FEMUR FRACTURE;  Surgeon: Donnice JONETTA Car, MD;  Location: [facility];  Service: Orthopedics;  Laterality: Left;   TONSILLECTOMY      I have reviewed the social history and family history with the patient and they are unchanged from previous note.  ALLERGIES:  has no known allergies.  MEDICATIONS:  Current Outpatient Medications  Medication Sig Dispense Refill   acetaminophen  (TYLENOL ) 325 MG tablet Take 2 tablets (650 mg total) by mouth every 6 (six) hours as needed for mild pain (or Fever >/= 101).     alum & mag hydroxide-simeth (MAALOX/MYLANTA) 200-200-20 MG/5ML suspension Take 30 mLs by mouth every 4 (four) hours as needed for indigestion. 355 mL 0   amLODipine  (NORVASC ) 10 MG tablet Take 10 mg by mouth at bedtime. For HTN     aspirin  EC 325 MG EC tablet Take 1 tablet (325 mg total) by mouth daily with breakfast. 30 tablet 0   atorvastatin  (LIPITOR) 20 MG tablet Take 20 mg by mouth at bedtime. For hyperlipidemia     calcium -vitamin D  (OSCAL WITH D) 500-200 MG-UNIT per tablet Take 1 tablet by mouth daily with breakfast.     capecitabine  (XELODA ) 500 MG tablet Take 2 tablets (1000 mg total) by mouth 2 (two) times daily on days of radiation, Monday through Friday. Take within 30 minutes after meals. 60 tablet 1   cholecalciferol  (VITAMIN D ) 1000 UNITS tablet Take 1,000 Units by mouth daily.     docusate sodium  (COLACE) 100 MG capsule Take 1 capsule (100 mg total) by mouth 2 (two) times daily. 10 capsule 0   methocarbamol  (ROBAXIN ) 500 MG tablet Take 1 tablet by mouth 3 (three) times daily as needed. spasms  0   misoprostol  (CYTOTEC ) 200 MCG tablet Place 1 tablet (200 mcg total) vaginally once. Insert 12 hours  vaginally prior to procedure 1 tablet 0   naproxen sodium (ALEVE) 220 MG tablet Take 220 mg by mouth daily as needed.     oxyCODONE -acetaminophen  (PERCOCET/ROXICET) 5-325 MG per tablet Take 1-2 tablets by mouth every 4 (four) hours as needed for moderate pain. 60 tablet 0   PRESCRIPTION MEDICATION Apply 1 application topically as needed (eczema on the ear).     No current facility-administered medications for this visit.    PHYSICAL EXAMINATION: ECOG PERFORMANCE STATUS: 1 - Symptomatic but completely ambulatory  Vitals:   07/13/24 1114 07/13/24 1115  BP: (!) 154/76 (!) 155/76  Pulse: 81   Resp: 17   Temp: 98 F (36.7 C)   SpO2: 96%    Wt Readings from Last 3 Encounters:  07/13/24 148 lb 8 oz (67.4 kg)  07/07/24 147 lb (66.7 kg)  07/02/24 147 lb 6.4 oz (66.9 kg)     GENERAL:alert, no distress and comfortable SKIN: skin color, texture, turgor are normal, no rashes or significant lesions EYES: normal, Conjunctiva are pink and non-injected, sclera clear Musculoskeletal:no cyanosis of digits and no clubbing  NEURO: alert & oriented x 3 with fluent speech, no focal motor/sensory deficits  Physical Exam    LABORATORY DATA:  I have reviewed the data as listed    Latest Ref Rng & Units 07/13/2024   10:47 AM 06/29/2024  12:12 PM 06/17/2024   11:57 AM  CBC  WBC 4.0 - 10.5 K/uL 8.1  7.2  7.4   Hemoglobin 12.0 - 15.0 g/dL 86.3  85.7  85.6   Hematocrit 36.0 - 46.0 % 40.9  42.8  42.9   Platelets 150 - 400 K/uL 291  245  265         Latest Ref Rng & Units 07/13/2024   10:47 AM 06/29/2024   12:12 PM 06/17/2024   11:57 AM  CMP  Glucose 70 - 99 mg/dL 91  99  898   BUN 8 - 23 mg/dL 19  19  18    Creatinine 0.44 - 1.00 mg/dL 8.93  8.97  8.95   Sodium 135 - 145 mmol/L 142  142  143   Potassium 3.5 - 5.1 mmol/L 4.4  4.2  4.6   Chloride 98 - 111 mmol/L 106  106  107   CO2 22 - 32 mmol/L 27  24  27    Calcium  8.9 - 10.3 mg/dL 9.0  9.1  9.3   Total Protein 6.5 - 8.1 g/dL 7.0  7.4   7.3   Total Bilirubin 0.0 - 1.2 mg/dL 0.4  0.6  0.6   Alkaline Phos 38 - 126 U/L 65  68  65   AST 15 - 41 U/L 21  21  20    ALT 0 - 44 U/L 13  12  12        RADIOGRAPHIC STUDIES: I have personally reviewed the radiological images as listed and agreed with the findings in the report. No results found.    No orders of the defined types were placed in this encounter.  All questions were answered. The patient knows to call the clinic with any problems, questions or concerns. No barriers to learning was detected. The total time spent in the appointment was 25 minutes, including review of chart and various tests results, discussions about plan of care and coordination of care plan     Onita Mattock, MD 07/13/2024     "

## 2024-07-14 ENCOUNTER — Ambulatory Visit
Admission: RE | Admit: 2024-07-14 | Discharge: 2024-07-14 | Disposition: A | Discharge status: Home / Self Care | Source: Ambulatory Visit | Attending: Radiation Oncology | Admitting: Radiation Oncology

## 2024-07-14 ENCOUNTER — Other Ambulatory Visit: Payer: Self-pay

## 2024-07-14 DIAGNOSIS — Z510 Encounter for antineoplastic radiation therapy: Secondary | ICD-10-CM | POA: Diagnosis not present

## 2024-07-14 LAB — RAD ONC ARIA SESSION SUMMARY
Course Elapsed Days: 1
Plan Fractions Treated to Date: 2
Plan Prescribed Dose Per Fraction: 1.8 Gy
Plan Total Fractions Prescribed: 25
Plan Total Prescribed Dose: 45 Gy
Reference Point Dosage Given to Date: 3.6 Gy
Reference Point Session Dosage Given: 1.8 Gy
Session Number: 2

## 2024-07-14 NOTE — Progress Notes (Signed)
 PATIENT NAVIGATOR PROGRESS NOTE  Name: Lisa White Date: 07/14/2024 MRN: [id number]  DOB: 01-Jul-1936  Patient is established with a treatment plan and is actively engaged in care. Nurse Navigator services not currently indicated at this time. Will re-evaluate if needs change or if additional support is requested.

## 2024-07-15 ENCOUNTER — Other Ambulatory Visit: Payer: Self-pay

## 2024-07-15 ENCOUNTER — Ambulatory Visit
Admission: RE | Admit: 2024-07-15 | Discharge: 2024-07-15 | Disposition: A | Source: Ambulatory Visit | Attending: Radiation Oncology

## 2024-07-15 DIAGNOSIS — Z510 Encounter for antineoplastic radiation therapy: Secondary | ICD-10-CM | POA: Diagnosis not present

## 2024-07-15 LAB — RAD ONC ARIA SESSION SUMMARY
Course Elapsed Days: 2
Plan Fractions Treated to Date: 3
Plan Prescribed Dose Per Fraction: 1.8 Gy
Plan Total Fractions Prescribed: 25
Plan Total Prescribed Dose: 45 Gy
Reference Point Dosage Given to Date: 5.4 Gy
Reference Point Session Dosage Given: 1.8 Gy
Session Number: 3

## 2024-07-19 ENCOUNTER — Other Ambulatory Visit: Payer: Self-pay | Admitting: Nurse Practitioner

## 2024-07-19 DIAGNOSIS — C20 Malignant neoplasm of rectum: Secondary | ICD-10-CM

## 2024-07-19 NOTE — Assessment & Plan Note (Addendum)
 cT3bN0M0 stage IIa, MMR normal, G2 - Diagnosed in November 2025.  Patient presented with intermittent rectal bleeding.  Colonoscopy showed a partially circumferential 5 cm tumor in the mid to upper rectum.  Biopsy confirmed adenocarcinoma -CT staging scans negative for distant metastasis.  Pelvic MRI showed T3 pN0 disease. -Plan to start concurrent chemoradiation, she is not a good candidate for total neoadjuvant due to her advanced age. - She started concurrent chemoradiation on 07/13/2024. -- 07/20/2024   -tolerating concurrent chemoradiation well with fewer side effects than expected.  Continue Xeloda  2 tablets twice daily on days of radiation.  Continue with weekly lab checks and biweekly follow-ups.

## 2024-07-19 NOTE — Progress Notes (Signed)
 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  Patient presented with intermittent rectal bleeding.  Colonoscopy showed a partially circumferential 5 cm tumor in the mid to upper rectum.  Biopsy confirmed adenocarcinoma -CT staging scans negative for distant metastasis.  Pelvic MRI showed T3 pN0 disease. -Plan to start concurrent chemoradiation, she is not a good candidate for total neoadjuvant due to her advanced age. - She started concurrent chemoradiation on 07/13/2024. -- 07/20/2024   -tolerating concurrent chemoradiation well with fewer side effects than expected.  Continue Xeloda  2 tablets twice daily on days of radiation.  Continue with weekly lab checks and biweekly follow-ups.   Loose stools Patient states she is having some loose type stools, sometimes with blood-tinged discharge.  This is improving in frequency and severity.  Denies painful bowel movements.  Denies frank blood in her stools.  Monitor.  Rectal cancer Currently undergoing concurrent chemoradiation which she started on 12/10/2023.  Overall tolerating well.  She denies nausea or vomiting.  She states her appetite is good.  States this week.  Is slightly enhanced.  Skin is doing well without problems on her hands or feet.  Blood count is unremarkable renal function slightly decreased.  Will continue to monitor labs every week and follow-up every 2 weeks.  Plan Labs reviewed. -CBC is unremarkable. - CMP with slight reduction of renal functions.  Discussed aggressive oral hydration.  Recheck in 1 week. Tolerating concurrent chemoradiation very well with minimal negative side effects. Continue with Xeloda  2 tablets in the morning and 2 tablets in the evening days of radiation. Continue  weekly lab checks. Follow-up in 2 weeks with labs.  The patient understands the plans discussed today and is in agreement with them.  She knows to contact our office if she develops concerns prior to her next appointment.  I provided 20 minutes of face-to-face time during this encounter and > 50% was spent counseling as documented under my assessment and plan.    Powell FORBES Lessen, NP  [facility] - A DEPT OF [facility] [address] [place] [address] Dept: [phone] Dept Fax: [phone]   No orders of the defined types were placed in this encounter.     CHIEF COMPLAINT:  CC: Rectal cancer  Current Treatment: Concurrent chemoradiation with capecitabine   INTERVAL HISTORY:  Lisa White is here today for repeat clinical assessment.  She was last seen by Dr. Lanny on 07/13/2024.  She started concurrent chemoradiation on 07/13/2024.  Reports tolerating treatment better than expected.  Denies nausea or vomiting.  Denies development of neuropathy.  Denies painful bowel movements.  Sometimes, has blood-tinged discharge after bowel movements.  This is improving and decreasing in severity.  She denies problems with her skin, even on the palms of her hands or soles of her feet.  She denies chest pain, chest pressure, or shortness of breath. She denies headaches or visual disturbances. She denies abdominal pain, nausea, vomiting, or changes in bowel or bladder habits.  She denies fevers or chills. She denies pain. Her appetite is good. Her weight has decreased 2 pounds over last week.  I have reviewed the past medical history, past surgical history, social history and family history with the  patient and they are unchanged from previous note.  ALLERGIES:  has no known allergies.  MEDICATIONS:  Current Outpatient Medications  Medication Sig Dispense Refill   acetaminophen  (TYLENOL ) 325 MG tablet Take 2 tablets (650 mg total)  by mouth every 6 (six) hours as needed for mild pain (or Fever >/= 101).     alum & mag hydroxide-simeth (MAALOX/MYLANTA) 200-200-20 MG/5ML suspension Take 30 mLs by mouth every 4 (four) hours as needed for indigestion. 355 mL 0   amLODipine  (NORVASC ) 10 MG tablet Take 10 mg by mouth at bedtime. For HTN     aspirin  EC 325 MG EC tablet Take 1 tablet (325 mg total) by mouth daily with breakfast. 30 tablet 0   atorvastatin  (LIPITOR) 20 MG tablet Take 20 mg by mouth at bedtime. For hyperlipidemia     calcium -vitamin D  (OSCAL WITH D) 500-200 MG-UNIT per tablet Take 1 tablet by mouth daily with breakfast.     capecitabine  (XELODA ) 500 MG tablet Take 2 tablets (1000 mg total) by mouth 2 (two) times daily on days of radiation, Monday through Friday. Take within 30 minutes after meals. 60 tablet 1   cholecalciferol  (VITAMIN D ) 1000 UNITS tablet Take 1,000 Units by mouth daily.     docusate sodium  (COLACE) 100 MG capsule Take 1 capsule (100 mg total) by mouth 2 (two) times daily. 10 capsule 0   methocarbamol  (ROBAXIN ) 500 MG tablet Take 1 tablet by mouth 3 (three) times daily as needed. spasms  0   misoprostol  (CYTOTEC ) 200 MCG tablet Place 1 tablet (200 mcg total) vaginally once. Insert 12 hours vaginally prior to procedure 1 tablet 0   naproxen sodium (ALEVE) 220 MG tablet Take 220 mg by mouth daily as needed.     oxyCODONE -acetaminophen  (PERCOCET/ROXICET) 5-325 MG per tablet Take 1-2 tablets by mouth every 4 (four) hours as needed for moderate pain. 60 tablet 0   PRESCRIPTION MEDICATION Apply 1 application topically as needed (eczema on the ear).     No current facility-administered medications for this visit.    HISTORY OF PRESENT ILLNESS:   Oncology History  Adenocarcinoma of rectum (HCC)  06/17/2024 Initial Diagnosis   Adenocarcinoma of rectum (HCC)   06/25/2024 Cancer Staging   Staging form: Colon and Rectum, AJCC 8th Edition - Clinical stage from 06/25/2024: Stage IIA (cT3, cN0, cM0) - Signed  by Lanny Callander, MD on 06/29/2024 Total positive nodes: 0 Histologic grade (G): G2 Histologic grading system: 4 grade system       REVIEW OF SYSTEMS:   Constitutional: Denies fevers, chills or abnormal weight loss Eyes: Denies blurriness of vision Ears, nose, mouth, throat, and face: Denies mucositis or sore throat Respiratory: Denies cough, dyspnea or wheezes Cardiovascular: Denies palpitation, chest discomfort or lower extremity swelling Gastrointestinal:  Denies nausea, heartburn or change in bowel habits Skin: Denies abnormal skin rashes Lymphatics: Denies new lymphadenopathy or easy bruising Neurological:Denies numbness, tingling or new weaknesses Behavioral/Psych: Mood is stable, no new changes  All other systems were reviewed with the patient and are negative.   VITALS:   Today's Vitals   07/20/24 0859 07/20/24 0900  BP: (!) 135/57   Pulse: 80   Resp: 16   Temp: 97.6 F (36.4 C)   TempSrc: Temporal   SpO2: 95%   Weight: 146 lb 6.4 oz (66.4 kg)   PainSc:  0-No pain   Body mass index is 26.56 kg/m.   Wt Readings from Last 3 Encounters:  07/20/24 146 lb 6.4 oz (66.4 kg)  07/13/24 148 lb 8 oz (67.4 kg)  07/07/24 147 lb (66.7 kg)    Body mass index is 26.56 kg/m.  Performance status (ECOG): 1 - Symptomatic but completely ambulatory  PHYSICAL EXAM:   GENERAL:alert, no distress and comfortable SKIN: skin color, texture, turgor are normal, no rashes or significant lesions EYES: normal, Conjunctiva are pink and non-injected, sclera clear OROPHARYNX:no exudate, no erythema and lips, buccal mucosa, and tongue normal  NECK: supple, thyroid normal size, non-tender, without nodularity LYMPH:  no palpable lymphadenopathy in the cervical, axillary or inguinal LUNGS: clear to auscultation and percussion with normal breathing effort HEART: regular rate & rhythm and no murmurs and no lower extremity edema ABDOMEN:abdomen soft, non-tender and normal bowel  sounds Musculoskeletal:no cyanosis of digits and no clubbing  NEURO: alert & oriented x 3 with fluent speech, no focal motor/sensory deficits  LABORATORY DATA:  I have reviewed the data as listed    Component Value Date/Time   NA 143 07/20/2024 0844   K 4.3 07/20/2024 0844   CL 107 07/20/2024 0844   CO2 28 07/20/2024 0844   GLUCOSE 105 (H) 07/20/2024 0844   BUN 18 07/20/2024 0844   CREATININE 1.05 (H) 07/20/2024 0844   CALCIUM  9.1 07/20/2024 0844   PROT 7.3 07/20/2024 0844   ALBUMIN 4.1 07/20/2024 0844   AST 20 07/20/2024 0844   ALT 11 07/20/2024 0844   ALKPHOS 61 07/20/2024 0844   BILITOT 0.5 07/20/2024 0844   GFRNONAA 51 (L) 07/20/2024 0844   GFRAA >60 02/19/2015 1610   Lab Results  Component Value Date   WBC 5.9 07/20/2024   NEUTROABS 4.3 07/20/2024   HGB 13.8 07/20/2024   HCT 42.1 07/20/2024   MCV 92.9 07/20/2024   PLT 283 07/20/2024      "

## 2024-07-20 ENCOUNTER — Inpatient Hospital Stay: Admitting: Nurse Practitioner

## 2024-07-20 ENCOUNTER — Other Ambulatory Visit: Payer: Self-pay

## 2024-07-20 ENCOUNTER — Ambulatory Visit
Admission: RE | Admit: 2024-07-20 | Discharge: 2024-07-20 | Disposition: A | Discharge status: Home / Self Care | Source: Ambulatory Visit | Attending: Radiation Oncology | Admitting: Radiation Oncology

## 2024-07-20 ENCOUNTER — Inpatient Hospital Stay

## 2024-07-20 VITALS — BP 135/57 | HR 80 | Temp 97.6°F | Resp 16 | Wt 146.4 lb

## 2024-07-20 DIAGNOSIS — C20 Malignant neoplasm of rectum: Secondary | ICD-10-CM

## 2024-07-20 DIAGNOSIS — Z510 Encounter for antineoplastic radiation therapy: Secondary | ICD-10-CM | POA: Diagnosis not present

## 2024-07-20 LAB — CBC WITH DIFFERENTIAL (CANCER CENTER ONLY)
Abs Immature Granulocytes: 0.02 K/uL (ref 0.00–0.07)
Basophils Absolute: 0.1 K/uL (ref 0.0–0.1)
Basophils Relative: 1 %
Eosinophils Absolute: 0.2 K/uL (ref 0.0–0.5)
Eosinophils Relative: 4 %
HCT: 42.1 % (ref 36.0–46.0)
Hemoglobin: 13.8 g/dL (ref 12.0–15.0)
Immature Granulocytes: 0 %
Lymphocytes Relative: 15 %
Lymphs Abs: 0.9 K/uL (ref 0.7–4.0)
MCH: 30.5 pg (ref 26.0–34.0)
MCHC: 32.8 g/dL (ref 30.0–36.0)
MCV: 92.9 fL (ref 80.0–100.0)
Monocytes Absolute: 0.4 K/uL (ref 0.1–1.0)
Monocytes Relative: 6 %
Neutro Abs: 4.3 K/uL (ref 1.7–7.7)
Neutrophils Relative %: 74 %
Platelet Count: 283 K/uL (ref 150–400)
RBC: 4.53 MIL/uL (ref 3.87–5.11)
RDW: 12.9 % (ref 11.5–15.5)
WBC Count: 5.9 K/uL (ref 4.0–10.5)
nRBC: 0 % (ref 0.0–0.2)

## 2024-07-20 LAB — CMP (CANCER CENTER ONLY)
ALT: 11 U/L (ref 0–44)
AST: 20 U/L (ref 15–41)
Albumin: 4.1 g/dL (ref 3.5–5.0)
Alkaline Phosphatase: 61 U/L (ref 38–126)
Anion gap: 9 (ref 5–15)
BUN: 18 mg/dL (ref 8–23)
CO2: 28 mmol/L (ref 22–32)
Calcium: 9.1 mg/dL (ref 8.9–10.3)
Chloride: 107 mmol/L (ref 98–111)
Creatinine: 1.05 mg/dL — ABNORMAL HIGH (ref 0.44–1.00)
GFR, Estimated: 51 mL/min — ABNORMAL LOW
Glucose, Bld: 105 mg/dL — ABNORMAL HIGH (ref 70–99)
Potassium: 4.3 mmol/L (ref 3.5–5.1)
Sodium: 143 mmol/L (ref 135–145)
Total Bilirubin: 0.5 mg/dL (ref 0.0–1.2)
Total Protein: 7.3 g/dL (ref 6.5–8.1)

## 2024-07-20 LAB — RAD ONC ARIA SESSION SUMMARY
Course Elapsed Days: 7
Plan Fractions Treated to Date: 4
Plan Prescribed Dose Per Fraction: 1.8 Gy
Plan Total Fractions Prescribed: 25
Plan Total Prescribed Dose: 45 Gy
Reference Point Dosage Given to Date: 7.2 Gy
Reference Point Session Dosage Given: 1.8 Gy
Session Number: 4

## 2024-07-20 LAB — FERRITIN: Ferritin: 176 ng/mL (ref 11–307)

## 2024-07-21 ENCOUNTER — Other Ambulatory Visit: Payer: Self-pay

## 2024-07-21 ENCOUNTER — Ambulatory Visit
Admission: RE | Admit: 2024-07-21 | Discharge: 2024-07-21 | Disposition: A | Discharge status: Home / Self Care | Source: Ambulatory Visit | Attending: Radiation Oncology | Admitting: Radiation Oncology

## 2024-07-21 ENCOUNTER — Other Ambulatory Visit (HOSPITAL_COMMUNITY): Payer: Self-pay

## 2024-07-21 DIAGNOSIS — Z510 Encounter for antineoplastic radiation therapy: Secondary | ICD-10-CM | POA: Diagnosis not present

## 2024-07-21 LAB — RAD ONC ARIA SESSION SUMMARY
Course Elapsed Days: 8
Plan Fractions Treated to Date: 5
Plan Prescribed Dose Per Fraction: 1.8 Gy
Plan Total Fractions Prescribed: 25
Plan Total Prescribed Dose: 45 Gy
Reference Point Dosage Given to Date: 9 Gy
Reference Point Session Dosage Given: 1.8 Gy
Session Number: 5

## 2024-07-22 ENCOUNTER — Ambulatory Visit
Admission: RE | Admit: 2024-07-22 | Discharge: 2024-07-22 | Disposition: A | Discharge status: Home / Self Care | Source: Ambulatory Visit | Attending: Radiation Oncology | Admitting: Radiation Oncology

## 2024-07-22 ENCOUNTER — Other Ambulatory Visit: Payer: Self-pay

## 2024-07-22 DIAGNOSIS — Z510 Encounter for antineoplastic radiation therapy: Secondary | ICD-10-CM | POA: Diagnosis not present

## 2024-07-22 LAB — RAD ONC ARIA SESSION SUMMARY
Course Elapsed Days: 9
Plan Fractions Treated to Date: 6
Plan Prescribed Dose Per Fraction: 1.8 Gy
Plan Total Fractions Prescribed: 25
Plan Total Prescribed Dose: 45 Gy
Reference Point Dosage Given to Date: 10.8 Gy
Reference Point Session Dosage Given: 1.8 Gy
Session Number: 6

## 2024-07-24 ENCOUNTER — Other Ambulatory Visit (HOSPITAL_COMMUNITY): Payer: Self-pay

## 2024-07-24 ENCOUNTER — Other Ambulatory Visit: Payer: Self-pay

## 2024-07-24 ENCOUNTER — Ambulatory Visit
Admission: RE | Admit: 2024-07-24 | Discharge: 2024-07-24 | Disposition: A | Discharge status: Home / Self Care | Source: Ambulatory Visit | Attending: Radiation Oncology | Admitting: Radiation Oncology

## 2024-07-24 ENCOUNTER — Ambulatory Visit
Admission: RE | Admit: 2024-07-24 | Discharge: 2024-07-24 | Disposition: A | Source: Ambulatory Visit | Attending: Radiation Oncology

## 2024-07-24 ENCOUNTER — Telehealth: Payer: Self-pay

## 2024-07-24 DIAGNOSIS — C20 Malignant neoplasm of rectum: Secondary | ICD-10-CM | POA: Insufficient documentation

## 2024-07-24 DIAGNOSIS — C19 Malignant neoplasm of rectosigmoid junction: Secondary | ICD-10-CM | POA: Insufficient documentation

## 2024-07-24 DIAGNOSIS — Z510 Encounter for antineoplastic radiation therapy: Secondary | ICD-10-CM | POA: Insufficient documentation

## 2024-07-24 LAB — RAD ONC ARIA SESSION SUMMARY
Course Elapsed Days: 11
Plan Fractions Treated to Date: 7
Plan Prescribed Dose Per Fraction: 1.8 Gy
Plan Total Fractions Prescribed: 25
Plan Total Prescribed Dose: 45 Gy
Reference Point Dosage Given to Date: 12.6 Gy
Reference Point Session Dosage Given: 1.8 Gy
Session Number: 7

## 2024-07-24 NOTE — Telephone Encounter (Signed)
 Oral Oncology Patient Advocate Encounter   Received notification that prior authorization renewal  for Capecitabine  is required.   PA submitted on 07/24/2024 Key [id number] Status is pending      Charlott Hamilton,  CPhT-Adv  she/her/hers [facility]  [facility] Pharm[facility] Services Pharmacy Technician Patient Advocate Specialist III [facility] Phone: [phone]  Fax: [phone] [email or web address]

## 2024-07-26 ENCOUNTER — Encounter: Payer: Self-pay | Admitting: Nurse Practitioner

## 2024-07-27 ENCOUNTER — Other Ambulatory Visit: Payer: Self-pay

## 2024-07-27 ENCOUNTER — Other Ambulatory Visit (HOSPITAL_COMMUNITY): Payer: Self-pay

## 2024-07-27 ENCOUNTER — Ambulatory Visit
Admission: RE | Admit: 2024-07-27 | Discharge: 2024-07-27 | Disposition: A | Discharge status: Home / Self Care | Source: Ambulatory Visit | Attending: Radiation Oncology | Admitting: Radiation Oncology

## 2024-07-27 ENCOUNTER — Inpatient Hospital Stay: Attending: Nurse Practitioner

## 2024-07-27 DIAGNOSIS — C20 Malignant neoplasm of rectum: Secondary | ICD-10-CM

## 2024-07-27 LAB — CMP (CANCER CENTER ONLY)
ALT: 10 U/L (ref 0–44)
AST: 20 U/L (ref 15–41)
Albumin: 4.1 g/dL (ref 3.5–5.0)
Alkaline Phosphatase: 60 U/L (ref 38–126)
Anion gap: 9 (ref 5–15)
BUN: 19 mg/dL (ref 8–23)
CO2: 27 mmol/L (ref 22–32)
Calcium: 9.1 mg/dL (ref 8.9–10.3)
Chloride: 106 mmol/L (ref 98–111)
Creatinine: 1.04 mg/dL — ABNORMAL HIGH (ref 0.44–1.00)
GFR, Estimated: 52 mL/min — ABNORMAL LOW
Glucose, Bld: 104 mg/dL — ABNORMAL HIGH (ref 70–99)
Potassium: 4.4 mmol/L (ref 3.5–5.1)
Sodium: 142 mmol/L (ref 135–145)
Total Bilirubin: 0.4 mg/dL (ref 0.0–1.2)
Total Protein: 7.2 g/dL (ref 6.5–8.1)

## 2024-07-27 LAB — CBC WITH DIFFERENTIAL (CANCER CENTER ONLY)
Abs Immature Granulocytes: 0.02 K/uL (ref 0.00–0.07)
Basophils Absolute: 0 K/uL (ref 0.0–0.1)
Basophils Relative: 1 %
Eosinophils Absolute: 0.2 K/uL (ref 0.0–0.5)
Eosinophils Relative: 4 %
HCT: 41.6 % (ref 36.0–46.0)
Hemoglobin: 13.9 g/dL (ref 12.0–15.0)
Immature Granulocytes: 0 %
Lymphocytes Relative: 14 %
Lymphs Abs: 0.8 K/uL (ref 0.7–4.0)
MCH: 30.8 pg (ref 26.0–34.0)
MCHC: 33.4 g/dL (ref 30.0–36.0)
MCV: 92.2 fL (ref 80.0–100.0)
Monocytes Absolute: 0.4 K/uL (ref 0.1–1.0)
Monocytes Relative: 8 %
Neutro Abs: 4 K/uL (ref 1.7–7.7)
Neutrophils Relative %: 73 %
Platelet Count: 227 K/uL (ref 150–400)
RBC: 4.51 MIL/uL (ref 3.87–5.11)
RDW: 13.3 % (ref 11.5–15.5)
WBC Count: 5.5 K/uL (ref 4.0–10.5)
nRBC: 0 % (ref 0.0–0.2)

## 2024-07-27 LAB — RAD ONC ARIA SESSION SUMMARY
Course Elapsed Days: 14
Plan Fractions Treated to Date: 8
Plan Prescribed Dose Per Fraction: 1.8 Gy
Plan Total Fractions Prescribed: 25
Plan Total Prescribed Dose: 45 Gy
Reference Point Dosage Given to Date: 14.4 Gy
Reference Point Session Dosage Given: 1.8 Gy
Session Number: 8

## 2024-07-27 LAB — FERRITIN: Ferritin: 172 ng/mL (ref 11–307)

## 2024-07-27 NOTE — Progress Notes (Signed)
 Specialty Pharmacy Refill Coordination Note  Lisa White is a 89 y.o. female contacted today regarding refills of specialty medication(s) Capecitabine  (XELODA )   Patient requested [name] at [facility] at [place] date: 07/28/24   Medication will be filled on: 07/27/24

## 2024-07-27 NOTE — Progress Notes (Signed)
 Specialty Pharmacy Ongoing Clinical Assessment Note  Lisa White is a 89 y.o. female who is being followed by the specialty pharmacy service for RxSp Oncology   Patient's specialty medication(s) reviewed today: Capecitabine  (XELODA )   Missed doses in the last 4 weeks: 0   Patient/Caregiver did not have any additional questions or concerns.   Therapeutic benefit summary: Unable to assess   Adverse events/side effects summary: No adverse events/side effects   Patient's therapy is appropriate to: Continue    Goals Addressed             This Visit's Progress    Stabilization of disease       Patient is unable to be assessed as therapy was recently initiated. Patient will maintain adherence.         Follow up: 3 months  [facility]

## 2024-07-27 NOTE — Telephone Encounter (Signed)
 Oral Oncology Patient Advocate Encounter  Prior Authorization for Capecitabine  has been approved it is covered under Medicare part B.   Patients co-pay is $46.80.     Charlott Hamilton,  CPhT-Adv  she/her/hers [facility]  [facility] Specialty Pharmacy Services Pharmacy Technician Patient Advocate Specialist III [facility] Phone: [phone]  Fax: [phone] [email or web address]

## 2024-07-28 ENCOUNTER — Other Ambulatory Visit: Payer: Self-pay

## 2024-07-28 ENCOUNTER — Ambulatory Visit: Admitting: Obstetrics and Gynecology

## 2024-07-28 ENCOUNTER — Ambulatory Visit
Admission: RE | Admit: 2024-07-28 | Discharge: 2024-07-28 | Disposition: A | Source: Ambulatory Visit | Attending: Radiation Oncology

## 2024-07-28 ENCOUNTER — Other Ambulatory Visit (HOSPITAL_COMMUNITY): Payer: Self-pay

## 2024-07-28 LAB — RAD ONC ARIA SESSION SUMMARY
Course Elapsed Days: 15
Plan Fractions Treated to Date: 9
Plan Prescribed Dose Per Fraction: 1.8 Gy
Plan Total Fractions Prescribed: 25
Plan Total Prescribed Dose: 45 Gy
Reference Point Dosage Given to Date: 16.2 Gy
Reference Point Session Dosage Given: 1.8 Gy
Session Number: 9

## 2024-07-29 ENCOUNTER — Other Ambulatory Visit: Payer: Self-pay

## 2024-07-29 ENCOUNTER — Ambulatory Visit
Admission: RE | Admit: 2024-07-29 | Discharge: 2024-07-29 | Disposition: A | Discharge status: Home / Self Care | Source: Ambulatory Visit | Attending: Radiation Oncology | Admitting: Radiation Oncology

## 2024-07-29 LAB — RAD ONC ARIA SESSION SUMMARY
Course Elapsed Days: 16
Plan Fractions Treated to Date: 10
Plan Prescribed Dose Per Fraction: 1.8 Gy
Plan Total Fractions Prescribed: 25
Plan Total Prescribed Dose: 45 Gy
Reference Point Dosage Given to Date: 18 Gy
Reference Point Session Dosage Given: 1.8 Gy
Session Number: 10

## 2024-07-30 ENCOUNTER — Other Ambulatory Visit: Payer: Self-pay

## 2024-07-30 ENCOUNTER — Ambulatory Visit
Admission: RE | Admit: 2024-07-30 | Discharge: 2024-07-30 | Disposition: A | Discharge status: Home / Self Care | Source: Ambulatory Visit | Attending: Radiation Oncology | Admitting: Radiation Oncology

## 2024-07-30 LAB — RAD ONC ARIA SESSION SUMMARY
Course Elapsed Days: 17
Plan Fractions Treated to Date: 11
Plan Prescribed Dose Per Fraction: 1.8 Gy
Plan Total Fractions Prescribed: 25
Plan Total Prescribed Dose: 45 Gy
Reference Point Dosage Given to Date: 19.8 Gy
Reference Point Session Dosage Given: 1.8 Gy
Session Number: 11

## 2024-07-31 ENCOUNTER — Ambulatory Visit
Admission: RE | Admit: 2024-07-31 | Discharge: 2024-07-31 | Disposition: A | Source: Ambulatory Visit | Attending: Radiation Oncology

## 2024-07-31 ENCOUNTER — Encounter: Payer: Self-pay | Admitting: Dietician

## 2024-07-31 ENCOUNTER — Other Ambulatory Visit: Payer: Self-pay

## 2024-07-31 ENCOUNTER — Encounter: Attending: Family Medicine | Admitting: Dietician

## 2024-07-31 DIAGNOSIS — R7303 Prediabetes: Secondary | ICD-10-CM | POA: Insufficient documentation

## 2024-07-31 LAB — RAD ONC ARIA SESSION SUMMARY
Course Elapsed Days: 18
Plan Fractions Treated to Date: 12
Plan Prescribed Dose Per Fraction: 1.8 Gy
Plan Total Fractions Prescribed: 25
Plan Total Prescribed Dose: 45 Gy
Reference Point Dosage Given to Date: 21.6 Gy
Reference Point Session Dosage Given: 1.8 Gy
Session Number: 12

## 2024-07-31 NOTE — Progress Notes (Signed)
 Class start Time: 1530   Class End Time: 1630  This was a class of 4 patients.  Patient was seen on 07/31/24 for the Diabetes Prevention Program course at [facility]. By the end of this session patients are able to complete the following objectives:   Learning Objectives: Reflect on lifestyle changes they have made since starting the DPP.  Set long-term goals to promote continued maintenance of lifestyle changes made during the program.   Goals:  Work toward reaching new long-term goals set during class.   Follow-Up Plan: Contact Lifestyle Coach with questions/concerns PRN.

## 2024-08-02 NOTE — Assessment & Plan Note (Signed)
 cT3bN0M0 stage IIa, MMR normal, G2 - Diagnosed in November 2025.  Patient presented with intermittent rectal bleeding.  Colonoscopy showed a partially circumferential 5 cm tumor in the mid to upper rectum.  Biopsy confirmed adenocarcinoma -CT staging scans negative for distant metastasis.  Pelvic MRI showed T3 pN0 disease. -she started concurrent chemoradiation on 07/13/2024, she is not a good candidate for total neoadjuvant due to her advanced age.

## 2024-08-03 ENCOUNTER — Inpatient Hospital Stay

## 2024-08-03 ENCOUNTER — Other Ambulatory Visit: Payer: Self-pay

## 2024-08-03 ENCOUNTER — Ambulatory Visit
Admission: RE | Admit: 2024-08-03 | Discharge: 2024-08-03 | Disposition: A | Source: Ambulatory Visit | Attending: Radiation Oncology

## 2024-08-03 ENCOUNTER — Inpatient Hospital Stay: Admitting: Hematology

## 2024-08-03 VITALS — BP 132/69 | HR 78 | Temp 97.6°F | Resp 17 | Wt 147.0 lb

## 2024-08-03 DIAGNOSIS — C20 Malignant neoplasm of rectum: Secondary | ICD-10-CM

## 2024-08-03 LAB — CMP (CANCER CENTER ONLY)
ALT: 12 U/L (ref 0–44)
AST: 21 U/L (ref 15–41)
Albumin: 3.9 g/dL (ref 3.5–5.0)
Alkaline Phosphatase: 59 U/L (ref 38–126)
Anion gap: 8 (ref 5–15)
BUN: 19 mg/dL (ref 8–23)
CO2: 27 mmol/L (ref 22–32)
Calcium: 8.8 mg/dL — ABNORMAL LOW (ref 8.9–10.3)
Chloride: 106 mmol/L (ref 98–111)
Creatinine: 1.01 mg/dL — ABNORMAL HIGH (ref 0.44–1.00)
GFR, Estimated: 54 mL/min — ABNORMAL LOW
Glucose, Bld: 115 mg/dL — ABNORMAL HIGH (ref 70–99)
Potassium: 4.4 mmol/L (ref 3.5–5.1)
Sodium: 142 mmol/L (ref 135–145)
Total Bilirubin: 0.5 mg/dL (ref 0.0–1.2)
Total Protein: 7 g/dL (ref 6.5–8.1)

## 2024-08-03 LAB — CBC WITH DIFFERENTIAL (CANCER CENTER ONLY)
Abs Immature Granulocytes: 0.03 K/uL (ref 0.00–0.07)
Basophils Absolute: 0 K/uL (ref 0.0–0.1)
Basophils Relative: 1 %
Eosinophils Absolute: 0.3 K/uL (ref 0.0–0.5)
Eosinophils Relative: 6 %
HCT: 40.1 % (ref 36.0–46.0)
Hemoglobin: 13.5 g/dL (ref 12.0–15.0)
Immature Granulocytes: 1 %
Lymphocytes Relative: 11 %
Lymphs Abs: 0.5 K/uL — ABNORMAL LOW (ref 0.7–4.0)
MCH: 31.3 pg (ref 26.0–34.0)
MCHC: 33.7 g/dL (ref 30.0–36.0)
MCV: 93 fL (ref 80.0–100.0)
Monocytes Absolute: 0.4 K/uL (ref 0.1–1.0)
Monocytes Relative: 9 %
Neutro Abs: 3.7 K/uL (ref 1.7–7.7)
Neutrophils Relative %: 72 %
Platelet Count: 185 K/uL (ref 150–400)
RBC: 4.31 MIL/uL (ref 3.87–5.11)
RDW: 13.9 % (ref 11.5–15.5)
WBC Count: 5 K/uL (ref 4.0–10.5)
nRBC: 0 % (ref 0.0–0.2)

## 2024-08-03 LAB — RAD ONC ARIA SESSION SUMMARY
Course Elapsed Days: 21
Plan Fractions Treated to Date: 13
Plan Prescribed Dose Per Fraction: 1.8 Gy
Plan Total Fractions Prescribed: 25
Plan Total Prescribed Dose: 45 Gy
Reference Point Dosage Given to Date: 23.4 Gy
Reference Point Session Dosage Given: 1.8 Gy
Session Number: 13

## 2024-08-03 LAB — FERRITIN: Ferritin: 218 ng/mL (ref 11–307)

## 2024-08-03 NOTE — Progress Notes (Signed)
 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  Patient presented with intermittent rectal bleeding.  Colonoscopy showed a partially circumferential 5 cm tumor in the mid to upper rectum.  Biopsy confirmed adenocarcinoma -CT staging scans negative for distant metastasis.  Pelvic MRI showed T3 pN0 disease. -she started concurrent chemoradiation on 07/13/2024, she is not a good candidate for total neoadjuvant due to her advanced age.  Assessment & Plan Adenocarcinoma of rectum Undergoing concurrent chemoradiation with capecitabine  and radiation therapy. Tolerating treatment well with preserved appetite, stable weight, and no significant skin breakdown or rectal pain. Bowel movements are mostly normal with decreased stool caliber, likely due to local inflammation from treatment, without evidence of obstruction, hematochezia, or significant difficulty. Laboratory values, including blood counts, renal, and hepatic function, remain within normal limits. - Continued concurrent chemoradiation as planned. - Continued capecitabine , two tablets twice daily after meals, five days per week. - Continued radiation therapy through the first week of February. - Monitored laboratory values weekly. - Scheduled follow-up in two weeks. - Instructed her to report increased side effects such as diarrhea or pain.  Treatment-induced diarrhea Experiencing intermittent loose stools and increased bowel movement frequency, without frank diarrhea, incontinence, urgency, dehydration, or significant gastrointestinal distress. Occasional  perianal moisture present. Has not required antidiarrheal medication. Symptoms remain mild and manageable. - Recommended loperamide if bowel movements exceed three to four per day or if stools become loose. - Provided education on appropriate loperamide use. - Instructed her to monitor bowel movement frequency and consistency. - Advised maintenance of perianal hygiene with body wipes after defecation.  PLAN - She is overall tolerating chemoradiation well, lab reviewed, will continue treatment - Lab weekly, follow-up in 2 weeks   SUMMARY OF ONCOLOGIC HISTORY: Oncology History  Adenocarcinoma of rectum (HCC)  06/17/2024 Initial Diagnosis   Adenocarcinoma of rectum (HCC)   06/25/2024 Cancer Staging   Staging form: Colon and Rectum, AJCC 8th Edition - Clinical stage from 06/25/2024: Stage IIA (cT3, cN0, cM0) - Signed by Lanny Callander, MD on 06/29/2024 Total positive nodes: 0 Histologic grade (G): G2 Histologic grading system: 4 grade system      Discussed the use of AI scribe software for clinical note transcription with the patient, who gave verbal consent to proceed.  History of Present Illness Lisa White is an 89 year old female with rectal cancer undergoing concurrent chemoradiation who presents for follow-up during active treatment.  She is taking oral capecitabine  with radiation and recently obtained a medication refill.  She has two to four bowel movements daily, with one recent day of up to eight stools. Most are normal in consistency with occasional loose stools. She denies frank diarrhea, rectal pain, skin breakdown, perianal discomfort, bleeding, straining, or difficulty with bowel movements. She uses body wipes for perianal hygiene and has no hydration issues.  She notes decreased stool caliber, now about the size of her finger, without obstructive symptoms or other changes in bowel habits. Appetite is good and weight is stable. She has no new concerning symptoms.      All other systems were reviewed with the patient and are negative.  MEDICAL  HISTORY:  Past Medical History:  Diagnosis Date   Arthritis    Hyperlipidemia    Hypertension     SURGICAL HISTORY: Past Surgical History:  Procedure Laterality Date   APPENDECTOMY     FEMUR IM NAIL Right 02/18/2015   Procedure: INTRAMEDULLARY (IM) NAIL FEMORAL;  Surgeon: Donnice Car, MD;  Location: [facility];  Service: Orthopedics;  Laterality: Right;   ORIF FEMUR FRACTURE Left 09/17/2013   Procedure: OPEN REDUCTION INTERNAL FIXATION (ORIF) DISTAL FEMUR FRACTURE;  Surgeon: Donnice JONETTA Car, MD;  Location: [facility];  Service: Orthopedics;  Laterality: Left;   TONSILLECTOMY      I have reviewed the social history and family history with the patient and they are unchanged from previous note.  ALLERGIES:  has no known allergies.  MEDICATIONS:  Current Outpatient Medications  Medication Sig Dispense Refill   acetaminophen  (TYLENOL ) 325 MG tablet Take 2 tablets (650 mg total) by mouth every 6 (six) hours as needed for mild pain (or Fever >/= 101).     alum & mag hydroxide-simeth (MAALOX/MYLANTA) 200-200-20 MG/5ML suspension Take 30 mLs by mouth every 4 (four) hours as needed for indigestion. 355 mL 0   amLODipine  (NORVASC ) 10 MG tablet Take 10 mg by mouth at bedtime. For HTN     aspirin  EC 325 MG EC tablet Take 1 tablet (325 mg total) by mouth daily with breakfast. 30 tablet 0   atorvastatin  (LIPITOR) 20 MG tablet Take 20 mg by mouth at bedtime. For hyperlipidemia     calcium -vitamin D  (OSCAL WITH D) 500-200 MG-UNIT per tablet Take 1 tablet by mouth daily with breakfast.     capecitabine  (XELODA ) 500 MG tablet Take 2 tablets (1000 mg total) by mouth 2 (two) times daily on days of radiation, Monday through Friday. Take within 30 minutes after meals. 60 tablet 1   cholecalciferol  (VITAMIN D ) 1000 UNITS tablet Take 1,000 Units by mouth daily.     docusate sodium  (COLACE) 100 MG capsule Take 1 capsule (100 mg total) by  mouth 2 (two) times daily. 10 capsule 0   methocarbamol  (ROBAXIN ) 500 MG tablet Take 1 tablet by mouth 3 (three) times daily as needed. spasms  0   misoprostol  (CYTOTEC ) 200 MCG tablet Place 1 tablet (200 mcg total) vaginally once. Insert 12 hours vaginally prior to procedure 1 tablet 0   naproxen sodium (ALEVE) 220 MG tablet Take 220 mg by mouth daily as needed.     oxyCODONE -acetaminophen  (PERCOCET/ROXICET) 5-325 MG per tablet Take 1-2 tablets by mouth every 4 (four) hours as needed for moderate pain. 60 tablet 0   PRESCRIPTION MEDICATION Apply 1 application topically as needed (eczema on the ear).     No current facility-administered medications for this visit.    PHYSICAL EXAMINATION: ECOG PERFORMANCE STATUS: 1 - Symptomatic but completely ambulatory  Vitals:   08/03/24 1058 08/03/24 1059  BP: (!) 140/69 132/69  Pulse: 78   Resp: 17   Temp: 97.6 F (36.4 C)   SpO2: 99%    Wt Readings from Last 3 Encounters:  08/03/24 147 lb (66.7 kg)  07/20/24 146 lb 6.4 oz (66.4 kg)  07/13/24 148 lb 8 oz (67.4 kg)     GENERAL:alert, no distress and comfortable SKIN: skin color, texture, turgor are normal, no rashes or significant lesions EYES: normal, Conjunctiva are pink and non-injected, sclera clear NECK: supple, thyroid normal size, non-tender, without nodularity LYMPH:  no palpable lymphadenopathy in the cervical, axillary  LUNGS: clear to auscultation and percussion with  normal breathing effort HEART: regular rate & rhythm and no murmurs and no lower extremity edema ABDOMEN:abdomen soft, non-tender and normal bowel sounds Musculoskeletal:no cyanosis of digits and no clubbing  NEURO: alert & oriented x 3 with fluent speech, no focal motor/sensory deficits  Physical Exam    LABORATORY DATA:  I have reviewed the data as listed    Latest Ref Rng & Units 08/03/2024    9:23 AM 07/27/2024   10:02 AM 07/20/2024    8:44 AM  CBC  WBC 4.0 - 10.5 K/uL 5.0  5.5  5.9   Hemoglobin 12.0 -  15.0 g/dL 86.4  86.0  86.1   Hematocrit 36.0 - 46.0 % 40.1  41.6  42.1   Platelets 150 - 400 K/uL 185  227  283         Latest Ref Rng & Units 08/03/2024    9:23 AM 07/27/2024   10:02 AM 07/20/2024    8:44 AM  CMP  Glucose 70 - 99 mg/dL 884  895  894   BUN 8 - 23 mg/dL 19  19  18    Creatinine 0.44 - 1.00 mg/dL 8.98  8.95  8.94   Sodium 135 - 145 mmol/L 142  142  143   Potassium 3.5 - 5.1 mmol/L 4.4  4.4  4.3   Chloride 98 - 111 mmol/L 106  106  107   CO2 22 - 32 mmol/L 27  27  28    Calcium  8.9 - 10.3 mg/dL 8.8  9.1  9.1   Total Protein 6.5 - 8.1 g/dL 7.0  7.2  7.3   Total Bilirubin 0.0 - 1.2 mg/dL 0.5  0.4  0.5   Alkaline Phos 38 - 126 U/L 59  60  61   AST 15 - 41 U/L 21  20  20    ALT 0 - 44 U/L 12  10  11        RADIOGRAPHIC STUDIES: I have personally reviewed the radiological images as listed and agreed with the findings in the report. No results found.    No orders of the defined types were placed in this encounter.  All questions were answered. The patient knows to call the clinic with any problems, questions or concerns. No barriers to learning was detected. The total time spent in the appointment was 25 minutes, including review of chart and various tests results, discussions about plan of care and coordination of care plan     Onita Mattock, MD 08/03/2024     "

## 2024-08-04 ENCOUNTER — Other Ambulatory Visit: Payer: Self-pay

## 2024-08-04 ENCOUNTER — Ambulatory Visit
Admission: RE | Admit: 2024-08-04 | Discharge: 2024-08-04 | Disposition: A | Source: Ambulatory Visit | Attending: Radiation Oncology

## 2024-08-04 LAB — RAD ONC ARIA SESSION SUMMARY
Course Elapsed Days: 22
Plan Fractions Treated to Date: 14
Plan Prescribed Dose Per Fraction: 1.8 Gy
Plan Total Fractions Prescribed: 25
Plan Total Prescribed Dose: 45 Gy
Reference Point Dosage Given to Date: 25.2 Gy
Reference Point Session Dosage Given: 1.8 Gy
Session Number: 14

## 2024-08-05 ENCOUNTER — Other Ambulatory Visit: Payer: Self-pay

## 2024-08-05 ENCOUNTER — Ambulatory Visit
Admission: RE | Admit: 2024-08-05 | Discharge: 2024-08-05 | Disposition: A | Discharge status: Home / Self Care | Source: Ambulatory Visit | Attending: Radiation Oncology | Admitting: Radiation Oncology

## 2024-08-05 LAB — RAD ONC ARIA SESSION SUMMARY
Course Elapsed Days: 23
Plan Fractions Treated to Date: 15
Plan Prescribed Dose Per Fraction: 1.8 Gy
Plan Total Fractions Prescribed: 25
Plan Total Prescribed Dose: 45 Gy
Reference Point Dosage Given to Date: 27 Gy
Reference Point Session Dosage Given: 1.8 Gy
Session Number: 15

## 2024-08-06 ENCOUNTER — Ambulatory Visit
Admission: RE | Admit: 2024-08-06 | Discharge: 2024-08-06 | Disposition: A | Source: Ambulatory Visit | Attending: Radiation Oncology

## 2024-08-06 ENCOUNTER — Other Ambulatory Visit: Payer: Self-pay

## 2024-08-06 LAB — RAD ONC ARIA SESSION SUMMARY
Course Elapsed Days: 24
Plan Fractions Treated to Date: 16
Plan Prescribed Dose Per Fraction: 1.8 Gy
Plan Total Fractions Prescribed: 25
Plan Total Prescribed Dose: 45 Gy
Reference Point Dosage Given to Date: 28.8 Gy
Reference Point Session Dosage Given: 1.8 Gy
Session Number: 16

## 2024-08-07 ENCOUNTER — Ambulatory Visit
Admission: RE | Admit: 2024-08-07 | Discharge: 2024-08-07 | Disposition: A | Discharge status: Home / Self Care | Source: Ambulatory Visit | Attending: Radiation Oncology | Admitting: Radiation Oncology

## 2024-08-07 ENCOUNTER — Other Ambulatory Visit: Payer: Self-pay

## 2024-08-07 LAB — RAD ONC ARIA SESSION SUMMARY
Course Elapsed Days: 25
Plan Fractions Treated to Date: 17
Plan Prescribed Dose Per Fraction: 1.8 Gy
Plan Total Fractions Prescribed: 25
Plan Total Prescribed Dose: 45 Gy
Reference Point Dosage Given to Date: 30.6 Gy
Reference Point Session Dosage Given: 1.8 Gy
Session Number: 17

## 2024-08-10 ENCOUNTER — Other Ambulatory Visit: Payer: Self-pay | Admitting: Hematology

## 2024-08-10 ENCOUNTER — Ambulatory Visit
Admission: RE | Admit: 2024-08-10 | Discharge: 2024-08-10 | Disposition: A | Source: Ambulatory Visit | Attending: Radiation Oncology

## 2024-08-10 ENCOUNTER — Inpatient Hospital Stay

## 2024-08-10 ENCOUNTER — Other Ambulatory Visit: Payer: Self-pay

## 2024-08-10 DIAGNOSIS — C20 Malignant neoplasm of rectum: Secondary | ICD-10-CM

## 2024-08-10 LAB — CMP (CANCER CENTER ONLY)
ALT: 13 U/L (ref 0–44)
AST: 20 U/L (ref 15–41)
Albumin: 4.1 g/dL (ref 3.5–5.0)
Alkaline Phosphatase: 60 U/L (ref 38–126)
Anion gap: 10 (ref 5–15)
BUN: 19 mg/dL (ref 8–23)
CO2: 26 mmol/L (ref 22–32)
Calcium: 8.8 mg/dL — ABNORMAL LOW (ref 8.9–10.3)
Chloride: 107 mmol/L (ref 98–111)
Creatinine: 1.05 mg/dL — ABNORMAL HIGH (ref 0.44–1.00)
GFR, Estimated: 51 mL/min — ABNORMAL LOW
Glucose, Bld: 101 mg/dL — ABNORMAL HIGH (ref 70–99)
Potassium: 3.8 mmol/L (ref 3.5–5.1)
Sodium: 142 mmol/L (ref 135–145)
Total Bilirubin: 0.5 mg/dL (ref 0.0–1.2)
Total Protein: 7.1 g/dL (ref 6.5–8.1)

## 2024-08-10 LAB — RAD ONC ARIA SESSION SUMMARY
Course Elapsed Days: 28
Plan Fractions Treated to Date: 18
Plan Prescribed Dose Per Fraction: 1.8 Gy
Plan Total Fractions Prescribed: 25
Plan Total Prescribed Dose: 45 Gy
Reference Point Dosage Given to Date: 32.4 Gy
Reference Point Session Dosage Given: 1.8 Gy
Session Number: 18

## 2024-08-10 LAB — CBC WITH DIFFERENTIAL (CANCER CENTER ONLY)
Abs Immature Granulocytes: 0.03 K/uL (ref 0.00–0.07)
Basophils Absolute: 0 K/uL (ref 0.0–0.1)
Basophils Relative: 1 %
Eosinophils Absolute: 0.4 K/uL (ref 0.0–0.5)
Eosinophils Relative: 8 %
HCT: 40.2 % (ref 36.0–46.0)
Hemoglobin: 13.6 g/dL (ref 12.0–15.0)
Immature Granulocytes: 1 %
Lymphocytes Relative: 9 %
Lymphs Abs: 0.5 K/uL — ABNORMAL LOW (ref 0.7–4.0)
MCH: 31.6 pg (ref 26.0–34.0)
MCHC: 33.8 g/dL (ref 30.0–36.0)
MCV: 93.3 fL (ref 80.0–100.0)
Monocytes Absolute: 0.5 K/uL (ref 0.1–1.0)
Monocytes Relative: 9 %
Neutro Abs: 3.9 K/uL (ref 1.7–7.7)
Neutrophils Relative %: 72 %
Platelet Count: 215 K/uL (ref 150–400)
RBC: 4.31 MIL/uL (ref 3.87–5.11)
RDW: 14.6 % (ref 11.5–15.5)
WBC Count: 5.4 K/uL (ref 4.0–10.5)
nRBC: 0 % (ref 0.0–0.2)

## 2024-08-10 LAB — FERRITIN: Ferritin: 303 ng/mL (ref 11–307)

## 2024-08-10 MED ORDER — CAPECITABINE 500 MG PO TABS
ORAL_TABLET | ORAL | 1 refills | Status: AC
  Filled 2024-08-10: qty 60, fill #0
  Filled 2024-08-12: qty 60, 21d supply, fill #0

## 2024-08-11 ENCOUNTER — Ambulatory Visit
Admission: RE | Admit: 2024-08-11 | Discharge: 2024-08-11 | Disposition: A | Source: Ambulatory Visit | Attending: Radiation Oncology

## 2024-08-11 ENCOUNTER — Other Ambulatory Visit: Payer: Self-pay

## 2024-08-11 LAB — RAD ONC ARIA SESSION SUMMARY
Course Elapsed Days: 29
Plan Fractions Treated to Date: 19
Plan Prescribed Dose Per Fraction: 1.8 Gy
Plan Total Fractions Prescribed: 25
Plan Total Prescribed Dose: 45 Gy
Reference Point Dosage Given to Date: 34.2 Gy
Reference Point Session Dosage Given: 1.8 Gy
Session Number: 19

## 2024-08-12 ENCOUNTER — Encounter: Payer: Self-pay | Admitting: Physical Therapy

## 2024-08-12 ENCOUNTER — Other Ambulatory Visit: Payer: Self-pay

## 2024-08-12 ENCOUNTER — Ambulatory Visit: Attending: Radiation Oncology | Admitting: Physical Therapy

## 2024-08-12 ENCOUNTER — Ambulatory Visit
Admission: RE | Admit: 2024-08-12 | Discharge: 2024-08-12 | Disposition: A | Source: Ambulatory Visit | Attending: Radiation Oncology

## 2024-08-12 DIAGNOSIS — C20 Malignant neoplasm of rectum: Secondary | ICD-10-CM | POA: Insufficient documentation

## 2024-08-12 DIAGNOSIS — M6281 Muscle weakness (generalized): Secondary | ICD-10-CM | POA: Insufficient documentation

## 2024-08-12 DIAGNOSIS — R2689 Other abnormalities of gait and mobility: Secondary | ICD-10-CM | POA: Diagnosis present

## 2024-08-12 LAB — RAD ONC ARIA SESSION SUMMARY
Course Elapsed Days: 30
Plan Fractions Treated to Date: 20
Plan Prescribed Dose Per Fraction: 1.8 Gy
Plan Total Fractions Prescribed: 25
Plan Total Prescribed Dose: 45 Gy
Reference Point Dosage Given to Date: 36 Gy
Reference Point Session Dosage Given: 1.8 Gy
Session Number: 20

## 2024-08-12 NOTE — Progress Notes (Signed)
 Specialty Pharmacy Refill Coordination Note  Lisa White is a 89 y.o. female contacted today regarding refills of specialty medication(s) Capecitabine  (XELODA )   Patient requested [name] at [facility] at [place] date: 08/20/24   Medication will be filled on: 08/19/24

## 2024-08-12 NOTE — Therapy (Signed)
 [facility] [address] [place], [place], [address] Phone: [phone]   Fax:  [phone]  Physical Therapy Treatment  Patient Details  Name: Lisa White MRN: [id number] Date of Birth: 1936-06-06 No data recorded  Encounter Date: 08/12/2024   PT End of Session - 08/12/24 1401     Visit Number 2    Date for Recertification  09/30/24    Authorization Type UHC medicare    Authorization - Visit Number 2    Progress Note Due on Visit 10    PT Start Time 1400    PT Stop Time 1440    PT Time Calculation (min) 40 min    Activity Tolerance Patient tolerated treatment well    Behavior During Therapy [facility] for tasks assessed/performed          Past Medical History:  Diagnosis Date   Arthritis    Hyperlipidemia    Hypertension     Past Surgical History:  Procedure Laterality Date   APPENDECTOMY     FEMUR IM NAIL Right 02/18/2015   Procedure: INTRAMEDULLARY (IM) NAIL FEMORAL;  Surgeon: Donnice Car, MD;  Location: [facility];  Service: Orthopedics;  Laterality: Right;   ORIF FEMUR FRACTURE Left 09/17/2013   Procedure: OPEN REDUCTION INTERNAL FIXATION (ORIF) DISTAL FEMUR FRACTURE;  Surgeon: Donnice JONETTA Car, MD;  Location: [facility];  Service: Orthopedics;  Laterality: Left;   TONSILLECTOMY       Problem List Patient Active Problem List   Diagnosis Date Noted   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   Acute blood loss anemia 09/26/2013   Hyperlipidemia    Hypertension    Femur fracture, right (HCC) 09/17/2013   RBBB 09/17/2013   Fall at home 09/17/2013   PCP: Lenora Harvey, MD   REFERRING PROVIDER: Lanell Donald Stagger, PA-C    REFERRING DIAG: C20 (ICD-10-CM) - Adenocarcinoma of rectum [facility])    THERAPY DIAG:  Other abnormalities of gait and mobility   Muscle weakness (generalized)   Rectal cancer (HCC)   Rationale for Evaluation and Treatment: Rehabilitation   ONSET DATE: 06/17/2024   SUBJECTIVE:                                                                                                                                                                                             SUBJECTIVE STATEMENT: I finish the radiation on 08/24/24.Bowel movements are not the right size. Wipes and sees a smear. I have a mucous on my pad. Patient has not seen blood in her pads. Size of stool is smaller.  Eval: concurrent chemoradiation with Xeloda , Clinical stage from 06/25/2024: Stage  IIA (cT3, cN0, cM0) Colonoscopy showed a partially circumferential 5 cm tumor in the mid to upper rectum. Radiation starts on 07/13/24.      PERTINENT HISTORY:  Medications for current condition: Chemotherapy Surgeries: Appendectomy Other: rectal cancer Sexual abuse: No   PAIN:  Are you having pain? No   PRECAUTIONS: Other: rectal cancer   RED FLAGS: None       WEIGHT BEARING RESTRICTIONS: No   FALLS:  Has patient fallen in last 6 months? Yes. Number of falls 1, tried to step over a drain by her car and did not clear it.    OCCUPATION: retired   ACTIVITY LEVEL : walking in the house   PLOF: Independent   PATIENT GOALS: pre care for radiation     BOWEL MOVEMENT: Pain with bowel movement: No Type of bowel movement:Type (Bristol Stool Scale) Type 4, Frequency daily, Strain no, and Splinting no Fully empty rectum: Yes:   Leakage: No                                                  Bowel urgency: no Pads: Yes: substance is coming from the rectum and does not feel it Fiber supplement/laxative No   URINATION: Pain with urination: No Fully empty bladder: Yes:                                           Post-void dribble: No Stream: Strong Urgency: No Frequency:during the day average                                                        Nocturia: No   Leakage: none 08/12/24: no urinary leakage Pads/briefs: Yes: for leakage of substance coming from the rectum   INTERCOURSE:             Ability to have vaginal  penetration Yes  Pain with intercourse: none Dryness: No Marinoff Scale: 0/3   PREGNANCY: Vaginal deliveries 2 Tearing No   PROLAPSE: None     OBJECTIVE:  Note: Objective measures were completed at Evaluation unless otherwise noted.   PATIENT SURVEYS:  PFIQ-7: 0 UIQ-7 0 CRAIG -7 0 POPIQ-7 0   COGNITION: Overall cognitive status: Within functional limits for tasks assessed                          SENSATION: Light touch: Appears intact     FUNCTIONAL TESTS:  5 times sit to stand: 16.86 sec 2 minute walk test: 276.2 feet TUG: 16.60 sec with using hands to get out of the chair Patient pulls herself up the steps; bedroom upstairs; husband and son lives with patient   GAIT: Assistive device utilized: Single point cane Comments: wide base of support, decreased heel strike   POSTURE: rounded shoulders, forward head, increased thoracic kyphosis, and wide base of support       LOWER EXTREMITY ROM:   Passive ROM Right eval Left eval  Hip flexion 110 110  Hip abduction 15 15  Hip internal rotation 10 10  Hip  external rotation 20 30   (Blank rows = not tested)   LOWER EXTREMITY MMT:   MMT Right eval Left eval  Hip flexion 4/5 4/5  Hip extension 4/5 4/5  Hip abduction 4/5 4/5  Hip adduction 4/5 4/5  Knee flexion 5/5 5/5  Knee extension 5/5 5/5   (Blank rows = not tested) PALPATION: Pelvic Alignment: ASIS are equal   Abdominal:              Diastasis: No Distortion: No  Breathing: decreased movement of the lower rib cage                 Patient confirms identification and approves PT to assess internal pelvic floor and treatment No not at this time due to active cancer in the rectal area     PELVIC MMT:   MMT eval  Vaginal    Internal Anal Sphincter    External Anal Sphincter    Puborectalis    (Blank rows = not tested)           TODAY'S TREATMENT:  08/12/24 Exercises: Strengthening: 2 minute walk test: : 264 feet with exertion level  7-8 Sit to stand 5 times : 18 sec; 13.20 sec; 11.36 sec Standing hip extension 2 x 10 holding onto the counter Standing hip abduction 2 x 10 holding onto the counter Minisquat holding onto the counter 10 x  TSelf-care: Discussed with patient on care of the perineal area and she is doing well with it Discussed with patient on walking program and she does her walking at home                                                                                                                                 DATE: 07/08/24  EVAL Examination completed, findings reviewed, pt educated on POC, HEP, and female pelvic floor anatomy, reasoning with pelvic floor assessment internally with pt consent. Pt motivated to participate in PT and agreeable to attempt recommendations.        PATIENT EDUCATION:  08/12/24 Education details: Access Code: [email or web address], perineal care, pelvic floor exercise, energy conservation Person educated: Patient Education method: Explanation, Demonstration, Tactile cues, Verbal cues, and Handouts Education comprehension: verbalized understanding, returned demonstration, verbal cues required, tactile cues required, and needs further education   HOME EXERCISE PROGRAM: 08/12/24 Access Code: [email or web address] URL: [email or web address] Date: 08/12/2024 Prepared by: Channing Pereyra  Exercises - Single Knee to Chest Stretch  - 1 x daily - 7 x weekly - 1 sets - 2 reps - 30 sec hold - Supine Butterfly Groin Stretch  - 1 x daily - 7 x weekly - 1 sets - 1 reps - 30 sec hold - Supine Hamstring Stretch with Strap  - 1 x daily - 7 x weekly - 1 sets - 2 reps - 30 sec hold - Standing Hip Extension with Counter Support  - 1 x daily - 7 x weekly - 2  sets - 10 reps - Standing Hip Abduction with Counter Support  - 1 x daily - 7 x weekly - 2 sets - 10 reps - Mini Squat with Counter Support  - 1 x daily - 7 x weekly - 1 sets - 10 reps   ASSESSMENT:   CLINICAL IMPRESSION: Patient is a 89 y.o.  female who was seen today for physical therapy treatment for rectal cancer. Patient was diagnosis with rectal cancer on 06/17/24. She  will start chemotherapy and radiation on 07/13/24 till 08/24/24. Sit to stand at 13.20 sec compared to 16.86 sec. 2 minute walk test without cane was 264 feet. She is learning balance exercises for home. She is not ready for the dilator due to still having radiation.  Patient will benefit from skilled therapy to be educated on what to expect during the radiation, working on endurance and balance and pelvic floor exercises.      OBJECTIVE IMPAIRMENTS: difficulty walking and decreased strength.    ACTIVITY LIMITATIONS: toileting and locomotion level   PARTICIPATION LIMITATIONS: community activity   PERSONAL FACTORS: 1-2 comorbidities: rectal cancer are also affecting patient's functional outcome.    REHAB POTENTIAL: Excellent   CLINICAL DECISION MAKING: Evolving/moderate complexity   EVALUATION COMPLEXITY: Moderate     GOALS: Goals reviewed with patient? Yes   SHORT TERM GOALS: Target date: 08/05/24   Patient educated on how to take care of the perineal area while receiving radiation.  Baseline: Goal status: Met 08/12/24   2.  Patient educated on energy conservation while she is receiving radiation and chemotherapy.  Baseline:  Goal status: Met 08/12/24   3.  Patient educated on balance exercises to reduce risk of falling while walking to the bathroom.  Baseline:  Goal status: ongoing 08/12/14   4.  Patient educated on pelvic floor exercises to reduce risk of leaking stool as she is getting radiation.  Baseline:  Goal status: INITIAL   5.  2 minute walk test >/= 300 feet with exertion of 3.  Baseline:  Goal status: ongoing 08/12/24     LONG TERM GOALS: Target date: 09/30/24   Patient independent with advanced HEP for balance, strengthening, and flexibility exercises.  Baseline:  Goal status: INITIAL   2.  Patient educated on using a vaginal  dilator to keep the expansion of the vaginal canal for exams and penile penetration.  Baseline:  Goal status: INITIAL   3.  TUG score </= 13 seconds to improve her balance walking to the bathroom.  Baseline:  Goal status: INITIAL   4.  2 minute walk test >/= 350 feet due to her building her endurance and reduce risk of falls.  Baseline:  Goal status: INITIAL   5.  Patient is able to report no fecal leakage after she has received radiation and her stool consistency changes.  Baseline:  Goal status: INITIAL       PLAN:   PT FREQUENCY: 1-2x/week   PT DURATION: 12 weeks   PLANNED INTERVENTIONS: 97110-Therapeutic exercises, 97530- Therapeutic activity, 97112- Neuromuscular re-education, 97535- Self Care, 02859- Manual therapy, [phone]- Gait training, Patient/Family education, Balance training, and Biofeedback   PLAN FOR NEXT SESSION: balance training, walk on treadmill, pelvic floor contraction   Channing Pereyra, PT 08/12/24 2:45 PM   [facility] [address] [place], [place], [address] Phone: [phone]   Fax:  [phone]  Name: Shterna Laramee MRN: [id number] Date of Birth: 1935-12-27

## 2024-08-13 ENCOUNTER — Ambulatory Visit
Admission: RE | Admit: 2024-08-13 | Discharge: 2024-08-13 | Disposition: A | Discharge status: Home / Self Care | Source: Ambulatory Visit | Attending: Radiation Oncology | Admitting: Radiation Oncology

## 2024-08-13 ENCOUNTER — Other Ambulatory Visit: Payer: Self-pay

## 2024-08-13 LAB — RAD ONC ARIA SESSION SUMMARY
Course Elapsed Days: 31
Plan Fractions Treated to Date: 21
Plan Prescribed Dose Per Fraction: 1.8 Gy
Plan Total Fractions Prescribed: 25
Plan Total Prescribed Dose: 45 Gy
Reference Point Dosage Given to Date: 37.8 Gy
Reference Point Session Dosage Given: 1.8 Gy
Session Number: 21

## 2024-08-14 ENCOUNTER — Ambulatory Visit
Admission: RE | Admit: 2024-08-14 | Discharge: 2024-08-14 | Disposition: A | Source: Ambulatory Visit | Attending: Radiation Oncology

## 2024-08-14 ENCOUNTER — Other Ambulatory Visit: Payer: Self-pay

## 2024-08-14 LAB — RAD ONC ARIA SESSION SUMMARY
Course Elapsed Days: 32
Plan Fractions Treated to Date: 22
Plan Prescribed Dose Per Fraction: 1.8 Gy
Plan Total Fractions Prescribed: 25
Plan Total Prescribed Dose: 45 Gy
Reference Point Dosage Given to Date: 39.6 Gy
Reference Point Session Dosage Given: 1.8 Gy
Session Number: 22

## 2024-08-17 ENCOUNTER — Inpatient Hospital Stay: Admitting: Hematology

## 2024-08-17 ENCOUNTER — Inpatient Hospital Stay

## 2024-08-17 ENCOUNTER — Ambulatory Visit: Admitting: Physical Therapy

## 2024-08-17 ENCOUNTER — Ambulatory Visit

## 2024-08-18 ENCOUNTER — Ambulatory Visit: Admitting: Physical Therapy

## 2024-08-18 ENCOUNTER — Ambulatory Visit

## 2024-08-18 DIAGNOSIS — C20 Malignant neoplasm of rectum: Secondary | ICD-10-CM

## 2024-08-18 DIAGNOSIS — R2689 Other abnormalities of gait and mobility: Secondary | ICD-10-CM

## 2024-08-18 DIAGNOSIS — M6281 Muscle weakness (generalized): Secondary | ICD-10-CM

## 2024-08-18 NOTE — Therapy (Signed)
 [facility] [address] [place], [place], [address] Phone: [phone]   Fax:  [phone]  Physical Therapy Treatment  Patient Details  Name: Lisa White MRN: [id number] Date of Birth: 09-Aug-1935 No data recorded  Encounter Date: 08/18/2024    PT End of Session - 08/18/24 1051     Visit Number 3    Date for Recertification  09/30/24    Authorization Type [employer] medicare    Authorization - Visit Number 3    Progress Note Due on Visit 10    PT Start Time 1051    PT Stop Time 1130    PT Time Calculation (min) 39 min    Activity Tolerance Patient tolerated treatment well    Behavior During Therapy [facility] for tasks assessed/performed            Past Medical History:  Diagnosis Date   Arthritis    Hyperlipidemia    Hypertension     Past Surgical History:  Procedure Laterality Date   APPENDECTOMY     FEMUR IM NAIL Right 02/18/2015   Procedure: INTRAMEDULLARY (IM) NAIL FEMORAL;  Surgeon: Donnice Car, MD;  Location: [facility];  Service: Orthopedics;  Laterality: Right;   ORIF FEMUR FRACTURE Left 09/17/2013   Procedure: OPEN REDUCTION INTERNAL FIXATION (ORIF) DISTAL FEMUR FRACTURE;  Surgeon: Donnice JONETTA Car, MD;  Location: [facility];  Service: Orthopedics;  Laterality: Left;   TONSILLECTOMY       Problem List Patient Active Problem List   Diagnosis Date Noted   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   Acute blood loss anemia 09/26/2013   Hyperlipidemia    Hypertension    Femur fracture, right (HCC) 09/17/2013   RBBB 09/17/2013   Fall at home 09/17/2013   PCP: Lenora Harvey, MD   REFERRING PROVIDER: Lanell Donald Stagger, PA-C    REFERRING DIAG: C20 (ICD-10-CM) - Adenocarcinoma of rectum [facility])    THERAPY DIAG:  Other abnormalities of gait and mobility   Muscle weakness (generalized)   Rectal cancer (HCC)   Rationale for Evaluation and Treatment: Rehabilitation   ONSET DATE: 06/17/2024   SUBJECTIVE:                                                                                                                                                                                             SUBJECTIVE STATEMENT: Symptoms about the same.   Last visit:  I finish the radiation on 08/24/24.Bowel movements are not the right size. Wipes and sees a smear. I have a mucous on my pad. Patient has not seen blood in her pads. Size of stool is  smaller.  Eval: concurrent chemoradiation with Xeloda , Clinical stage from 06/25/2024: Stage IIA (cT3, cN0, cM0) Colonoscopy showed a partially circumferential 5 cm tumor in the mid to upper rectum. Radiation starts on 07/13/24.      PERTINENT HISTORY:  Medications for current condition: Chemotherapy Surgeries: Appendectomy Other: rectal cancer Sexual abuse: No   PAIN:  Are you having pain? No   PRECAUTIONS: Other: rectal cancer   RED FLAGS: None       WEIGHT BEARING RESTRICTIONS: No   FALLS:  Has patient fallen in last 6 months? Yes. Number of falls 1, tried to step over a drain by her car and did not clear it.    OCCUPATION: retired   ACTIVITY LEVEL : walking in the house   PLOF: Independent   PATIENT GOALS: pre care for radiation     BOWEL MOVEMENT: Pain with bowel movement: No Type of bowel movement:Type (Bristol Stool Scale) Type 4, Frequency daily, Strain no, and Splinting no Fully empty rectum: Yes:   Leakage: No                                                  Bowel urgency: no Pads: Yes: substance is coming from the rectum and does not feel it Fiber supplement/laxative No   URINATION: Pain with urination: No Fully empty bladder: Yes:                                           Post-void dribble: No Stream: Strong Urgency: No Frequency:during the day average                                                        Nocturia: No   Leakage: none 08/12/24: no urinary leakage Pads/briefs: Yes: for leakage of substance coming from the rectum    INTERCOURSE:             Ability to have vaginal penetration Yes  Pain with intercourse: none Dryness: No Marinoff Scale: 0/3   PREGNANCY: Vaginal deliveries 2 Tearing No   PROLAPSE: None     OBJECTIVE:  Note: Objective measures were completed at Evaluation unless otherwise noted.   PATIENT SURVEYS:  PFIQ-7: 0 UIQ-7 0 CRAIG -7 0 POPIQ-7 0   COGNITION: Overall cognitive status: Within functional limits for tasks assessed                          SENSATION: Light touch: Appears intact     FUNCTIONAL TESTS:  5 times sit to stand: 16.86 sec 2 minute walk test: 276.2 feet TUG: 16.60 sec with using hands to get out of the chair Patient pulls herself up the steps; bedroom upstairs; husband and son lives with patient   GAIT: Assistive device utilized: Single point cane Comments: wide base of support, decreased heel strike   POSTURE: rounded shoulders, forward head, increased thoracic kyphosis, and wide base of support       LOWER EXTREMITY ROM:   Passive ROM Right eval Left eval  Hip flexion 110 110  Hip abduction 15 15  Hip internal rotation 10 10  Hip external rotation 20 30   (Blank rows = not tested)   LOWER EXTREMITY MMT:   MMT Right eval Left eval  Hip flexion 4/5 4/5  Hip extension 4/5 4/5  Hip abduction 4/5 4/5  Hip adduction 4/5 4/5  Knee flexion 5/5 5/5  Knee extension 5/5 5/5   (Blank rows = not tested) PALPATION: Pelvic Alignment: ASIS are equal   Abdominal:              Diastasis: No Distortion: No  Breathing: decreased movement of the lower rib cage                 Patient confirms identification and approves PT to assess internal pelvic floor and treatment No not at this time due to active cancer in the rectal area     PELVIC MMT:   MMT eval  Vaginal    Internal Anal Sphincter    External Anal Sphincter    Puborectalis    (Blank rows = not tested)           TODAY'S TREATMENT:  08/18/24: Nustep L2 for 6 mins - PT  present to discuss progress  Standing in // yellow hip extension 2x10 each  minisquat holding onto the counter 2x10 Seated yellow band alt hip marching away from back of chair Farmer's carry 3# 50' x2 (each hand) moderate cues for posture Wide high steps in // 3x5 each leg rt/lt Sit to stands no hands x10 - moderate cues for posture   08/12/24 Exercises: Strengthening: 2 minute walk test: : 264 feet with exertion level 7-8 Sit to stand 5 times : 18 sec; 13.20 sec; 11.36 sec Standing hip extension 2 x 10 holding onto the counter Standing hip abduction 2 x 10 holding onto the counter Minisquat holding onto the counter 10 x  TSelf-care: Discussed with patient on care of the perineal area and she is doing well with it Discussed with patient on walking program and she does her walking at home                                                                                                                                 DATE: 07/08/24  EVAL Examination completed, findings reviewed, pt educated on POC, HEP, and female pelvic floor anatomy, reasoning with pelvic floor assessment internally with pt consent. Pt motivated to participate in PT and agreeable to attempt recommendations.        PATIENT EDUCATION:  08/12/24 Education details: Access Code: [email or web address], perineal care, pelvic floor exercise, energy conservation Person educated: Patient Education method: Explanation, Demonstration, Tactile cues, Verbal cues, and Handouts Education comprehension: verbalized understanding, returned demonstration, verbal cues required, tactile cues required, and needs further education   HOME EXERCISE PROGRAM: 08/12/24 Access Code: [email or web address] URL: [email or web address] Date: 08/12/2024 Prepared by: Channing Pereyra  Exercises - Single Knee to Chest Stretch  -  1 x daily - 7 x weekly - 1 sets - 2 reps - 30 sec hold - Supine Butterfly Groin Stretch  - 1 x daily - 7 x weekly - 1 sets - 1 reps - 30 sec  hold - Supine Hamstring Stretch with Strap  - 1 x daily - 7 x weekly - 1 sets - 2 reps - 30 sec hold - Standing Hip Extension with Counter Support  - 1 x daily - 7 x weekly - 2 sets - 10 reps - Standing Hip Abduction with Counter Support  - 1 x daily - 7 x weekly - 2 sets - 10 reps - Mini Squat with Counter Support  - 1 x daily - 7 x weekly - 1 sets - 10 reps   ASSESSMENT:   CLINICAL IMPRESSION: Patient is a 89 y.o. female who was seen today for physical therapy treatment for rectal cancer. Patient was diagnosis with rectal cancer on 06/17/24. She  will start chemotherapy and radiation on 07/13/24 till 08/24/24. Pt tolerated session well with cues for posture and techniques. Pt does demonstrate poor posture consistently but able to correct with cues.  Patient will benefit from skilled therapy to be educated on what to expect during the radiation, working on endurance and balance and pelvic floor exercises.      OBJECTIVE IMPAIRMENTS: difficulty walking and decreased strength.    ACTIVITY LIMITATIONS: toileting and locomotion level   PARTICIPATION LIMITATIONS: community activity   PERSONAL FACTORS: 1-2 comorbidities: rectal cancer are also affecting patient's functional outcome.    REHAB POTENTIAL: Excellent   CLINICAL DECISION MAKING: Evolving/moderate complexity   EVALUATION COMPLEXITY: Moderate     GOALS: Goals reviewed with patient? Yes   SHORT TERM GOALS: Target date: 08/05/24   Patient educated on how to take care of the perineal area while receiving radiation.  Baseline: Goal status: Met 08/12/24   2.  Patient educated on energy conservation while she is receiving radiation and chemotherapy.  Baseline:  Goal status: Met 08/12/24   3.  Patient educated on balance exercises to reduce risk of falling while walking to the bathroom.  Baseline:  Goal status: ongoing 08/12/14   4.  Patient educated on pelvic floor exercises to reduce risk of leaking stool as she is getting  radiation.  Baseline:  Goal status: INITIAL   5.  2 minute walk test >/= 300 feet with exertion of 3.  Baseline:  Goal status: ongoing 08/12/24     LONG TERM GOALS: Target date: 09/30/24   Patient independent with advanced HEP for balance, strengthening, and flexibility exercises.  Baseline:  Goal status: INITIAL   2.  Patient educated on using a vaginal dilator to keep the expansion of the vaginal canal for exams and penile penetration.  Baseline:  Goal status: INITIAL   3.  TUG score </= 13 seconds to improve her balance walking to the bathroom.  Baseline:  Goal status: INITIAL   4.  2 minute walk test >/= 350 feet due to her building her endurance and reduce risk of falls.  Baseline:  Goal status: INITIAL   5.  Patient is able to report no fecal leakage after she has received radiation and her stool consistency changes.  Baseline:  Goal status: INITIAL       PLAN:   PT FREQUENCY: 1-2x/week   PT DURATION: 12 weeks   PLANNED INTERVENTIONS: 97110-Therapeutic exercises, 97530- Therapeutic activity, [id number]- Neuromuscular re-education, 97535- Self Care, 02859- Manual therapy, [phone]- Gait training, Patient/Family education,  Balance training, and Biofeedback   PLAN FOR NEXT SESSION: balance training, walk on treadmill, pelvic floor contraction  Darryle Navy, PT, DPT 08/18/2609:32 AM  [facility] [address] [place], [place] [address] Phone # [phone] Fax [phone]

## 2024-08-19 ENCOUNTER — Ambulatory Visit
Admission: RE | Admit: 2024-08-19 | Discharge: 2024-08-19 | Disposition: A | Source: Ambulatory Visit | Attending: Radiation Oncology

## 2024-08-19 ENCOUNTER — Other Ambulatory Visit: Payer: Self-pay

## 2024-08-19 ENCOUNTER — Ambulatory Visit

## 2024-08-19 LAB — RAD ONC ARIA SESSION SUMMARY
Course Elapsed Days: 37
Plan Fractions Treated to Date: 23
Plan Prescribed Dose Per Fraction: 1.8 Gy
Plan Total Fractions Prescribed: 25
Plan Total Prescribed Dose: 45 Gy
Reference Point Dosage Given to Date: 41.4 Gy
Reference Point Session Dosage Given: 1.8 Gy
Session Number: 23

## 2024-08-20 ENCOUNTER — Other Ambulatory Visit: Payer: Self-pay

## 2024-08-20 ENCOUNTER — Ambulatory Visit

## 2024-08-20 ENCOUNTER — Inpatient Hospital Stay

## 2024-08-20 ENCOUNTER — Inpatient Hospital Stay (HOSPITAL_BASED_OUTPATIENT_CLINIC_OR_DEPARTMENT_OTHER): Admitting: Hematology

## 2024-08-20 ENCOUNTER — Ambulatory Visit
Admission: RE | Admit: 2024-08-20 | Discharge: 2024-08-20 | Disposition: A | Discharge status: Home / Self Care | Source: Ambulatory Visit | Attending: Radiation Oncology | Admitting: Radiation Oncology

## 2024-08-20 VITALS — BP 137/68 | HR 79 | Temp 98.7°F | Resp 18 | Ht 62.25 in | Wt 149.4 lb

## 2024-08-20 DIAGNOSIS — C20 Malignant neoplasm of rectum: Secondary | ICD-10-CM

## 2024-08-20 LAB — CBC WITH DIFFERENTIAL (CANCER CENTER ONLY)
Abs Immature Granulocytes: 0.04 10*3/uL (ref 0.00–0.07)
Basophils Absolute: 0 10*3/uL (ref 0.0–0.1)
Basophils Relative: 1 %
Eosinophils Absolute: 0.3 10*3/uL (ref 0.0–0.5)
Eosinophils Relative: 6 %
HCT: 39.6 % (ref 36.0–46.0)
Hemoglobin: 13.4 g/dL (ref 12.0–15.0)
Immature Granulocytes: 1 %
Lymphocytes Relative: 6 %
Lymphs Abs: 0.3 10*3/uL — ABNORMAL LOW (ref 0.7–4.0)
MCH: 31.8 pg (ref 26.0–34.0)
MCHC: 33.8 g/dL (ref 30.0–36.0)
MCV: 93.8 fL (ref 80.0–100.0)
Monocytes Absolute: 0.5 10*3/uL (ref 0.1–1.0)
Monocytes Relative: 9 %
Neutro Abs: 3.9 10*3/uL (ref 1.7–7.7)
Neutrophils Relative %: 77 %
Platelet Count: 210 10*3/uL (ref 150–400)
RBC: 4.22 MIL/uL (ref 3.87–5.11)
RDW: 16.1 % — ABNORMAL HIGH (ref 11.5–15.5)
WBC Count: 5 10*3/uL (ref 4.0–10.5)
nRBC: 0 % (ref 0.0–0.2)

## 2024-08-20 LAB — RAD ONC ARIA SESSION SUMMARY
Course Elapsed Days: 38
Plan Fractions Treated to Date: 24
Plan Prescribed Dose Per Fraction: 1.8 Gy
Plan Total Fractions Prescribed: 25
Plan Total Prescribed Dose: 45 Gy
Reference Point Dosage Given to Date: 43.2 Gy
Reference Point Session Dosage Given: 1.8 Gy
Session Number: 24

## 2024-08-20 LAB — CMP (CANCER CENTER ONLY)
ALT: 14 U/L (ref 0–44)
AST: 22 U/L (ref 15–41)
Albumin: 4.1 g/dL (ref 3.5–5.0)
Alkaline Phosphatase: 63 U/L (ref 38–126)
Anion gap: 11 (ref 5–15)
BUN: 23 mg/dL (ref 8–23)
CO2: 26 mmol/L (ref 22–32)
Calcium: 9 mg/dL (ref 8.9–10.3)
Chloride: 106 mmol/L (ref 98–111)
Creatinine: 1.09 mg/dL — ABNORMAL HIGH (ref 0.44–1.00)
GFR, Estimated: 49 mL/min — ABNORMAL LOW
Glucose, Bld: 110 mg/dL — ABNORMAL HIGH (ref 70–99)
Potassium: 4 mmol/L (ref 3.5–5.1)
Sodium: 143 mmol/L (ref 135–145)
Total Bilirubin: 0.6 mg/dL (ref 0.0–1.2)
Total Protein: 7.1 g/dL (ref 6.5–8.1)

## 2024-08-20 LAB — FERRITIN: Ferritin: 291 ng/mL (ref 11–307)

## 2024-08-20 NOTE — Assessment & Plan Note (Signed)
 cT3bN0M0 stage IIa, MMR normal, G2 - Diagnosed in November 2025.  Patient presented with intermittent rectal bleeding.  Colonoscopy showed a partially circumferential 5 cm tumor in the mid to upper rectum.  Biopsy confirmed adenocarcinoma -CT staging scans negative for distant metastasis.  Pelvic MRI showed T3 pN0 disease. -she started concurrent chemoradiation on 07/13/2024, she is not a good candidate for total neoadjuvant due to her advanced age.

## 2024-08-20 NOTE — Progress Notes (Signed)
 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  Patient presented with intermittent rectal bleeding.  Colonoscopy showed a partially circumferential 5 cm tumor in the mid to upper rectum.  Biopsy confirmed adenocarcinoma -CT staging scans negative for distant metastasis.  Pelvic MRI showed T3 pN0 disease. -she started concurrent chemoradiation on 07/13/2024, she is not a good candidate for total neoadjuvant due to her advanced age.  Assessment & Plan Adenocarcinoma of rectum She is undergoing definitive concurrent chemoradiation for newly diagnosed rectal adenocarcinoma. She has tolerated treatment well, with only mild diarrhea and decreased stool caliber, and no significant pain, bleeding, or laboratory abnormalities. She is not a candidate for total neoadjuvant therapy or upfront surgery due to advanced age. The therapeutic goal is organ preservation and quality of life. Prognosis is limited by age and comorbidities. Best response is expected in approximately three months, with interim assessment planned sooner. - Complete current course of chemoradiation (last radiation session August 26, 2024). - Discontinue capecitabine  after completion of radiation. - Refer to colorectal surgeon (Dr. Sheldon) for post-treatment examination in 1 month (end of February or early March). - Order MRI scan in approximately two months post-treatment to assess response. - Due to her advanced age, I do not think she is a candidate for FOLFOX  Treatment-induced diarrhea She has experienced mild, intermittent diarrhea, a common side effect toward the end of  radiation therapy. Diarrhea has not been severe and is managed symptomatically. No associated rectal pain, bleeding, or skin breakdown. - Maintain perianal hygiene after each bowel movement to prevent skin irritation.  Plan -She is tolerating chemoradiation well, will continue and complete next Wednesday. - Lab and follow-up in 3 to 4 weeks - She will follow-up with Dr. Sheldon in a month, I will message Dr. Sheldon. - Continue pelvic PT.     SUMMARY OF ONCOLOGIC HISTORY: Oncology History  Adenocarcinoma of rectum (HCC)  06/17/2024 Initial Diagnosis   Adenocarcinoma of rectum (HCC)   06/25/2024 Cancer Staging   Staging form: Colon and Rectum, AJCC 8th Edition - Clinical stage from 06/25/2024: Stage IIA (cT3, cN0, cM0) - Signed by Lanny Callander, MD on 06/29/2024 Total positive nodes: 0 Histologic grade (G): G2 Histologic grading system: 4 grade system      Discussed the use of AI scribe software for clinical note transcription with the patient, who gave verbal consent to proceed.  History of Present Illness Lisa White is an 89 year old female with stage IIa rectal adenocarcinoma who presents for follow-up to assess treatment tolerance during concurrent chemoradiation.  She is in the final week of a six-week course of concurrent chemoradiation with oral capecitabine  twice daily and pelvic external beam radiation. She has not had chemotherapy-related adverse reactions. She notes mild intermittent diarrhea, worse than baseline but not severe, with overall satisfactory bowel movements. She reports decreased stool caliber to about finger diameter without difficulty defecating, rectal pain, or rectal bleeding. She maintains perianal hygiene with no skin irritation.  She has no rectal pain. She uses oxycodone  as needed for pain and has not used morphine . Recent complete blood count and renal and hepatic function are within normal limits.  She  is focused on longevity and is concerned about  recurrent hospitalizations for infection and pain given prior admissions for urinary tract infection and post-chemotherapy pain. She is increasing oral fluid intake, has good family support, and has a documented DNR order.  Aug 03, 2024: Follow-up during concurrent chemoradiation for stage IIa rectal adenocarcinoma; patient tolerating capecitabine  and radiation well with mild, manageable treatment-induced diarrhea, stable appetite and weight, no significant skin breakdown or rectal pain, and normal laboratory values. Bowel movements mostly normal with decreased stool caliber, no obstruction or hematochezia. Continued chemoradiation as planned, with ongoing monitoring and supportive care.     All other systems were reviewed with the patient and are negative.  MEDICAL HISTORY:  Past Medical History:  Diagnosis Date   Arthritis    Hyperlipidemia    Hypertension     SURGICAL HISTORY: Past Surgical History:  Procedure Laterality Date   APPENDECTOMY     FEMUR IM NAIL Right 02/18/2015   Procedure: INTRAMEDULLARY (IM) NAIL FEMORAL;  Surgeon: Donnice Car, MD;  Location: [facility];  Service: Orthopedics;  Laterality: Right;   ORIF FEMUR FRACTURE Left 09/17/2013   Procedure: OPEN REDUCTION INTERNAL FIXATION (ORIF) DISTAL FEMUR FRACTURE;  Surgeon: Donnice JONETTA Car, MD;  Location: [facility];  Service: Orthopedics;  Laterality: Left;   TONSILLECTOMY      I have reviewed the social history and family history with the patient and they are unchanged from previous note.  ALLERGIES:  has no known allergies.  MEDICATIONS:  Current Outpatient Medications  Medication Sig Dispense Refill   acetaminophen  (TYLENOL ) 325 MG tablet Take 2 tablets (650 mg total) by mouth every 6 (six) hours as needed for mild pain (or Fever >/= 101).     alum & mag hydroxide-simeth (MAALOX/MYLANTA) 200-200-20 MG/5ML suspension Take 30 mLs by mouth every 4 (four) hours as needed for indigestion. 355 mL 0   amLODipine  (NORVASC ) 10 MG  tablet Take 10 mg by mouth at bedtime. For HTN     aspirin  EC 325 MG EC tablet Take 1 tablet (325 mg total) by mouth daily with breakfast. 30 tablet 0   atorvastatin  (LIPITOR) 20 MG tablet Take 20 mg by mouth at bedtime. For hyperlipidemia     calcium -vitamin D  (OSCAL WITH D) 500-200 MG-UNIT per tablet Take 1 tablet by mouth daily with breakfast.     capecitabine  (XELODA ) 500 MG tablet Take 2 tablets (1000 mg total) by mouth 2 (two) times daily on days of radiation, Monday through Friday. Take within 30 minutes after meals. 60 tablet 1   cholecalciferol  (VITAMIN D ) 1000 UNITS tablet Take 1,000 Units by mouth daily.     docusate sodium  (COLACE) 100 MG capsule Take 1 capsule (100 mg total) by mouth 2 (two) times daily. 10 capsule 0   methocarbamol  (ROBAXIN ) 500 MG tablet Take 1 tablet by mouth 3 (three) times daily as needed. spasms  0   misoprostol  (CYTOTEC ) 200 MCG tablet Place 1 tablet (200 mcg total) vaginally once. Insert 12 hours vaginally prior to procedure 1 tablet 0   naproxen sodium (ALEVE) 220 MG tablet Take 220 mg by mouth daily as needed.     oxyCODONE -acetaminophen  (PERCOCET/ROXICET) 5-325 MG per tablet Take 1-2 tablets by mouth every 4 (four) hours as needed for moderate pain. 60 tablet 0   PRESCRIPTION MEDICATION Apply 1 application topically as needed (eczema on the ear).     No current facility-administered medications for this visit.    PHYSICAL EXAMINATION: ECOG PERFORMANCE STATUS: 1 - Symptomatic but  completely ambulatory  Vitals:   08/20/24 1000 08/20/24 1047  BP: (!) 150/71 137/68  Pulse: 82 79  Resp: 18   Temp: 98.7 F (37.1 C)   SpO2: 97% 97%   Wt Readings from Last 3 Encounters:  08/20/24 149 lb 6.4 oz (67.8 kg)  08/03/24 147 lb (66.7 kg)  07/20/24 146 lb 6.4 oz (66.4 kg)     GENERAL:alert, no distress and comfortable SKIN: skin color, texture, turgor are normal, no rashes or significant lesions EYES: normal, Conjunctiva are pink and non-injected, sclera  clear NECK: supple, thyroid normal size, non-tender, without nodularity LYMPH:  no palpable lymphadenopathy in the cervical, axillary  LUNGS: clear to auscultation and percussion with normal breathing effort HEART: regular rate & rhythm and no murmurs and no lower extremity edema ABDOMEN:abdomen soft, non-tender and normal bowel sounds Musculoskeletal:no cyanosis of digits and no clubbing  NEURO: alert & oriented x 3 with fluent speech, no focal motor/sensory deficits  Physical Exam SKIN: Normal  LABORATORY DATA:  I have reviewed the data as listed    Latest Ref Rng & Units 08/20/2024    9:29 AM 08/10/2024    9:55 AM 08/03/2024    9:23 AM  CBC  WBC 4.0 - 10.5 K/uL 5.0  5.4  5.0   Hemoglobin 12.0 - 15.0 g/dL 86.5  86.3  86.4   Hematocrit 36.0 - 46.0 % 39.6  40.2  40.1   Platelets 150 - 400 K/uL 210  215  185         Latest Ref Rng & Units 08/20/2024    9:29 AM 08/10/2024    9:55 AM 08/03/2024    9:23 AM  CMP  Glucose 70 - 99 mg/dL 889  898  884   BUN 8 - 23 mg/dL 23  19  19    Creatinine 0.44 - 1.00 mg/dL 8.90  8.94  8.98   Sodium 135 - 145 mmol/L 143  142  142   Potassium 3.5 - 5.1 mmol/L 4.0  3.8  4.4   Chloride 98 - 111 mmol/L 106  107  106   CO2 22 - 32 mmol/L 26  26  27    Calcium  8.9 - 10.3 mg/dL 9.0  8.8  8.8   Total Protein 6.5 - 8.1 g/dL 7.1  7.1  7.0   Total Bilirubin 0.0 - 1.2 mg/dL 0.6  0.5  0.5   Alkaline Phos 38 - 126 U/L 63  60  59   AST 15 - 41 U/L 22  20  21    ALT 0 - 44 U/L 14  13  12        RADIOGRAPHIC STUDIES: I have personally reviewed the radiological images as listed and agreed with the findings in the report. No results found.    No orders of the defined types were placed in this encounter.  All questions were answered. The patient knows to call the clinic with any problems, questions or concerns. No barriers to learning was detected. The total time spent in the appointment was 25 minutes, including review of chart and various tests results,  discussions about plan of care and coordination of care plan     Onita Mattock, MD 08/20/2024     "

## 2024-08-21 ENCOUNTER — Other Ambulatory Visit: Payer: Self-pay

## 2024-08-21 ENCOUNTER — Ambulatory Visit

## 2024-08-21 ENCOUNTER — Ambulatory Visit: Admission: RE | Admit: 2024-08-21 | Source: Ambulatory Visit

## 2024-08-21 LAB — RAD ONC ARIA SESSION SUMMARY
Course Elapsed Days: 39
Plan Fractions Treated to Date: 25
Plan Prescribed Dose Per Fraction: 1.8 Gy
Plan Total Fractions Prescribed: 25
Plan Total Prescribed Dose: 45 Gy
Reference Point Dosage Given to Date: 45 Gy
Reference Point Session Dosage Given: 1.8 Gy
Session Number: 25

## 2024-08-24 ENCOUNTER — Ambulatory Visit

## 2024-08-24 ENCOUNTER — Other Ambulatory Visit (HOSPITAL_COMMUNITY): Payer: Self-pay

## 2024-08-25 ENCOUNTER — Other Ambulatory Visit: Payer: Self-pay

## 2024-08-25 ENCOUNTER — Ambulatory Visit

## 2024-08-25 ENCOUNTER — Ambulatory Visit: Admission: RE | Admit: 2024-08-25 | Discharge: 2024-08-25 | Attending: Radiation Oncology

## 2024-08-25 LAB — RAD ONC ARIA SESSION SUMMARY
Course Elapsed Days: 43
Plan Fractions Treated to Date: 1
Plan Prescribed Dose Per Fraction: 1.8 Gy
Plan Total Fractions Prescribed: 3
Plan Total Prescribed Dose: 5.4 Gy
Reference Point Dosage Given to Date: 1.8 Gy
Reference Point Session Dosage Given: 1.8 Gy
Session Number: 26

## 2024-08-26 ENCOUNTER — Ambulatory Visit: Admission: RE | Admit: 2024-08-26 | Discharge: 2024-08-26 | Attending: Radiation Oncology

## 2024-08-26 ENCOUNTER — Other Ambulatory Visit: Payer: Self-pay

## 2024-08-26 ENCOUNTER — Ambulatory Visit

## 2024-08-26 LAB — RAD ONC ARIA SESSION SUMMARY
Course Elapsed Days: 44
Plan Fractions Treated to Date: 2
Plan Prescribed Dose Per Fraction: 1.8 Gy
Plan Total Fractions Prescribed: 3
Plan Total Prescribed Dose: 5.4 Gy
Reference Point Dosage Given to Date: 3.6 Gy
Reference Point Session Dosage Given: 1.8 Gy
Session Number: 27

## 2024-08-27 ENCOUNTER — Ambulatory Visit: Admission: RE | Admit: 2024-08-27 | Discharge: 2024-08-27 | Attending: Radiation Oncology

## 2024-08-27 ENCOUNTER — Other Ambulatory Visit: Payer: Self-pay

## 2024-08-27 LAB — RAD ONC ARIA SESSION SUMMARY
Course Elapsed Days: 45
Plan Fractions Treated to Date: 3
Plan Prescribed Dose Per Fraction: 1.8 Gy
Plan Total Fractions Prescribed: 3
Plan Total Prescribed Dose: 5.4 Gy
Reference Point Dosage Given to Date: 5.4 Gy
Reference Point Session Dosage Given: 1.8 Gy
Session Number: 28

## 2024-08-28 ENCOUNTER — Ambulatory Visit

## 2024-08-28 NOTE — Radiation Completion Notes (Signed)
 Patient Name: Lisa White, Lisa White MRN: [id number] Date of Birth: [birth year] Referring Physician: ONITA MATTOCK, M.D. Date of Service: 2024-08-28 Radiation Oncologist: Norleen Limes, M.D. [facility]                             RADIATION ONCOLOGY END OF TREATMENT NOTE     Diagnosis: C20 Malignant neoplasm of rectum Staging on 2024-06-25: Adenocarcinoma of rectum (HCC) T=cT3, N=cN0, M=cM0 Intent: Curative     ==========DELIVERED PLANS==========  First Treatment Date: 2024-07-13 Last Treatment Date: 2024-08-27   Plan Name: Rectum Site: Rectum Technique: 3D Mode: Photon Dose Per Fraction: 1.8 Gy Prescribed Dose (Delivered / Prescribed): 45 Gy / 45 Gy Prescribed Fxs (Delivered / Prescribed): 25 / 25   Plan Name: Rectum_Bst Site: Rectum Technique: 3D Mode: Photon Dose Per Fraction: 1.8 Gy Prescribed Dose (Delivered / Prescribed): 5.4 Gy / 5.4 Gy Prescribed Fxs (Delivered / Prescribed): 3 / 3     ==========ON TREATMENT VISIT DATES========== 2024-07-15, 2024-07-24, 2024-07-31, 2024-08-07, 2024-08-14     ==========UPCOMING VISITS========== 10/05/2024 [facility] PROCEDURE [facility] PROCED[facility]  09/28/2024 [facility] POST TREATMENT CALL [facility]  09/17/2024 [facility] EST PT 20 Mattock Onita, MD  09/17/2024 [facility]  09/07/2024 [facility] Elnor Channing FALCON, PT  08/31/2024 [facility] Russell Neptune A, PT        ==========APPENDIX - ON TREATMENT VISIT NOTES==========   See weekly On Treatment Notes in Epic for details in the Media tab (listed as Progress notes on the On Treatment Visit Dates listed above).

## 2024-08-31 ENCOUNTER — Ambulatory Visit

## 2024-09-07 ENCOUNTER — Ambulatory Visit: Payer: Self-pay | Admitting: Physical Therapy

## 2024-09-17 ENCOUNTER — Inpatient Hospital Stay: Admitting: Hematology

## 2024-09-17 ENCOUNTER — Inpatient Hospital Stay: Attending: Nurse Practitioner

## 2024-09-28 ENCOUNTER — Ambulatory Visit

## 2024-10-05 ENCOUNTER — Ambulatory Visit
